# Patient Record
Sex: Male | Born: 1935 | Race: White | Hispanic: No | Smoking: Former smoker
Health system: Southern US, Community
[De-identification: ages and names within clinical notes are randomized; demographics above are authoritative.]

## PROBLEM LIST (undated history)

## (undated) DIAGNOSIS — S72001A Fracture of unspecified part of neck of right femur, initial encounter for closed fracture: Secondary | ICD-10-CM

## (undated) DIAGNOSIS — I714 Abdominal aortic aneurysm, without rupture, unspecified: Secondary | ICD-10-CM

## (undated) DIAGNOSIS — I10 Essential (primary) hypertension: Secondary | ICD-10-CM

## (undated) DIAGNOSIS — M1990 Unspecified osteoarthritis, unspecified site: Secondary | ICD-10-CM

## (undated) HISTORY — DX: Essential (primary) hypertension: I10

## (undated) HISTORY — DX: Unspecified osteoarthritis, unspecified site: M19.90

## (undated) HISTORY — DX: Abdominal aortic aneurysm, without rupture: I71.4

## (undated) HISTORY — DX: Abdominal aortic aneurysm, without rupture, unspecified: I71.40

---

## 2003-07-26 ENCOUNTER — Encounter (INDEPENDENT_AMBULATORY_CARE_PROVIDER_SITE_OTHER): Payer: Self-pay | Admitting: Specialist

## 2003-07-26 ENCOUNTER — Ambulatory Visit (HOSPITAL_COMMUNITY): Admission: RE | Admit: 2003-07-26 | Discharge: 2003-07-26 | Payer: Self-pay | Admitting: *Deleted

## 2003-09-17 ENCOUNTER — Ambulatory Visit (HOSPITAL_COMMUNITY): Admission: RE | Admit: 2003-09-17 | Discharge: 2003-09-18 | Payer: Self-pay | Admitting: *Deleted

## 2003-10-22 ENCOUNTER — Inpatient Hospital Stay (HOSPITAL_COMMUNITY): Admission: RE | Admit: 2003-10-22 | Discharge: 2003-10-29 | Payer: Self-pay | Admitting: Surgery

## 2003-10-22 ENCOUNTER — Encounter (INDEPENDENT_AMBULATORY_CARE_PROVIDER_SITE_OTHER): Payer: Self-pay | Admitting: *Deleted

## 2004-10-30 ENCOUNTER — Ambulatory Visit (HOSPITAL_COMMUNITY): Admission: RE | Admit: 2004-10-30 | Discharge: 2004-10-30 | Payer: Self-pay | Admitting: *Deleted

## 2008-09-06 ENCOUNTER — Inpatient Hospital Stay (HOSPITAL_COMMUNITY): Admission: EM | Admit: 2008-09-06 | Discharge: 2008-09-09 | Payer: Self-pay | Admitting: *Deleted

## 2008-09-07 ENCOUNTER — Encounter (INDEPENDENT_AMBULATORY_CARE_PROVIDER_SITE_OTHER): Payer: Self-pay | Admitting: *Deleted

## 2008-09-08 ENCOUNTER — Encounter (INDEPENDENT_AMBULATORY_CARE_PROVIDER_SITE_OTHER): Payer: Self-pay | Admitting: *Deleted

## 2008-09-09 ENCOUNTER — Encounter (INDEPENDENT_AMBULATORY_CARE_PROVIDER_SITE_OTHER): Payer: Self-pay | Admitting: Internal Medicine

## 2008-09-13 ENCOUNTER — Inpatient Hospital Stay (HOSPITAL_COMMUNITY): Admission: RE | Admit: 2008-09-13 | Discharge: 2008-09-22 | Payer: Self-pay | Admitting: Surgery

## 2008-09-13 ENCOUNTER — Encounter (INDEPENDENT_AMBULATORY_CARE_PROVIDER_SITE_OTHER): Payer: Self-pay | Admitting: Surgery

## 2008-10-13 ENCOUNTER — Ambulatory Visit: Payer: Self-pay | Admitting: Oncology

## 2008-10-20 LAB — COMPREHENSIVE METABOLIC PANEL
ALT: 13 U/L (ref 0–53)
AST: 18 U/L (ref 0–37)
Albumin: 3.3 g/dL — ABNORMAL LOW (ref 3.5–5.2)
Alkaline Phosphatase: 78 U/L (ref 39–117)
Calcium: 8.9 mg/dL (ref 8.4–10.5)
Chloride: 110 mEq/L (ref 96–112)
Creatinine, Ser: 1.26 mg/dL (ref 0.40–1.50)
Potassium: 4 mEq/L (ref 3.5–5.3)

## 2008-10-20 LAB — CBC WITH DIFFERENTIAL/PLATELET
BASO%: 0.8 % (ref 0.0–2.0)
EOS%: 4.2 % (ref 0.0–7.0)
MCH: 25 pg — ABNORMAL LOW (ref 27.2–33.4)
MCHC: 32.5 g/dL (ref 32.0–36.0)
RBC: 3.91 10*6/uL — ABNORMAL LOW (ref 4.20–5.82)
RDW: 24.7 % — ABNORMAL HIGH (ref 11.0–14.6)
lymph#: 2.6 10*3/uL (ref 0.9–3.3)

## 2009-02-11 ENCOUNTER — Ambulatory Visit: Payer: Self-pay | Admitting: Oncology

## 2009-02-15 ENCOUNTER — Ambulatory Visit (HOSPITAL_COMMUNITY): Admission: RE | Admit: 2009-02-15 | Discharge: 2009-02-15 | Payer: Self-pay | Admitting: Oncology

## 2009-02-15 LAB — CBC WITH DIFFERENTIAL/PLATELET
BASO%: 0.5 % (ref 0.0–2.0)
EOS%: 3.5 % (ref 0.0–7.0)
HCT: 41 % (ref 38.4–49.9)
LYMPH%: 41.7 % (ref 14.0–49.0)
MCH: 29.7 pg (ref 27.2–33.4)
MCHC: 33.7 g/dL (ref 32.0–36.0)
NEUT%: 45.8 % (ref 39.0–75.0)
Platelets: 259 10*3/uL (ref 140–400)
RBC: 4.65 10*6/uL (ref 4.20–5.82)
lymph#: 2.4 10*3/uL (ref 0.9–3.3)

## 2009-02-15 LAB — IRON AND TIBC
%SAT: 30 % (ref 20–55)
TIBC: 359 ug/dL (ref 215–435)

## 2009-02-15 LAB — COMPREHENSIVE METABOLIC PANEL
ALT: 14 U/L (ref 0–53)
AST: 19 U/L (ref 0–37)
Creatinine, Ser: 1.52 mg/dL — ABNORMAL HIGH (ref 0.40–1.50)
Sodium: 142 mEq/L (ref 135–145)
Total Bilirubin: 1 mg/dL (ref 0.3–1.2)
Total Protein: 7 g/dL (ref 6.0–8.3)

## 2009-02-15 LAB — FERRITIN: Ferritin: 38 ng/mL (ref 22–322)

## 2009-08-22 ENCOUNTER — Ambulatory Visit: Payer: Self-pay | Admitting: Oncology

## 2009-08-25 ENCOUNTER — Ambulatory Visit (HOSPITAL_COMMUNITY): Admission: RE | Admit: 2009-08-25 | Discharge: 2009-08-25 | Payer: Self-pay | Admitting: Oncology

## 2009-08-25 LAB — COMPREHENSIVE METABOLIC PANEL
ALT: 20 U/L (ref 0–53)
AST: 24 U/L (ref 0–37)
CO2: 28 mEq/L (ref 19–32)
Calcium: 9.5 mg/dL (ref 8.4–10.5)
Chloride: 109 mEq/L (ref 96–112)
Potassium: 5 mEq/L (ref 3.5–5.3)
Sodium: 141 mEq/L (ref 135–145)
Total Protein: 7.1 g/dL (ref 6.0–8.3)

## 2009-08-25 LAB — CBC WITH DIFFERENTIAL/PLATELET
BASO%: 0.7 % (ref 0.0–2.0)
EOS%: 4.5 % (ref 0.0–7.0)
HCT: 39.3 % (ref 38.4–49.9)
MCH: 30.9 pg (ref 27.2–33.4)
MCHC: 33.3 g/dL (ref 32.0–36.0)
MONO#: 0.5 10*3/uL (ref 0.1–0.9)
RBC: 4.24 10*6/uL (ref 4.20–5.82)
RDW: 13.3 % (ref 11.0–14.6)
WBC: 6 10*3/uL (ref 4.0–10.3)
lymph#: 2.2 10*3/uL (ref 0.9–3.3)

## 2009-08-25 LAB — LACTATE DEHYDROGENASE: LDH: 126 U/L (ref 94–250)

## 2010-03-06 ENCOUNTER — Ambulatory Visit: Payer: Self-pay | Admitting: Oncology

## 2010-03-06 ENCOUNTER — Ambulatory Visit (HOSPITAL_COMMUNITY): Admission: RE | Admit: 2010-03-06 | Discharge: 2010-03-06 | Payer: Self-pay | Admitting: Oncology

## 2010-03-06 LAB — CBC WITH DIFFERENTIAL/PLATELET
BASO%: 0.4 % (ref 0.0–2.0)
Basophils Absolute: 0 10*3/uL (ref 0.0–0.1)
Eosinophils Absolute: 0.3 10*3/uL (ref 0.0–0.5)
HCT: 38.3 % — ABNORMAL LOW (ref 38.4–49.9)
HGB: 12.4 g/dL — ABNORMAL LOW (ref 13.0–17.1)
LYMPH%: 31.2 % (ref 14.0–49.0)
MONO#: 0.5 10*3/uL (ref 0.1–0.9)
NEUT#: 3.2 10*3/uL (ref 1.5–6.5)
NEUT%: 55.4 % (ref 39.0–75.0)
Platelets: 260 10*3/uL (ref 140–400)
WBC: 5.7 10*3/uL (ref 4.0–10.3)
lymph#: 1.8 10*3/uL (ref 0.9–3.3)

## 2010-03-06 LAB — COMPREHENSIVE METABOLIC PANEL
ALT: 16 U/L (ref 0–53)
BUN: 25 mg/dL — ABNORMAL HIGH (ref 6–23)
CO2: 22 mEq/L (ref 19–32)
Calcium: 9.1 mg/dL (ref 8.4–10.5)
Chloride: 113 mEq/L — ABNORMAL HIGH (ref 96–112)
Creatinine, Ser: 1.53 mg/dL — ABNORMAL HIGH (ref 0.40–1.50)
Glucose, Bld: 103 mg/dL — ABNORMAL HIGH (ref 70–99)
Total Bilirubin: 0.5 mg/dL (ref 0.3–1.2)

## 2010-03-06 LAB — CEA: CEA: 1.7 ng/mL (ref 0.0–5.0)

## 2010-03-06 LAB — LACTATE DEHYDROGENASE: LDH: 115 U/L (ref 94–250)

## 2010-09-10 ENCOUNTER — Encounter: Payer: Self-pay | Admitting: Oncology

## 2010-09-14 ENCOUNTER — Ambulatory Visit: Payer: Self-pay | Admitting: Oncology

## 2010-09-18 ENCOUNTER — Other Ambulatory Visit: Payer: Self-pay | Admitting: Oncology

## 2010-09-18 ENCOUNTER — Ambulatory Visit (HOSPITAL_COMMUNITY)
Admission: RE | Admit: 2010-09-18 | Discharge: 2010-09-18 | Payer: Self-pay | Source: Home / Self Care | Attending: Oncology | Admitting: Oncology

## 2010-09-18 DIAGNOSIS — C20 Malignant neoplasm of rectum: Secondary | ICD-10-CM

## 2010-09-18 LAB — CBC WITH DIFFERENTIAL/PLATELET
Basophils Absolute: 0 10*3/uL (ref 0.0–0.1)
EOS%: 3.7 % (ref 0.0–7.0)
Eosinophils Absolute: 0.2 10*3/uL (ref 0.0–0.5)
LYMPH%: 32.9 % (ref 14.0–49.0)
MCH: 30.8 pg (ref 27.2–33.4)
MCV: 92 fL (ref 79.3–98.0)
MONO%: 7.7 % (ref 0.0–14.0)
NEUT#: 3.2 10*3/uL (ref 1.5–6.5)
Platelets: 253 10*3/uL (ref 140–400)
RBC: 4.2 10*6/uL (ref 4.20–5.82)
RDW: 12.9 % (ref 11.0–14.6)

## 2010-09-18 LAB — COMPREHENSIVE METABOLIC PANEL
Alkaline Phosphatase: 85 U/L (ref 39–117)
CO2: 24 mEq/L (ref 19–32)
Creatinine, Ser: 1.48 mg/dL (ref 0.40–1.50)
Glucose, Bld: 105 mg/dL — ABNORMAL HIGH (ref 70–99)
Sodium: 142 mEq/L (ref 135–145)
Total Bilirubin: 0.6 mg/dL (ref 0.3–1.2)

## 2010-09-18 LAB — LACTATE DEHYDROGENASE: LDH: 124 U/L (ref 94–250)

## 2010-12-04 LAB — CROSSMATCH
Antibody Screen: NEGATIVE
Antibody Screen: NEGATIVE

## 2010-12-04 LAB — CBC
HCT: 19.6 % — ABNORMAL LOW (ref 39.0–52.0)
HCT: 28.2 % — ABNORMAL LOW (ref 39.0–52.0)
HCT: 34.8 % — ABNORMAL LOW (ref 39.0–52.0)
Hemoglobin: 6 g/dL — CL (ref 13.0–17.0)
Hemoglobin: 11 g/dL — ABNORMAL LOW (ref 13.0–17.0)
Hemoglobin: 11.2 g/dL — ABNORMAL LOW (ref 13.0–17.0)
Hemoglobin: 9.6 g/dL — ABNORMAL LOW (ref 13.0–17.0)
MCHC: 32.1 g/dL (ref 30.0–36.0)
MCHC: 31.5 g/dL (ref 30.0–36.0)
MCHC: 31.5 g/dL (ref 30.0–36.0)
MCV: 74 fL — ABNORMAL LOW (ref 78.0–100.0)
MCV: 74.6 fL — ABNORMAL LOW (ref 78.0–100.0)
MCV: 75.4 fL — ABNORMAL LOW (ref 78.0–100.0)
MCV: 76 fL — ABNORMAL LOW (ref 78.0–100.0)
Platelets: 335 10*3/uL (ref 150–400)
Platelets: 349 10*3/uL (ref 150–400)
Platelets: 375 10*3/uL (ref 150–400)
Platelets: 325 10*3/uL (ref 150–400)
Platelets: 405 10*3/uL — ABNORMAL HIGH (ref 150–400)
RBC: 3.91 MIL/uL — ABNORMAL LOW (ref 4.22–5.81)
RBC: 4.01 MIL/uL — ABNORMAL LOW (ref 4.22–5.81)
RBC: 4.64 MIL/uL (ref 4.22–5.81)
RDW: 22.7 % — ABNORMAL HIGH (ref 11.5–15.5)
RDW: 23 % — ABNORMAL HIGH (ref 11.5–15.5)
RDW: 25.3 % — ABNORMAL HIGH (ref 11.5–15.5)
WBC: 5.6 10*3/uL (ref 4.0–10.5)
WBC: 5.6 10*3/uL (ref 4.0–10.5)
WBC: 5.9 10*3/uL (ref 4.0–10.5)
WBC: 6.2 10*3/uL (ref 4.0–10.5)
WBC: 7.9 10*3/uL (ref 4.0–10.5)
WBC: 9.4 10*3/uL (ref 4.0–10.5)

## 2010-12-04 LAB — COMPREHENSIVE METABOLIC PANEL
Alkaline Phosphatase: 92 U/L (ref 39–117)
BUN: 23 mg/dL (ref 6–23)
CO2: 22 mEq/L (ref 19–32)
Chloride: 110 mEq/L (ref 96–112)
GFR calc non Af Amer: 41 mL/min — ABNORMAL LOW (ref >60)
Glucose, Bld: 106 mg/dL — ABNORMAL HIGH (ref 70–99)
Potassium: 4.6 mEq/L (ref 3.5–5.1)
Total Bilirubin: 0.3 mg/dL (ref 0.3–1.2)

## 2010-12-04 LAB — BASIC METABOLIC PANEL
BUN: 11 mg/dL (ref 6–23)
BUN: 18 mg/dL (ref 6–23)
BUN: 21 mg/dL (ref 6–23)
BUN: 11 mg/dL (ref 6–23)
BUN: 15 mg/dL (ref 6–23)
CO2: 26 mEq/L (ref 19–32)
CO2: 24 mEq/L (ref 19–32)
CO2: 24 mEq/L (ref 19–32)
Calcium: 8.8 mg/dL (ref 8.4–10.5)
Calcium: 8.9 mg/dL (ref 8.4–10.5)
Calcium: 9.3 mg/dL (ref 8.4–10.5)
Chloride: 106 mEq/L (ref 96–112)
Chloride: 109 mEq/L (ref 96–112)
Chloride: 107 mEq/L (ref 96–112)
Chloride: 107 mEq/L (ref 96–112)
Creatinine, Ser: 1.39 mg/dL (ref 0.4–1.5)
Creatinine, Ser: 1.57 mg/dL — ABNORMAL HIGH (ref 0.4–1.5)
Creatinine, Ser: 1.36 mg/dL (ref 0.4–1.5)
Creatinine, Ser: 1.36 mg/dL (ref 0.4–1.5)
Creatinine, Ser: 1.38 mg/dL (ref 0.4–1.5)
GFR calc Af Amer: 59 mL/min — ABNORMAL LOW (ref >60)
GFR calc Af Amer: 60 mL/min — ABNORMAL LOW (ref >60)
GFR calc Af Amer: 60 mL/min — ABNORMAL LOW (ref >60)
GFR calc non Af Amer: 48 mL/min — ABNORMAL LOW (ref >60)
GFR calc non Af Amer: 50 mL/min — ABNORMAL LOW (ref >60)
GFR calc non Af Amer: 52 mL/min — ABNORMAL LOW (ref >60)
Glucose, Bld: 93 mg/dL (ref 70–99)
Glucose, Bld: 129 mg/dL — ABNORMAL HIGH (ref 70–99)
Potassium: 4 mEq/L (ref 3.5–5.1)
Potassium: 3.5 mEq/L (ref 3.5–5.1)
Sodium: 137 mEq/L (ref 135–145)
Sodium: 137 mEq/L (ref 135–145)
Sodium: 138 mEq/L (ref 135–145)

## 2010-12-04 LAB — DIFFERENTIAL
Basophils Absolute: 0.1 10*3/uL (ref 0.0–0.1)
Lymphocytes Relative: 24 % (ref 12–46)
Monocytes Relative: 7 % (ref 3–12)
Neutro Abs: 5.1 10*3/uL (ref 1.7–7.7)
Neutrophils Relative %: 65 % (ref 43–77)

## 2010-12-04 LAB — PROTIME-INR
INR: 1.1 (ref 0.00–1.49)
Prothrombin Time: 14.5 seconds (ref 11.6–15.2)

## 2010-12-04 LAB — CEA: CEA: 0.8 ng/mL (ref 0.0–5.0)

## 2010-12-04 LAB — ABO/RH: ABO/RH(D): O POS

## 2010-12-04 LAB — HEMOCCULT GUIAC POC 1CARD (OFFICE): Fecal Occult Bld: POSITIVE

## 2010-12-04 LAB — HEMOGLOBIN AND HEMATOCRIT, BLOOD: Hemoglobin: 9.2 g/dL — ABNORMAL LOW (ref 13.0–17.0)

## 2011-01-02 NOTE — H&P (Signed)
NAMEJONIEL, GRAUMANN                 ACCOUNT NO.:  [id number]   MEDICAL RECORD NO.:  [id number]          PATIENT TYPE:  INP   LOCATION:  1524                         FACILITY:  [facility]   PHYSICIAN:  Della Goo, M.D. DATE OF BIRTH:  07-21-1919   DATE OF ADMISSION:  09/06/2008  DATE OF DISCHARGE:                              HISTORY & PHYSICAL   PRIMARY CARE PHYSICIAN:  Georgiana Spinner, M.D.   CHIEF COMPLAINT:  Fatigue for 4 months.   HISTORY OF THE PRESENT ILLNESS:  This is an 77 year old male who was  seen in the office of Dr. Sabino Gasser and sent to the hospital for direct  admission after being evaluated and found to have a hemoglobin level of  6.  The patient states that he has had fatigue, dyspnea on exertion for  the past 4 months and also reports having spells of diarrhea off and on  lasting 3 days at a time since Thanksgiving.  The patient does report  having lightheadedness.  He denies having any nausea or vomiting.  He  reports he is also had bouts of constipation on between.  The patient  also states that he has had good health and been in good condition,  walking several miles daily up until 4 months ago.   PAST MEDICAL HISTORY:  The patient has a history of hearing impairment  bilaterally and wears hearing aids.  Also he has a history of colon  polyps status post colon resection and removal of polyps; this was  performed and March 2005.   MEDICATIONS:  Medications at this time include lisinopril 10 mg 1 by  mouth every day.   ALLERGIES:  No known drug allergies.   SOCIAL HISTORY:  The patient is a nonsmoker and he rarely drinks  alcohol.   FAMILY HISTORY:  The family history is noncontributory.   REVIEW OF SYSTEMS:  The review of systems pertinents are mentioned  above.  The patient denies having any chest pain or shortness of breath.  He denies having any dysuria.  He cannot comment on the color and/or  consistency of his stools, but does report that they have  been loose and  diarrhea.  The patient denies having any pain with his loose stools.   PHYSICAL EXAMINATION:  GENERAL APPEARANCE:  On physical examination  findings this is an 77 year old well-nourished, well-developed male in  no discomfort or acute distress.  VITAL SIGNS:  The patient's vital signs are stable.  He is afebrile.  HEENT EXAMINATION:  Normocephalic and atraumatic head.  Pupils are  equally round and react to light.  Extraocular movements are intact.  Funduscopic is benign.  Oropharynx is clear.  NECK:  The neck is supple with full range of motion.  No thyromegaly,  adenopathy or jugulovenous distention.  HEART:  Cardiovascular - regular rate and rhythm.  No murmurs, gallops  or rubs.  LUNGS:  The lungs are clear to auscultation bilaterally.  ABDOMEN:  The abdomen has positive bowel sounds.  It is soft, nontender  and nondistended.  No hepatosplenomegaly.  No guarding.  EXTREMITIES:  The  extremities are without cyanosis, clubbing or edema.  NEUROLOGIC:  On neurological examination there is mild generalized  weakness, but otherwise nonfocal, except for the hearing impairment.   ASSESSMENT:  The patient is an 77 year old male being admitted with:  1. Anemia possibly secondary to an occult bleed or due to passage of      melena intermittently.  2. Dyspnea on exertion.  3. Fatigue.   PLAN:  1. The patient will be admitted and placed on IV fluids, and      transfused 3 units of packed red blood cells; 2 units will also be      placed on hold.  2. Admission lab studies will be performed, which include a full CBC,      CMET and coag studies.  3. The patient will be placed on hemoglobin checks every 6 hours times      48 hours.  4. The patient will be placed on clear liquids for now and IV fluids      for fluid resuscitation.  5. The patient will be seen by Dr. Sabino Gasser for further evaluation.  6. The patient will be placed on GI prophylaxis of IV Protonix and      SCDs  have been ordered for DVT prophylaxis      Della Goo, M.D.  Electronically Signed     HJ/MEDQ  D:  09/07/2008  T:  09/08/2008  Job:  [id number]

## 2011-01-02 NOTE — Op Note (Signed)
Garrett Snow, Garrett Snow                 ACCOUNT NO.:  [id number]   MEDICAL RECORD NO.:  [id number]          PATIENT TYPE:  INP   LOCATION:  1524                         FACILITY:  [facility]   PHYSICIAN:  Georgiana Spinner, M.D.    DATE OF BIRTH:  [birth year]   DATE OF PROCEDURE:  09/07/2008  DATE OF DISCHARGE:                               OPERATIVE REPORT   PROCEDURE:  Upper endoscopy.   INDICATIONS:  GI bleeding with hypochromic, macrocytic anemia.   ANESTHESIA:  Fentanyl 50 mcg, Versed 5 mg.   PROCEDURE:  With the patient mildly sedated in the left lateral  decubitus position, the Pentax videoscopic endoscope was inserted in the  mouth and passed under direct vision through the esophagus which  appeared normal into the stomach.  Fundus, body, antrum appeared normal.  Duodenal bulb was entered and as we insufflated this area there was a  single AVM seen.  We were able to advance past this into the second  portion of duodenum where on pullback we noticed numerous white  irregular shaped ulcers, some linear, all with white bases, no markings  of recent bleed.  The endoscope was pulled back into stomach.  Biopsy  taken.  Endoscope was placed in retroflexion to view the stomach from  below.  The endoscope was then straightened and withdrawn, taking  circumferential views of gastric and esophageal mucosa.  The patient's  vital signs, pulse oximeter remained stable.  The patient tolerated  procedure well without apparent complications.   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  Await biopsy report.  The patient on PPI therapy and has been transfused.  Await clinical  response.  Will proceed to colonoscopy because of the patient's history  of having had resection of a benign polyp with high-grade dysplasia not  quite 5 years ago.           ______________________________  Georgiana Spinner, M.D.     GMO/MEDQ  D:   09/07/2008  T:  09/07/2008  Job:  [id number]

## 2011-01-02 NOTE — Op Note (Signed)
NAMEARSHAD, OBERHOLZER                 ACCOUNT NO.:  [id number]   MEDICAL RECORD NO.:  [id number]          PATIENT TYPE:  INP   LOCATION:  1524                         [facility]ACILITY[facility]  [facility]   PHYSICIAN:  Georgiana Spinner, M.D.    DATE OF BIRTH:  [birth year]   DATE OF PROCEDURE:  DATE OF DISCHARGE:                               OPERATIVE REPORT   PROCEDURE:  Colonoscopy.   INDICATIONS:  Rectal bleeding.   ANESTHESIA:  Fentanyl 40 mcg, Versed 4 mg.   PROCEDURE IN DETAIL:  With the patient mildly sedated in the left  lateral decubitus position the Pentax videoscopic pediatric colonoscope  was inserted in the rectum after normal rectal exam and passed under  direct vision to the ascending colon/cecum.  It appeared that we reached  the cecum and there was a large mass in the cecum which was felt to most  likely be a cancer.  This was photographed and multiple biopsies were  taken and I injected 2 mL of [place] ink adjacent to the site to localize  it in for potential surgical extirpation.  From this point the  colonoscope was slowly withdrawn taking circumferential views of colonic  mucosa stopping only in the rectum which appeared normal on direct,  showed hemorrhoids on retroflexed view.  The endoscope was straightened  and withdrawn.  The patient's vital signs, pulse oximeter remained  stable.  The patient tolerated the procedure well without apparent  complications.   FINDINGS:  Probable cancer of ascending colon/cecum biopsied and  tattooed, await biopsy report.  The patient will follow up with me and  we will call surgery for their evaluation.           ______________________________  Georgiana Spinner, M.D.     GMO/MEDQ  D:  09/08/2008  T:  09/08/2008  Job:  [id number]

## 2011-01-02 NOTE — Op Note (Signed)
NAMENUMAIR, MASDEN                 ACCOUNT NO.:  [id number]   MEDICAL RECORD NO.:  [id number]          PATIENT TYPE:  INP   LOCATION:  2603                         FACILITY:  [facility]   PHYSICIAN:  Currie Paris, M.D.DATE OF BIRTH:  [birth year]   DATE OF PROCEDURE:  09/13/2008  DATE OF DISCHARGE:                               OPERATIVE REPORT   PREOPERATIVE DIAGNOSES:  1. Carcinoma of the ascending colon.  2. Small incisional hernias.   POSTOPERATIVE DIAGNOSES:  1. Carcinoma of the ascending colon.  2. Small incisional hernias.   OPERATION:  Right hemicolectomy with closure incisional hernias.   SURGEON:  Currie Paris, MD   ASSISTANT:  Juanetta Gosling, MD   ANESTHESIA:  General.   CLINICAL HISTORY:  This is an 77 year old gentleman who underwent a  transverse colectomy about 5 years ago for a large transverse colon  polyp that was benign but with dysplasia.  He now presented with anemia  and was found to have a large cecal carcinoma.  There was some apparent  obstruction of the appendix as that was quite dilated.  CT scan also  showed a loop of thickened small bowel of uncertain etiology.  On  physical, he had a couple of small asymptomatic incisional hernias in  the upper midline from his prior colectomy.   DESCRIPTION OF PROCEDURE:  The patient was seen in the holding area and  we confirmed the plans for the procedure as noted above.  He was taken  to the operating room after satisfactory general endotracheal anesthesia  had been obtained.  The abdomen was clipped, prepped and draped.  A  Foley catheter was placed.  The time-out was done.   I used the old midline incision and extended a little bit inferiorly.  There was some omental adhesions that were taken down to the anterior  midline and some more to the sigmoid colon which was fairly redundant.   On doing this, we were able to palpate a large mass in the cecum  compatible with the tumor seen on  endoscopy.  There was also some black  ink staining from where they had tattooed the area.  It appeared to be  mobile.   I took down some more omental adhesions so we had free place to work.  I  placed a wound protector.   I began by mobilizing the hepatic flexure using either cautery or the  LigaSure.  I was able to free up the entire line of Toldt and mobilized  the colon well up into the wound.  The small bowel was tethered at the  terminal ileum and this was freed up as well.  We saw the ureter and it  was well posterior.  I could see the old anastomosis and I made plans to  divide the transverse colon just distal to the old anastomosis and the  small bowel just proximal to the ileocecal valve.  I then scored the  mesentery and divided the mesentery primarily with the LigaSure,  although larger vessels were tied with 2-0 silk in addition to the  LigaSure.  When I had the mesentery completely divided, I tacked the  antimesenteric borders of the small bowel together and then opened the  small bowel and colon and inserted the GIA and fired it.  This produced  a side-to-side anastomosis.  The common defect was closed with a TA-60  transversely and the specimen cut off.  This produced a nice patent  anastomosis with no tension.   I used new instruments and gloves.  We close the defect in the  mesentery.  I ran the small bowel twice and it appeared to be completely  normal.  I think that the thickened small bowel seen on CT must have  represented a peristaltic loop.  There was no evidence of metastatic  disease noted.   The abdomen was irrigated copiously.  I made a final check for  hemostasis and everything appeared to be dry.  The midline was closed  with a running looped 0 PDS with interrupted #1 Novafil sutures.  The  wound was irrigated and closed with staples and Telfa wicks were also  placed.   The patient tolerated the procedure well and there were no  complications.   Estimated blood loss was less than 100 mL.  All counts  were correct.      Currie Paris, M.D.  Electronically Signed     CJS/MEDQ  D:  09/13/2008  T:  09/13/2008  Job:  [id number]   cc:   Georgiana Spinner, M.D.  [facility]

## 2011-01-02 NOTE — Consult Note (Signed)
NAMELEDON, WEIHE                 ACCOUNT NO.:  [id number]   MEDICAL RECORD NO.:  [id number]          PATIENT TYPE:  INP   LOCATION:  NA                           FACILITY:  [facility]   PHYSICIAN:  Currie Paris, M.D.DATE OF BIRTH:  1919/02/23   DATE OF CONSULTATION:  09/08/2008  DATE OF DISCHARGE:                                 CONSULTATION   REASON FOR CONSULTATION:  Colon cancer.   CHIEF COMPLAINT:  Fatigue.   HISTORY OF PRESENT ILLNESS:  Mr. Katrinka Blazing was admitted to the hospital on  01/18.  He had presented to Dr. Wende Neighbors office with a history of some  intermittent diarrhea starting back in Thanksgiving, followed by fatigue  and dyspnea on exertion and some lightheadedness.  He was having no  nausea or vomiting.  He otherwise has been in stable health, walking  several miles a day until he began to get sick a few months ago.   Because of his symptoms he was admitted.  He was found to have a  hemoglobin of 6.  He has been transfused 3 units.  His hemoglobin has  come up to 9.   Currently he is asymptomatic.  He just had a colonoscopy which showed a  large lesion in his cecum.  The.  Patient is not having any pain.   PAST MEDICAL/SURGICAL HISTORY:  1. I did a transverse colectomy on him about 45 years ago for a large      benign polyp of the transverse colon.  This was I believe in      10/2003.  2. In 2006 he had a colonoscopy which was negative and that was his      last colonoscopy.   MEDICATIONS:  Lisinopril 10 mg daily.   ALLERGIES:  None.   SOCIAL HISTORY:  The patient lives alone.  He is a nonsmoker and seldom  drinks.   FAMILY HISTORY:  Family history is noted and in the chart here.   REVIEW OF SYSTEMS:  Other than what is noted above he has no other  symptoms.  He does note he has been followed by a cardiologist.   PHYSICAL EXAMINATION:  VITAL SIGNS:  Noted in the chart and not  redictated here but have basically been stable.  GENERAL:  He is alert, oriented  and but otherwise healthy-appearing.  He  seems oriented x3.  HEENT:  Head is normocephalic.  Pupils round and regular.  Nonicteric.  NECK:  Supple with no masses or thyromegaly.  LUNGS:  Normal respirations.  Clear to auscultation.  HEART:  Regular rhythm.  I do not appreciate any large loud murmurs.  ABDOMEN:  Soft and benign.  It is nontender.  There are no masses.  There is a small incisional hernia in the upper midline.  EXTREMITIES:  No cyanosis or edema.   DATA REVIEWED:  I looked over the notes in the chart etc.   IMPRESSION:  1. Ascending colon cancer.  There was no evidence clear metastases.  2. Anemia secondary to bleeding from ascending colon cancer.  3. History of polyp.  4. History  of sinus bradycardia.   PLAN:  At this point he needs a right colectomy for his colon cancer.  I  think that we should obtain a cardiac evaluation prior to surgery and he  has been seen by the cardiologist, [facility], so I have  contacted then and asked them to check on this patient.  I would like to  have a CT scan done preoperatively just to assess for other issues such  as metastases.  Assuming these are negative I think he could be  discharged and brought back for a semi-elective surgery the first of  next week.  Our first operating room time available would be Monday  (today is Wednesday afternoon).   I have I discussed this with the patient and his family.  I think they  understand the plans.  I have contacted [facility] and will contact  his primary care physicians.  I have also spoken with Dr. Virginia Rochester.   I think all questions about the plans have been answered.      Currie Paris, M.D.  Electronically Signed     CJS/MEDQ  D:  09/08/2008  T:  09/08/2008  Job:  [id number]   cc:   Georgiana Spinner, M.D.  Fax: [phone]

## 2011-01-02 NOTE — Discharge Summary (Signed)
NAMEHARLAN, Garrett Snow                 ACCOUNT NO.:  [id number]   MEDICAL RECORD NO.:  [id number]          PATIENT TYPE:  INP   LOCATION:  1422                         FACILITY:  [facility]   PHYSICIAN:  Hillery Aldo, M.D.   DATE OF BIRTH:  20-Mar-1919   DATE OF ADMISSION:  09/06/2008  DATE OF DISCHARGE:  09/09/2008                               DISCHARGE SUMMARY   PRIMARY CARE PHYSICIAN:  Dr. Sabino Gasser.   SURGEON:  Dr. Jamey Ripa.   DISCHARGE DIAGNOSES:  1. 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  2. 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  3. Hypertension.  4. Hearing impairment.  5. Chronic renal insufficiency.  6. Asymptomatic bradycardia.  7. Chronic obstructive pulmonary disease by radiographic criteria.  8. Nonobstructive left renal calculus.  9. Duodenal ulcer.   DISCHARGE MEDICATIONS:  1. Lisinopril 10 mg daily.  2. Discharge medication Protonix 40 mg daily.   CONSULTATIONS:  1. Dr. Virginia Rochester of gastroenterology  2. Dr. Jamey Ripa of general surgery.   BRIEF ADMISSION HPI:  The patient is an 77 year old male who was seen by  Dr. Virginia Rochester in his office and referred to the hospital for direct admission  after being evaluated and found to have a hemoglobin of 6.  The patient  endorsed symptoms of fatigue, dyspnea on exertion, and intermittent  changes in his bowel habits including diarrhea.  The patient was  admitted for further evaluation and workup.  For the full details,  please see the dictated report done by Dr. Lovell Sheehan.   PROCEDURES AND DIAGNOSTIC STUDIES.:  1. Acute abdominal series on September 06, 2008 showed prominent small      bowel loops in the right mid abdomen, question early or partial      small bowel obstruction.  Tiny nonobstructing left renal calculus.      Probable chronic obstructive pulmonary disease.  2. Upper endoscopy on an September 07, 2008 showed a duodenal ulcers and      an.  3. Colonoscopy on September 08, 2008  showed a probable cancer of the      colon and cecum which was biopsied and tattooed.  4. CT scan of the abdomen and pelvis on September 08, 2008 showed a      large carcinoma of the cecum with an obstruction of the appendix      and some infiltration around the tumor suggesting tumor extension      beyond the bowel wall.  Thickened small bowel loop in the pelvis      which may be due to metastasis or inflammatory bowel disease.  5. A two-dimensional echocardiogram on September 09, 2008:  Results      pending but will be reviewed by Dr. Patty Sermons or Dr. Elsie Lincoln prior      to surgery.   DISCHARGE LABORATORY VALUES:  Sodium was 140, potassium 3.8, chloride  111, bicarb 23, BUN 11, creatinine 1.39, glucose 85.  White blood cell  count was 5.6, hemoglobin 9.3, hematocrit 29.2, platelets 335,000.   HOSPITAL COURSE:  1.  Chronic blood loss anemia secondary to cecal mass:  The patient was      admitted and serial hemoglobins were checked.  The patient      underwent evaluation with upper and lower endoscopies and was noted      to have duodenal ulcers as well as a large cecal mass.  The      patient's chronic blood loss was addressed with transfusion of 4      units of packed red blood cells.  Plan is to complete a right      colectomy on September 13, 2008.  He was put on Protonix for      treatment of his duodenal ulcers as well.  The patient is      hemodynamically stable and will be discharged with plans to readmit      him on September 13, 2008 for elective right colectomy for treatment      of his cecal carcinoma.  2. Hypertension:  The patient's lisinopril was initially held due to      hypotension.  Once he was hemodynamically stable, the lisinopril      was restarted when his blood pressure became suboptimal.  3. Chronic renal insufficiency:  The patient does have chronic renal      insufficiency and ongoing blood pressure control was recommended.  4. Asymptomatic bradycardia:  The patient  has been evaluated by      cardiology in the past.  He has seen Dr. Patty Sermons and his records      were obtained.  A 12-lead EKG done in the hospital did show sinus      bradycardia with right bundle branch block and a left anterior      fascicular block.  A 12-lead EKG done during this hospital stay was      compared with EKGs done on prior evaluations and they were not      found to have any significant differences.  Nevertheless,      cardiology was consulted for preoperative clearance.  The patient      was evaluated with a two-dimensional echocardiogram.  The results      of this study are currently pending.  The patient is not on any      rate controlling medications and is asymptomatic.   DISPOSITION:  The patient is medically stable and will be discharged  home.  He is instructed to return to [facility] a 6:30 a.m. for  a planned surgical intervention at 7:30 a.m. and to not eat or drink  anything after midnight on September 12, 2008.   Time spent coordinating care for discharge and discharge instructions  equals 35 minutes.      Hillery Aldo, M.D.  Electronically Signed     CR/MEDQ  D:  09/09/2008  T:  09/09/2008  Job:  [id number]   cc:   Georgiana Spinner, M.D.  Fax: [phone]   Currie Paris, M.D.  [address], [address]  [place]  [place] [address]

## 2011-01-05 NOTE — Op Note (Signed)
NAME:  Garrett Snow, Garrett Snow                           ACCOUNT NO.:  [id number]   MEDICAL RECORD NO.:  [id number]                   PATIENT TYPE:  AMB   LOCATION:  ENDO                                 FACILITY:  [facility]   PHYSICIAN:  Georgiana Spinner, M.D.                 DATE OF BIRTH:  1919-02-28   DATE OF PROCEDURE:  07/26/2003  DATE OF DISCHARGE:                                 OPERATIVE REPORT   PROCEDURE:  Colonoscopy.   INDICATIONS:  Colon polyps, Hemoccult positivity.   ANESTHESIA:  Demerol 10 mg, Versed 0.5 mg.   DESCRIPTION OF PROCEDURE:  With the patient mildly sedated in the left  lateral decubitus position, the Olympus videoscopic colonoscope was inserted  into the rectum and passed under direct vision to the cecum, identified by  the ileocecal valve and appendiceal orifice, the former of which was  photographed.  From this point the colonoscope was slowly withdrawn taking  circumferential views of the colonic mucosa, stopping in the proximal  ascending colon, where a large polyp was seen, and photographed, and  initially we did biopsies to obtain tissue because of the size of this, this  was multi-centimeter.  Then using snare cautery technique, setting of  20/200, with the ERBE pulse generator, we snared off two large portions of  the polyp; however, there was still quite a bit of polypoid tissue  remaining, but my concern at this point was that it may be malignant and I  elected to take off large portions for pathology so that an adequate  assessment of this could be done.  Once this was accomplished, this tissue  was placed in the [facility] retrieval basket and the endoscope and the polypoid  tissue was removed as we took circumferential views of the remaining colonic  mucosa.  We encountered a polyp at 15 cm from the anal verge.  After  removing the endoscope and retrieving the tissue from the basket, we  reinserted the endoscope proximal to this, actually to the splenic flexure,  and we were able to take circumferential views of the remaining colonic  mucosa, stopping again at 15 cm from the anal verge, at which point the  large polyp was once again encountered, photographed, and was removed using  again snare cautery technique, setting of 20/200 on the ERBE argon photo  coagulator pulse generator.  Tissue was retrieved for pathology again using  the [facility] retrieval unit.  The endoscope was then after retrieving tissue  reinserted into the rectum, which appeared normal on direct and retroflexed  view.  The endoscope was straightened and withdrawn.  The patient's vital  signs remained stable.  The patient tolerated the procedure well without  apparent complications.   FINDINGS:  Two large polypoid lesions in the colon, one at 15 cm from the  anal verge, which was removed in its entirety, the other was at the proximal  transverse colon just distal to the hepatic flexure.  This was really  removed partially, quite a bit of tissue was removed and quite a bit of  tissue remains, a number of centimeters.   PLAN:  Will again have the patient hold his aspirin, see endoscopy note  regarding that, and await biopsy report.  The patient will call me for  results and will follow up as an outpatient.                                               Georgiana Spinner, M.D.    GMO/MEDQ  D:  07/26/2003  T:  07/26/2003  Job:  [id number]

## 2011-01-05 NOTE — Op Note (Signed)
NAME:  Garrett Snow, Garrett Snow                           ACCOUNT NO.:  [id number]   MEDICAL RECORD NO.:  [id number]                   PATIENT TYPE:  INP   LOCATION:  0001                                 FACILITY:  [facility]   PHYSICIAN:  Currie Paris, M.D.           DATE OF BIRTH:  02-23-1919   DATE OF PROCEDURE:  10/22/2003  DATE OF DISCHARGE:                                 OPERATIVE REPORT   CCS#:  [id number]   PREOPERATIVE DIAGNOSES:  Large transverse colon polyp with dysplasia.   POSTOPERATIVE DIAGNOSES:  Large transverse colon polyp with dysplasia.   OPERATION:  Transverse colectomy with primary anastomosis.   SURGEON:  Currie Paris, M.D.   ASSISTANT:  Jimmye Norman, M.D.   ANESTHESIA:  General endotracheal.   CLINICAL COURSE:  This patient is an 77 year old gentleman who recently was  found to have some anemia and on workup was found to have a large mid  transverse colon polyp that although was not clearly malignant did have some  atypia with high grade dysplasia. It was not though to be resectable through  the colonoscope.   DESCRIPTION OF PROCEDURE:  The patient was seen in the holding area and had  no further questions.  He had a colonoscopy earlier and tattoo of the lesion  to make sure we could find it.   He was taken to the operating room and after satisfactory general  endotracheal anesthesia had been obtained, a Foley catheter was placed and  the abdomen prepped and draped. A midline upper abdominal incision was made  going to just below the umbilicus.  The transverse colon was identified and  was markedly distended with air as was the right cecum. There was in fact a  small cecal splitting of the tenia.  The transverse colon was tethered to  the ascending colon and some omental adhesions were taken down to free this  up. The polyp was readily identified once we were able to palpate in the  area of the tattooing. I mobilized the hepatic flexure using the cautery  to  free this up so we had the entire transverse colon up where we would work on  it. The polyp itself felt fairly soft and felt that the most appropriate  thing was a simple partial colectomy to resect this section of transverse  colon.  I made a window in the mesentery and divided down towards the bottom  of the mesentery with clamps and then across and then back up so that we had  about a 5-7 cm margin on either side of the tumor. The omentum was cleaned  off of the colon at the site where we were going to divide it both  proximally and distally.  Allen then Kocher clamps were placed across  proximally and distally, the colon divided and the segment resected. We used  some spring clamps and then took the Yantis clamp off first  at the proximal  side, decompressed the air out of there and then the distal and likewise  decompressed the air.  A stapled anastomosis using the TA-60 stapler with  triangulation technique was made.  I put three sutures along the back wall,  tied them down and then fired the stapler.  The anterior wall was divided in  half with the suture and then the two remaining segments stapled closed  using a third suture for the middle of each staple line. This produced a  nice 2-3 fingerbreadth anastomosis as the colon was serially dilated from  the recent colonoscopy.  There was no bleeding and the mesentery appeared  okay and we had excellent blood supply and it all appeared healthy.  The  mesenteric defect was closed using 3-0 silk. Gloves and instruments were  changed. Brief palpation of the abdominal cavity revealed no gross  abnormalities. The sigmoid colon was noted to be quite tortuous and still  filled with some air but no evidence of any other problems.   The abdomen was closed with a running #1 PDS on the fascia. The wound was  irrigated and the skin was closed with staples.   The patient tolerated the procedure well. There were no operative  complications.   All counts were correct. The estimated blood loss was less  than 100 mL.                                               Currie Paris, M.D.    CJS/MEDQ  D:  10/22/2003  T:  10/22/2003  Job:  [id number]   cc:   Georgiana Spinner, M.D.  [address]  [place]  [place] [address]  Fax: [phone]   Maxwell Caul, M.D.  [address]  [place]  [place] [address]  Fax: [phone]

## 2011-01-05 NOTE — Cardiovascular Report (Signed)
[name], [name]                           ACCOUNT NO.:  [id number]   MEDICAL RECORD NO.:  [id number]                   PATIENT TYPE:  OIB   LOCATION:  2013                                 FACILITY:  [facility]   PHYSICIAN:  Garrett Snow, M.D.             DATE OF BIRTH:  23-Jun-1919   DATE OF PROCEDURE:  09/17/2003  DATE OF DISCHARGE:  09/18/2003                              CARDIAC CATHETERIZATION   INDICATION:  Garrett Snow is an 77 year old male patient of [name],  with a history of chronic bradycardia which is asymptomatic.  He was  recently noted to have a large colonic polyp, and he is to undergo partial  colectomy.  In preparation for that, he underwent Cardiolite scan, revealed  mild inferior wall ischemia.  He is now for cardiac catheterization to rule  out significant CAD as part of preoperative preparation.   PROCEDURES:  1. Left heart catheterization.  2. Coronary angiography.  3. Left ventriculogram.   ATTENDING:  Dr. Lenise Herald.   COMPLICATIONS:  None.   DESCRIPTION OF PROCEDURE:  Following informed consent, the patient was  brought to the cardiac cath lab.  Right and left groin are shaved, prepped,  and draped in the usual sterile fashion.  ECG monitor established.  Using  modified Seldinger technique, #6 French arterial sheath inserted in right  femoral artery.  A 6 French diagnostic catheter was used to perform  diagnostic angiography.  This reveals a long left main with no evidence of  disease.  LAD is a large vessel, courses to the apex, gives rise to two  diagonal branches.  The LAD is noted to have some mild 50% mid vessel  lesions.  First diagonal was a small vessel with 50% ostial lesion.  The  second diagonal is a small vessel with significant disease.   Left coronary artery gives rise to a medium size ramus intermedius with no  significant disease.   Left circumflex is a medium sized vessel that courses in the AV groove and  gives rise  to one obtuse marginal branch.  Circumflex is noted to be  coursing ___________, but has no high-grade stenosis.  The first OM is a  large vessel that bifurcates distally, has no significant disease.   The right coronary artery is dominant, gives rise to both PDA/posterolateral  branch.  The RCA is noted to have 50% proximal, 30% mid, and diffuse  irregular areas to the distal RCA.  PDA and posterolateral branch have no  significant disease.   Left ventriculogram reveals a preserved EF of 50% with mild anterolateral  hypokinesis.   HEMODYNAMICS:  Systemic arterial pressure 123/50, LV systemic pressure  124/63, LVEP of 7.   CONCLUSION:  1. No significant coronary artery disease.  2. Normal left ventricular systolic function with wall motion abnormalities     as noted above.  Garrett Snow, M.D.    RHM/MEDQ  D:  09/17/2003  T:  09/19/2003  Job:  [id number]   cc:   Maxwell Caul, M.D.  [address]  [place]  [place] [address]  Fax: [phone]

## 2011-01-05 NOTE — Op Note (Signed)
NAME:  HEBERT, DOOLING                           ACCOUNT NO.:  [id number]   MEDICAL RECORD NO.:  [id number]                   PATIENT TYPE:  INP   LOCATION:  0001                                 FACILITY:  [facility]   PHYSICIAN:  Georgiana Spinner, M.D.                 DATE OF BIRTH:  1919-05-15   DATE OF PROCEDURE:  DATE OF DISCHARGE:                                 OPERATIVE REPORT   PROCEDURE:  Colonoscopy with injection of polyp.   ANESTHESIA:  Demerol 20 mg, Versed 3mg .   DESCRIPTION OF PROCEDURE:  With the patient mildly sedated and in the left  lateral decubitus position, the Olympus videoscopic colonoscope was inserted  in the rectum and passed under direct vision to the ascending colon just  above the cecum.  The ileocecal valve was visualized and photographed.  From  this point,  the endoscope was slowly withdrawn, taking circumferential  views of the colonic mucosa, stopping in the proximal to mid transverse  colon where the previously noted large polyp was seen, photographed and  injected with [place] ink dye to mark the spot for surgical removal and 1 mL  was injected on either side of the polyp.  From this point the colonoscope  was then slowly withdrawn taking circumferential view of the remaining  colonic mucosa, stopping only in the rectum which appeared normal on direct  and  retroflexed view.  The endoscope was straightened and withdrawn.  The  patient's vital signs and pulse oximetry remained stable.  The patient  tolerated the procedure well without apparent complications.   FINDINGS:  Polyp as noted previously, injected for removal; otherwise  unremarkable examination.   PLAN:  The patient to have surgery later today.                                               Georgiana Spinner, M.D.    GMO/MEDQ  D:  10/22/2003  T:  10/22/2003  Job:  [id number]   cc:   Maxwell Caul, M.D.  [address]  [place]  [place] [address]  Fax: [phone]   Currie Paris, M.D.  [address]  [place]  [place] [address]  Fax: [phone]

## 2011-01-05 NOTE — Op Note (Signed)
Garrett Snow, Garrett Snow                 ACCOUNT NO.:  [id number]   MEDICAL RECORD NO.:  [id number]          PATIENT TYPE:  AMB   LOCATION:  ENDO                         FACILITY:  [facility]   PHYSICIAN:  Georgiana Spinner, M.D.    DATE OF BIRTH:  Jun 24, 1919   DATE OF PROCEDURE:  10/30/2004  DATE OF DISCHARGE:                                 OPERATIVE REPORT   PROCEDURE:  Colonoscopy.   INDICATIONS:  Follow-up of colon polyp with high-grade dysplasia resected a  year ago.   ANESTHESIA:  Demerol 30, Versed 3 mg.   PROCEDURE:  With the patient mildly sedated in the left lateral decubitus  position, the Olympus videoscopic colonoscope was inserted in the rectum,  passed under direct vision to the cecum identified by ileocecal valve and  appendiceal orifice. From this point, the colonoscope was slowly withdrawn  taking circumferential views of colonic mucosa stopping only in the rectum  which appeared normal on direct and retroflexed view. The endoscope was  straightened and withdrawn. The patient's vital signs and pulse oximeter,  remained stable. The patient tolerated procedure well without apparent  complications.   FINDINGS:  Rather unremarkable colonoscopic examination.  Of note, prostate  exam was also normal.   PLAN:  Follow-up with me will be in five years if clinically relevant at  that time or as needed.      GMO/MEDQ  D:  10/30/2004  T:  10/30/2004  Job:  [id number]   cc:   Currie Paris, M.D.  [address]  [place]  [place] [address]

## 2011-01-05 NOTE — Discharge Summary (Signed)
Garrett Snow, Garrett Snow                           ACCOUNT NO.:  [id number]   MEDICAL RECORD NO.:  [id number]                   PATIENT TYPE:  INP   LOCATION:  0364                                 FACILITY:  [facility]   PHYSICIAN:  Currie Paris, M.D.           DATE OF BIRTH:  05-08-1919   DATE OF ADMISSION:  10/22/2003  DATE OF DISCHARGE:  10/29/2003                                 DISCHARGE SUMMARY   FINAL DIAGNOSES:  1. Transverse colon polyps.  2. Hypertension.  3. Hearing loss.   PAST MEDICAL HISTORY:  Mr. Raburn is an 77 year old gentleman who was  recently found to have a fairly large transverse colon polyp. It was not  able to be resected through a colonoscope and had some dysplasia, so  operative resected was recommended.   HOSPITAL COURSE:  The patient was admitted on October 22, 2003, and taken to  the operating room. A partial colectomy taking out his transverse colon was  performed and he tolerated the procedure well.   Postoperatively, he was kept NPO for a few days. He did have a fair amount  of abdominal distention, but we think a lot of that was due to air placed  during his colonoscopy. We put him on a little bit of Reglan and he  gradually developed some bowel function pretty much on schedule. He did have  a little bit of nausea that persisted, but that also cleared up. By the  fourth postoperative day he was feeling better, passing gas, his abdomen was  soft. By the fifth postoperative day, he was continuing to improve,  distention was getting less, diet was able to increased, and Foley catheter  removed. He was kept on full liquids and on the 10th was advanced to a solid  diet which he tolerated nicely. His wound was healing and ultimately his  staples were removed on the 10th. By the 11th he was able to go home. He had  walked almost a mile around the halls the day before. He had not taken  anything for pain for three days. He was tolerating a diet with no  nausea.   LABORATORY STUDIES:  Hemoglobin 12.4, white count 7800.  Electrolytes were  all within normal limits, although a couple of glucose values while on IV  fluids were in the 150 range. Urinalysis was negative. Chest x-ray  showed some mild bronchitic changes, otherwise unremarkable. Pathology  report is not in the chart; verbal for it was benign.   The patient will be in my office next week for followup. He will do long-  term followups with Dr. Roxan Hockey.                                               Currie Paris, M.D.  CJS/MEDQ  D:  10/29/2003  T:  10/29/2003  Job:  [id number]   cc:   Maxwell Caul, M.D.  [address]  [place]  [place] [address]  Fax: [phone]   Georgiana Spinner, M.D.  [address]  [place]  [place] [address]  Fax: [phone]

## 2011-01-05 NOTE — Op Note (Signed)
NAME:  Garrett Snow, Garrett Snow                           ACCOUNT NO.:  [id number]   MEDICAL RECORD NO.:  [id number]                   PATIENT TYPE:  AMB   LOCATION:  ENDO                                 FACILITY:  [facility]   PHYSICIAN:  Georgiana Spinner, M.D.                 DATE OF BIRTH:  Sep 07, 1918   DATE OF PROCEDURE:  DATE OF DISCHARGE:                                 OPERATIVE REPORT   PROCEDURE:  Upper endoscopy.   INDICATIONS:  Hemoccult positivity.   ANESTHESIA:  Demerol 40 mg, Versed 2 mg.   DESCRIPTION OF PROCEDURE:  With the patient mildly sedated in the left  lateral decubitus position, the Olympus videoscopic endoscope was inserted  in the mouth and passed under direct vision through the esophagus, which  appeared normal, into the stomach.  The fundus, body and antrum appeared  normal.  The duodenal bulb, however, appeared to be somewhat edematous and  there was in fact an ulcer seen in the duodenal bulb surrounded by erythema  and edema.  This was photographed only.  It was viable and oozed blood on  contact.  We advanced past this to the second portion of the duodenum which  appeared normal.  From this point, the endoscope was slowly withdrawn taking  circumferential views of the duodenal mucosa until the endoscope then pulled  back into the stomach, placed in retroflexion and viewed the stomach from  below.  The endoscope was then straightened, withdrawn, taking  circumferential views of the remaining gastric and esophageal mucosa.  The  patient's vital signs and pulse oximetry remained stable.  The patient  tolerated the procedure well without apparent complication.   FINDINGS:  Duodenal ulceration with erythema; otherwise unremarkable  examination.  CLO-test biopsy was taken.   PLAN:  Await biopsy report.  Start the patient on proton pump inhibitor.  Will stop aspirin at this time and then proceed with colonoscopy as planned.     Georgiana Spinner, M.D.    GMO/MEDQ  D:  07/26/2003  T:  07/26/2003  Job:  [id number]   cc:   Maxwell Caul, M.D.  [address]  [place]  [place] [address]  Fax: [phone]

## 2011-02-26 ENCOUNTER — Other Ambulatory Visit: Payer: Self-pay | Admitting: Oncology

## 2011-02-26 ENCOUNTER — Encounter (HOSPITAL_BASED_OUTPATIENT_CLINIC_OR_DEPARTMENT_OTHER): Payer: Medicare Other | Admitting: Oncology

## 2011-02-26 ENCOUNTER — Ambulatory Visit (HOSPITAL_COMMUNITY)
Admission: RE | Admit: 2011-02-26 | Discharge: 2011-02-26 | Disposition: A | Discharge status: Home / Self Care | Payer: Medicare Other | Source: Ambulatory Visit | Attending: Oncology | Admitting: Oncology

## 2011-02-26 DIAGNOSIS — Z85038 Personal history of other malignant neoplasm of large intestine: Secondary | ICD-10-CM

## 2011-02-26 DIAGNOSIS — Q619 Cystic kidney disease, unspecified: Secondary | ICD-10-CM | POA: Insufficient documentation

## 2011-02-26 DIAGNOSIS — C180 Malignant neoplasm of cecum: Secondary | ICD-10-CM

## 2011-02-26 DIAGNOSIS — R945 Abnormal results of liver function studies: Secondary | ICD-10-CM | POA: Insufficient documentation

## 2011-02-26 DIAGNOSIS — C20 Malignant neoplasm of rectum: Secondary | ICD-10-CM

## 2011-02-26 DIAGNOSIS — Z85048 Personal history of other malignant neoplasm of rectum, rectosigmoid junction, and anus: Secondary | ICD-10-CM | POA: Insufficient documentation

## 2011-02-26 LAB — COMPREHENSIVE METABOLIC PANEL
AST: 15 U/L (ref 0–37)
Alkaline Phosphatase: 76 U/L (ref 39–117)
BUN: 27 mg/dL — ABNORMAL HIGH (ref 6–23)
Creatinine, Ser: 1.43 mg/dL — ABNORMAL HIGH (ref 0.50–1.35)
Glucose, Bld: 98 mg/dL (ref 70–99)
Potassium: 4.5 mEq/L (ref 3.5–5.3)
Total Bilirubin: 0.7 mg/dL (ref 0.3–1.2)

## 2011-02-26 LAB — CBC WITH DIFFERENTIAL/PLATELET
BASO%: 0.6 % (ref 0.0–2.0)
EOS%: 3.2 % (ref 0.0–7.0)
LYMPH%: 38.2 % (ref 14.0–49.0)
MCHC: 33.4 g/dL (ref 32.0–36.0)
MCV: 91.1 fL (ref 79.3–98.0)
MONO%: 7.9 % (ref 0.0–14.0)
Platelets: 263 10*3/uL (ref 140–400)
RBC: 4.43 10*6/uL (ref 4.20–5.82)
RDW: 13.4 % (ref 11.0–14.6)

## 2011-02-26 LAB — CEA: CEA: 1.6 ng/mL (ref 0.0–5.0)

## 2011-09-03 ENCOUNTER — Ambulatory Visit (HOSPITAL_COMMUNITY)
Admission: RE | Admit: 2011-09-03 | Discharge: 2011-09-03 | Disposition: A | Discharge status: Home / Self Care | Payer: Medicare Other | Source: Ambulatory Visit | Attending: Oncology | Admitting: Oncology

## 2011-09-03 DIAGNOSIS — I700 Atherosclerosis of aorta: Secondary | ICD-10-CM | POA: Insufficient documentation

## 2011-09-03 DIAGNOSIS — Z85038 Personal history of other malignant neoplasm of large intestine: Secondary | ICD-10-CM

## 2011-09-03 DIAGNOSIS — C189 Malignant neoplasm of colon, unspecified: Secondary | ICD-10-CM | POA: Insufficient documentation

## 2011-09-03 DIAGNOSIS — Q619 Cystic kidney disease, unspecified: Secondary | ICD-10-CM | POA: Insufficient documentation

## 2011-09-07 ENCOUNTER — Ambulatory Visit: Payer: Medicare Other

## 2011-09-07 ENCOUNTER — Ambulatory Visit (HOSPITAL_BASED_OUTPATIENT_CLINIC_OR_DEPARTMENT_OTHER): Payer: Medicare Other | Admitting: Nurse Practitioner

## 2011-09-07 ENCOUNTER — Other Ambulatory Visit: Payer: Self-pay | Admitting: *Deleted

## 2011-09-07 VITALS — BP 146/73 | HR 60 | Temp 97.6°F | Wt 187.7 lb

## 2011-09-07 DIAGNOSIS — C189 Malignant neoplasm of colon, unspecified: Secondary | ICD-10-CM

## 2011-09-07 DIAGNOSIS — C180 Malignant neoplasm of cecum: Secondary | ICD-10-CM

## 2011-09-07 DIAGNOSIS — I10 Essential (primary) hypertension: Secondary | ICD-10-CM

## 2011-09-07 LAB — CBC WITH DIFFERENTIAL/PLATELET
BASO%: 0.4 % (ref 0.0–2.0)
EOS%: 3.3 % (ref 0.0–7.0)
HCT: 40.3 % (ref 38.4–49.9)
LYMPH%: 33.1 % (ref 14.0–49.0)
MCH: 30.9 pg (ref 27.2–33.4)
MCHC: 33.9 g/dL (ref 32.0–36.0)
NEUT%: 55 % (ref 39.0–75.0)
Platelets: 271 10*3/uL (ref 140–400)
RBC: 4.43 10*6/uL (ref 4.20–5.82)
lymph#: 1.6 10*3/uL (ref 0.9–3.3)

## 2011-09-07 LAB — COMPREHENSIVE METABOLIC PANEL
ALT: 14 U/L (ref 0–53)
AST: 21 U/L (ref 0–37)
Alkaline Phosphatase: 90 U/L (ref 39–117)
Creatinine, Ser: 1.45 mg/dL — ABNORMAL HIGH (ref 0.50–1.35)
Total Bilirubin: 0.5 mg/dL (ref 0.3–1.2)

## 2011-09-07 MED ORDER — POLYSACCHARIDE IRON 150 MG PO CAPS: 150.0000 mg | ORAL_CAPSULE | Freq: Two times a day (BID) | ORAL | Status: DC

## 2011-09-07 NOTE — Progress Notes (Signed)
OFFICE PROGRESS NOTE  Interval history:  Garrett Snow is a 78 year old man with a history of a stage III colon cancer resected in January 2010. He is seen today for scheduled followup.  Garrett Snow reports that overall he feels well. He has a good appetite. He reports his weight is stable. No nausea or vomiting. Bowels moving regularly. No hematochezia or melena. No abdominal pain.   Objective: Blood pressure 146/73, pulse 60, temperature 97.6 F (36.4 C), temperature source Oral, weight 187 lb 11.2 oz (85.14 kg).  Oropharynx is without thrush or ulceration. No palpable cervical, supra-clavicular, axillary or inguinal lymph nodes. Lungs are clear. No wheezes or rales. Regular cardiac rhythm. Abdomen is soft and nontender. No organomegaly. Extremities are without edema.  Lab Results: Lab Results  Component Value Date   WBC 5.2 02/26/2011   HGB 13.5 02/26/2011   HCT 40.4 02/26/2011   MCV 91.1 02/26/2011   PLT 263 02/26/2011    Chemistry:    Chemistry      Component Value Date/Time   NA 140 02/26/2011 1144   NA 140 02/26/2011 1144   K 4.5 02/26/2011 1144   K 4.5 02/26/2011 1144   CL 108 02/26/2011 1144   CL 108 02/26/2011 1144   CO2 26 02/26/2011 1144   CO2 26 02/26/2011 1144   BUN 27* 02/26/2011 1144   BUN 27* 02/26/2011 1144   CREATININE 1.43* 02/26/2011 1144   CREATININE 1.43* 02/26/2011 1144      Component Value Date/Time   CALCIUM 9.6 02/26/2011 1144   CALCIUM 9.6 02/26/2011 1144   ALKPHOS 76 02/26/2011 1144   ALKPHOS 76 02/26/2011 1144   AST 15 02/26/2011 1144   AST 15 02/26/2011 1144   ALT 10 02/26/2011 1144   ALT 10 02/26/2011 1144   BILITOT 0.7 02/26/2011 1144   BILITOT 0.7 02/26/2011 1144       Studies/Results: US Abdomen Complete  09/03/2011  *RADIOLOGY REPORT*  Clinical Data:  Colon cancer.  COMPLETE ABDOMINAL ULTRASOUND  Comparison:  02/26/2011  Findings:  Gallbladder:  No gallstones, gallbladder wall thickening, or pericholecystic fluid.  Common bile duct:   Normal caliber, 4 mm.  Liver:  No focal lesion  identified.  Within normal limits in parenchymal echogenicity.  IVC:  Appears normal.  Pancreas:  Limited visualization due to overlying bowel gas.  No focal abnormality in the visualized portions of the pancreas.  Spleen:  Scattered calcifications compatible with old granulomatous disease.  Normal size.  Right Kidney:   11 mm mid pole cyst.  No hydronephrosis.  Normal echotexture.  Left Kidney:  2.5 cm lower pole cyst.  No hydronephrosis.  Normal echotexture.  Abdominal aorta:  Atherosclerotic calcifications.  Maximum diameter 2.9 cm proximally.  IMPRESSION: No focal liver lesions visualized to suggest metastatic disease.  Bilateral renal cysts.  Calcified granulomas in the spleen.  Original Report Authenticated By: Cyndie Chime, M.D.    Medications: I have reviewed the patient's current medications.  Assessment/Plan: 1.  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  Normal preoperative CEA.  Abdominal ultrasound 09/03/2011 negative for evidence of metastatic disease. 2.  Hypertension-he reports a recent change in his blood pressure medication. We will contact his pharmacy for the name of the medication. 3.  Disposition - Garrett Snow appears stable.  He will return to our lab today for a CBC, chemistry panel and CEA. He will return for labs, abdominal ultrasound and a followup followup visit in 6 months.  He will contact the office in the interim with any problems.   Lonna Cobb ANP/GNP-BC

## 2012-03-07 ENCOUNTER — Ambulatory Visit (HOSPITAL_BASED_OUTPATIENT_CLINIC_OR_DEPARTMENT_OTHER): Payer: Medicare Other | Admitting: Oncology

## 2012-03-07 ENCOUNTER — Encounter: Payer: Self-pay | Admitting: Oncology

## 2012-03-07 ENCOUNTER — Telehealth: Payer: Self-pay | Admitting: Oncology

## 2012-03-07 ENCOUNTER — Ambulatory Visit (HOSPITAL_COMMUNITY)
Admission: RE | Admit: 2012-03-07 | Discharge: 2012-03-07 | Disposition: A | Discharge status: Home / Self Care | Payer: Medicare Other | Source: Ambulatory Visit | Attending: Nurse Practitioner | Admitting: Nurse Practitioner

## 2012-03-07 ENCOUNTER — Other Ambulatory Visit (HOSPITAL_BASED_OUTPATIENT_CLINIC_OR_DEPARTMENT_OTHER): Payer: Medicare Other | Admitting: Lab

## 2012-03-07 ENCOUNTER — Other Ambulatory Visit (HOSPITAL_COMMUNITY): Payer: Medicare Other

## 2012-03-07 VITALS — BP 152/70 | HR 52 | Temp 97.1°F | Wt 180.0 lb

## 2012-03-07 DIAGNOSIS — C189 Malignant neoplasm of colon, unspecified: Secondary | ICD-10-CM | POA: Insufficient documentation

## 2012-03-07 DIAGNOSIS — Z85038 Personal history of other malignant neoplasm of large intestine: Secondary | ICD-10-CM

## 2012-03-07 DIAGNOSIS — M1990 Unspecified osteoarthritis, unspecified site: Secondary | ICD-10-CM

## 2012-03-07 DIAGNOSIS — Q619 Cystic kidney disease, unspecified: Secondary | ICD-10-CM | POA: Insufficient documentation

## 2012-03-07 DIAGNOSIS — I10 Essential (primary) hypertension: Secondary | ICD-10-CM

## 2012-03-07 DIAGNOSIS — I700 Atherosclerosis of aorta: Secondary | ICD-10-CM | POA: Insufficient documentation

## 2012-03-07 HISTORY — DX: Unspecified osteoarthritis, unspecified site: M19.90

## 2012-03-07 HISTORY — DX: Essential (primary) hypertension: I10

## 2012-03-07 LAB — CBC WITH DIFFERENTIAL/PLATELET
Basophils Absolute: 0.1 10*3/uL (ref 0.0–0.1)
EOS%: 5.5 % (ref 0.0–7.0)
HGB: 13.5 g/dL (ref 13.0–17.1)
MCH: 30.4 pg (ref 27.2–33.4)
NEUT#: 2.2 10*3/uL (ref 1.5–6.5)
RDW: 13.7 % (ref 11.0–14.6)
lymph#: 1.7 10*3/uL (ref 0.9–3.3)

## 2012-03-07 LAB — COMPREHENSIVE METABOLIC PANEL
AST: 16 U/L (ref 0–37)
Albumin: 4.1 g/dL (ref 3.5–5.2)
BUN: 27 mg/dL — ABNORMAL HIGH (ref 6–23)
CO2: 25 mEq/L (ref 19–32)
Calcium: 9.3 mg/dL (ref 8.4–10.5)
Chloride: 110 mEq/L (ref 96–112)
Glucose, Bld: 103 mg/dL — ABNORMAL HIGH (ref 70–99)
Potassium: 4.6 mEq/L (ref 3.5–5.3)

## 2012-03-07 LAB — CEA: CEA: 1.7 ng/mL (ref 0.0–5.0)

## 2012-03-07 NOTE — Telephone Encounter (Signed)
gv pt appt schedule for July 2014 including [place] for 03/06/2013.

## 2012-03-07 NOTE — Progress Notes (Signed)
Hematology and Oncology Follow Up Visit  Garrett Snow [id number] 13-May-1919 78 y.o. 03/07/2012 2:44 PM   Principle Diagnosis: Encounter Diagnoses  Name Primary?  . History of colon cancer, stage III   . 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 He had a T4 N1 lesion. Normal preoperative CEA. After full discussion with him and his family I elected not to put him on any chemotherapy. I have been following him with every 6 month ultrasounds of his liver as well as medical visits and lab tests. There have been no signs for any new disease now out 3-1/2 years. He continues to do well. Most of his problems relate to his old age with decreased ability to do all the things he used to do in the past due to limitations from arthritis particularly his left shoulder. He denies any abdominal pain or cramps. No change in bowel habit. No hematochezia or melena.  Medications: reviewed  Allergies: No Known Allergies  Review of Systems: Constitutional:   No constitutional symptoms Respiratory: No cough or dyspnea Cardiovascular: No chest pain or palpitations   Gastrointestinal: See above Genito-Urinary: No urinary tract symptoms  Musculoskeletal: See above Neurologic: No headache or change in vision Skin: No rash Remaining ROS negative.  Physical Exam: Blood pressure 152/70, pulse 52, temperature 97.1 F (36.2 C), temperature source Oral, weight 180 lb (81.647 kg). Wt Readings from Last 3 Encounters:  03/07/12 180 lb (81.647 kg)  09/07/11 187 lb 11.2 oz (85.14 kg)     General appearance: Thin Caucasian man HENNT: Pharynx no erythema or exudate Lymph nodes: No adenopathy  Breasts: Lungs: Clear to auscultation resonant to percussion Heart: Regular rhythm no murmur Abdomen: Soft nontender no mass no organomegaly Extremities: No edema  no calf tenderness Vascular: No cyanosis Neurologic: No focal deficit Skin: Few scattered ecchymoses on his arms  Lab Results: Lab Results  Component Value Date   WBC 4.6 03/07/2012   HGB 13.5 03/07/2012   HCT 40.8 03/07/2012   MCV 91.8 03/07/2012   PLT 253 03/07/2012     Chemistry      Component Value Date/Time   NA 141 09/07/2011 1119   K 4.7 09/07/2011 1119   CL 108 09/07/2011 1119   CO2 26 09/07/2011 1119   BUN 27* 09/07/2011 1119   CREATININE 1.45* 09/07/2011 1119      Component Value Date/Time   CALCIUM 9.4 09/07/2011 1119   ALKPHOS 90 09/07/2011 1119   AST 21 09/07/2011 1119   ALT 14 09/07/2011 1119   BILITOT 0.5 09/07/2011 1119       Radiological Studies: US Abdomen Complete  03/07/2012  *RADIOLOGY REPORT*  Clinical Data:  stage III colon cancer.  Prior colon resection.  COMPLETE ABDOMINAL ULTRASOUND  Comparison:  09/03/2011  Findings:  Gallbladder:  No gallstones, gallbladder wall thickening, or pericholecystic fluid.  Common bile duct:   Normal caliber, 6 mm.  Liver:  Normal size and echotexture.  No focal abnormality. No biliary ductal dilatation.  IVC:  Appears normal.  Pancreas:  Limited visualization due to overlying bowel gas.  Spleen:  Scattered calcifications noted compatible with old granulomatous disease.  No other focal abnormality.  Normal size.  Right Kidney:   11.9 cm.  Multiple cysts, the largest 1.7 cm in the upper pole.  No hydronephrosis.Cortical thinning.  Left Kidney:  12.7  cm.  Multiple cysts, the largest measuring 3.0 cm in the lower pole.  Mild cortical thinning.  No hydronephrosis.  Abdominal aorta:  Calcified plaque.  No aneurysm.  IMPRESSION: Mild cortical thinning within the kidneys bilaterally.  Bilateral renal cysts.  Old granulomatous disease within the spleen.  No acute findings unchanged since prior study.  Original Report Authenticated By: Cyndie Chime, M.D.    Impression and Plan: #1. Stage III one node positive CEA normal cancer of the cecum  treated with surgery alone  he remains free of any obvious recurrence now out 3-1/2 years from surgery. 3 year cancer free survival predicts five-year survival so I am comfortable decreasing frequency of visits to this office to once a year. I Advised him to see his family physician once a year as well so that he sees somebody twice a year.  #2. Essential hypertension.  #3. Chronic mild renal insufficiency.  #4. Asymptomatic calcified granulomas in the spleen.  CC:. Dr. Feliciana Rossetti.; Dr. Cyndia Bent   Levert Feinstein, MD 7/19/20132:44 PM

## 2012-03-10 ENCOUNTER — Telehealth: Payer: Self-pay | Admitting: *Deleted

## 2012-03-10 NOTE — Telephone Encounter (Signed)
Message copied by Orbie Hurst on Mon Mar 10, 2012  1:55 PM ------      Message from: Levert Feinstein      Created: Fri Mar 07, 2012  3:40 PM       Call pt US liver normal

## 2012-03-10 NOTE — Telephone Encounter (Signed)
Called patient.  He is not home at present.  Spoke with son, Michele Mcalpine and let him know that [place] of liver is normal and he will let his Dad know.  Son said patient is to return to Dr. Cyndie Chime in one year.

## 2012-10-23 ENCOUNTER — Other Ambulatory Visit: Payer: Self-pay | Admitting: Nurse Practitioner

## 2012-10-23 DIAGNOSIS — C189 Malignant neoplasm of colon, unspecified: Secondary | ICD-10-CM

## 2013-03-06 ENCOUNTER — Other Ambulatory Visit (HOSPITAL_BASED_OUTPATIENT_CLINIC_OR_DEPARTMENT_OTHER): Payer: Medicare Other | Admitting: Lab

## 2013-03-06 ENCOUNTER — Ambulatory Visit (HOSPITAL_COMMUNITY)
Admission: RE | Admit: 2013-03-06 | Discharge: 2013-03-06 | Disposition: A | Discharge status: Home / Self Care | Payer: Medicare Other | Source: Ambulatory Visit | Attending: Oncology | Admitting: Oncology

## 2013-03-06 ENCOUNTER — Ambulatory Visit (HOSPITAL_BASED_OUTPATIENT_CLINIC_OR_DEPARTMENT_OTHER): Payer: Medicare Other | Admitting: Nurse Practitioner

## 2013-03-06 ENCOUNTER — Telehealth: Payer: Self-pay | Admitting: Oncology

## 2013-03-06 VITALS — BP 134/51 | HR 44 | Temp 97.0°F | Resp 20 | Ht 73.0 in | Wt 175.0 lb

## 2013-03-06 DIAGNOSIS — C189 Malignant neoplasm of colon, unspecified: Secondary | ICD-10-CM

## 2013-03-06 DIAGNOSIS — N281 Cyst of kidney, acquired: Secondary | ICD-10-CM | POA: Insufficient documentation

## 2013-03-06 LAB — CBC WITH DIFFERENTIAL/PLATELET
Basophils Absolute: 0 10*3/uL (ref 0.0–0.1)
Eosinophils Absolute: 0.2 10*3/uL (ref 0.0–0.5)
HGB: 13.9 g/dL (ref 13.0–17.1)
LYMPH%: 42.4 % (ref 14.0–49.0)
MCV: 90.9 fL (ref 79.3–98.0)
MONO#: 0.6 10*3/uL (ref 0.1–0.9)
MONO%: 8.7 % (ref 0.0–14.0)
NEUT#: 2.9 10*3/uL (ref 1.5–6.5)
Platelets: 252 10*3/uL (ref 140–400)
RDW: 13.7 % (ref 11.0–14.6)

## 2013-03-06 LAB — LACTATE DEHYDROGENASE (CC13): LDH: 159 U/L (ref 125–245)

## 2013-03-06 LAB — COMPREHENSIVE METABOLIC PANEL (CC13)
Albumin: 3.7 g/dL (ref 3.5–5.0)
Alkaline Phosphatase: 101 U/L (ref 40–150)
BUN: 23.4 mg/dL (ref 7.0–26.0)
CO2: 25 mEq/L (ref 22–29)
Calcium: 9.8 mg/dL (ref 8.4–10.4)
Glucose: 106 mg/dl (ref 70–140)
Potassium: 4.2 mEq/L (ref 3.5–5.1)

## 2013-03-06 NOTE — Progress Notes (Signed)
OFFICE PROGRESS NOTE  Interval history:   Garrett Snow is a 79 year old man with a history of a stage III (T4 N1) colon cancer resected in January 2010. He is seen today for scheduled followup.  He overall feels well. No interim illnesses or infections. No change in bowel habits. No abdominal pain. No hematochezia or melena. He has a good appetite. He would like to be more active but is limited due to arthritis. He is able to do yard work which he enjoys.   Objective: Blood pressure 134/51, pulse 44, temperature 97 F (36.1 C), temperature source Oral, resp. rate 20, height 6\' 1"  (1.854 m), weight 175 lb (79.379 kg).  Oropharynx is without thrush or ulceration. No palpable cervical, supra-clavicular, axillary or inguinal lymph nodes. Lungs are clear. No wheezes or rales. Regular cardiac rhythm. Abdomen is soft and nontender. No organomegaly. Extremities are without edema.   Lab Results: Lab Results  Component Value Date   WBC 6.5 03/06/2013   HGB 13.9 03/06/2013   HCT 41.9 03/06/2013   MCV 90.9 03/06/2013   PLT 252 03/06/2013    Chemistry:    Chemistry      Component Value Date/Time   NA 141 03/06/2013 1025   NA 142 03/07/2012 1025   K 4.2 03/06/2013 1025   K 4.6 03/07/2012 1025   CL 110 03/07/2012 1025   CO2 25 03/06/2013 1025   CO2 25 03/07/2012 1025   BUN 23.4 03/06/2013 1025   BUN 27* 03/07/2012 1025   CREATININE 1.3 03/06/2013 1025   CREATININE 1.47* 03/07/2012 1025      Component Value Date/Time   CALCIUM 9.8 03/06/2013 1025   CALCIUM 9.3 03/07/2012 1025   ALKPHOS 101 03/06/2013 1025   ALKPHOS 92 03/07/2012 1025   AST 20 03/06/2013 1025   AST 16 03/07/2012 1025   ALT 15 03/06/2013 1025   ALT 8 03/07/2012 1025   BILITOT 0.75 03/06/2013 1025   BILITOT 0.7 03/07/2012 1025       Studies/Results: No results found.  Medications: I have reviewed the patient's current medications.  Assessment/Plan:  1. Stage III (T4 N1) 1 node positive cancer of the cecum status post surgery  09/13/2008. Normal preoperative CEA. Abdominal ultrasound 03/07/2012 negative for evidence of metastatic disease. 2. Hypertension. 3. Chronic mild renal insufficiency. 4. Asymptomatic calcified granulomas in the spleen.  Disposition-Garrett Snow appears stable. He remains in clinical remission from colon cancer. We will followup on the abdominal ultrasound from earlier today. He will return for a followup visit with labs and abdominal ultrasound in one year. He understands to contact the office prior to that visit with any problems or concerns.  Lonna Cobb ANP/GNP-BC   CC Dr. Feliciana Rossetti and Dr. Cyndia Bent.

## 2013-03-06 NOTE — Telephone Encounter (Signed)
Gave pt appt for lab and  Md on July 2015, U/S before visit

## 2013-06-11 ENCOUNTER — Ambulatory Visit: Payer: Medicare Other | Admitting: Cardiology

## 2013-08-31 ENCOUNTER — Other Ambulatory Visit: Payer: Self-pay | Admitting: *Deleted

## 2013-08-31 DIAGNOSIS — C189 Malignant neoplasm of colon, unspecified: Secondary | ICD-10-CM

## 2013-08-31 MED ORDER — POLYSACCHARIDE IRON COMPLEX 150 MG PO CAPS: ORAL_CAPSULE | ORAL | Status: AC

## 2013-08-31 NOTE — Telephone Encounter (Signed)
Notified pt's son [name] Flaim) that Fer rex re-fill has been sent per request.  Pt's son verbalized understanding and expressed appreciation.

## 2013-10-17 ENCOUNTER — Telehealth: Payer: Self-pay | Admitting: *Deleted

## 2013-10-17 ENCOUNTER — Encounter: Payer: Self-pay | Admitting: *Deleted

## 2013-10-17 NOTE — Telephone Encounter (Signed)
Former pt of DR. G...td  

## 2014-03-05 ENCOUNTER — Other Ambulatory Visit (HOSPITAL_BASED_OUTPATIENT_CLINIC_OR_DEPARTMENT_OTHER): Payer: Medicare Other

## 2014-03-05 ENCOUNTER — Ambulatory Visit: Payer: Medicare Other | Admitting: Oncology

## 2014-03-05 ENCOUNTER — Other Ambulatory Visit: Payer: Medicare Other

## 2014-03-05 ENCOUNTER — Ambulatory Visit (HOSPITAL_BASED_OUTPATIENT_CLINIC_OR_DEPARTMENT_OTHER): Payer: Medicare Other | Admitting: Oncology

## 2014-03-05 VITALS — BP 153/54 | HR 50 | Temp 97.2°F | Resp 19 | Ht 73.0 in | Wt 174.4 lb

## 2014-03-05 DIAGNOSIS — I10 Essential (primary) hypertension: Secondary | ICD-10-CM

## 2014-03-05 DIAGNOSIS — N289 Disorder of kidney and ureter, unspecified: Secondary | ICD-10-CM

## 2014-03-05 DIAGNOSIS — Z85038 Personal history of other malignant neoplasm of large intestine: Secondary | ICD-10-CM

## 2014-03-05 DIAGNOSIS — C189 Malignant neoplasm of colon, unspecified: Secondary | ICD-10-CM

## 2014-03-05 LAB — CBC WITH DIFFERENTIAL/PLATELET
BASO%: 0.6 % (ref 0.0–2.0)
BASOS ABS: 0 10*3/uL (ref 0.0–0.1)
EOS%: 5.8 % (ref 0.0–7.0)
Eosinophils Absolute: 0.3 10*3/uL (ref 0.0–0.5)
HEMATOCRIT: 40.2 % (ref 38.4–49.9)
HEMOGLOBIN: 12.9 g/dL — AB (ref 13.0–17.1)
LYMPH%: 39.2 % (ref 14.0–49.0)
MCH: 29.7 pg (ref 27.2–33.4)
MCHC: 32.2 g/dL (ref 32.0–36.0)
MCV: 92.2 fL (ref 79.3–98.0)
MONO#: 0.6 10*3/uL (ref 0.1–0.9)
MONO%: 10.7 % (ref 0.0–14.0)
NEUT#: 2.4 10*3/uL (ref 1.5–6.5)
NEUT%: 43.7 % (ref 39.0–75.0)
Platelets: 249 10*3/uL (ref 140–400)
RBC: 4.36 10*6/uL (ref 4.20–5.82)
RDW: 13.5 % (ref 11.0–14.6)
WBC: 5.5 10*3/uL (ref 4.0–10.3)
lymph#: 2.1 10*3/uL (ref 0.9–3.3)

## 2014-03-05 LAB — COMPREHENSIVE METABOLIC PANEL (CC13)
ALK PHOS: 103 U/L (ref 40–150)
ALT: 12 U/L (ref 0–55)
AST: 17 U/L (ref 5–34)
Albumin: 3.7 g/dL (ref 3.5–5.0)
Anion Gap: 7 mEq/L (ref 3–11)
BILIRUBIN TOTAL: 0.64 mg/dL (ref 0.20–1.20)
BUN: 27.2 mg/dL — AB (ref 7.0–26.0)
CALCIUM: 9.6 mg/dL (ref 8.4–10.4)
CO2: 24 mEq/L (ref 22–29)
CREATININE: 1.2 mg/dL (ref 0.7–1.3)
Chloride: 112 mEq/L — ABNORMAL HIGH (ref 98–109)
Glucose: 96 mg/dl (ref 70–140)
Potassium: 4.5 mEq/L (ref 3.5–5.1)
Sodium: 143 mEq/L (ref 136–145)
Total Protein: 6.7 g/dL (ref 6.4–8.3)

## 2014-03-05 NOTE — Progress Notes (Signed)
OFFICE PROGRESS NOTE   Principal diagnosis: 80 year old man with a history of a stage III (T4 N1) colon cancer resected in January 2010.   Prior therapy: Status post surgery 09/13/2008. He has been on active surveillance since that time.  History of present illness: This is a pleasant 80 year old gentleman presents today for a followup with his son. He used to be followed by Dr. Beryle Beams for the above diagnosis. Since his last visit he has no new complaints.He overall feels well. No interim illnesses or infections. No change in bowel habits. No abdominal pain. No hematochezia or melena. He has a good appetite. He would like to be more active but is limited due to arthritis. He is able to perform most activities of daily living without any decline. He does not report any headaches or blurry vision or syncope. Does not report any urinary symptoms her skeletal complaints. Rest of his review of systems unremarkable.   Objective: Blood pressure 153/54, pulse 50, temperature 97.2 F (36.2 C), temperature source Oral, resp. rate 19, height 6\' 1"  (1.854 m), weight 174 lb 6.4 oz (79.107 kg).  Oropharynx is without thrush or ulceration.  No palpable cervical, supra-clavicular, axillary or inguinal lymph nodes.  Lungs are clear. No wheezes or rales. Regular cardiac rhythm. A bdomen is soft and nontender. No organomegaly.  Extremities are without edema.   Lab Results: Lab Results  Component Value Date   WBC 5.5 03/05/2014   HGB 12.9* 03/05/2014   HCT 40.2 03/05/2014   MCV 92.2 03/05/2014   PLT 249 03/05/2014    Chemistry:    Chemistry      Component Value Date/Time   NA 141 03/06/2013 1025   NA 142 03/07/2012 1025   K 4.2 03/06/2013 1025   K 4.6 03/07/2012 1025   CL 110 03/07/2012 1025   CO2 25 03/06/2013 1025   CO2 25 03/07/2012 1025   BUN 23.4 03/06/2013 1025   BUN 27* 03/07/2012 1025   CREATININE 1.3 03/06/2013 1025   CREATININE 1.47* 03/07/2012 1025      Component Value Date/Time   CALCIUM 9.8 03/06/2013 1025   CALCIUM 9.3 03/07/2012 1025   ALKPHOS 101 03/06/2013 1025   ALKPHOS 92 03/07/2012 1025   AST 20 03/06/2013 1025   AST 16 03/07/2012 1025   ALT 15 03/06/2013 1025   ALT 8 03/07/2012 1025   BILITOT 0.75 03/06/2013 1025   BILITOT 0.7 03/07/2012 1025       Medications: I have reviewed the patient's current medications.  Assessment/Plan:   80 year old gentleman with the following issues:  1. Stage III (T4 N1) 1 node positive cancer of the cecum status post surgery 09/13/2008. Normal preoperative CEA. Abdominal ultrasound 03/07/2012 negative for evidence of metastatic disease. He continues to have no evidence of disease and no further oncology followup is needed at this time. He will followup with [place] as needed. 2. Hypertension. Followed by his primary care physician. 3. Chronic mild renal insufficiency. Appears to be stable.    [facility] MD

## 2014-03-06 LAB — CEA: CEA: 1.5 ng/mL (ref 0.0–5.0)

## 2015-03-02 ENCOUNTER — Encounter: Payer: Self-pay | Admitting: *Deleted

## 2015-03-29 ENCOUNTER — Encounter: Payer: Self-pay | Admitting: Cardiology

## 2015-03-30 ENCOUNTER — Encounter: Payer: Self-pay | Admitting: Cardiology

## 2017-08-14 ENCOUNTER — Inpatient Hospital Stay (HOSPITAL_COMMUNITY)
Admission: EM | Admit: 2017-08-14 | Discharge: 2017-08-17 | DRG: 470 | Disposition: A | Discharge status: Skilled Nursing Facility | Payer: Medicare Other | Attending: Internal Medicine | Admitting: Internal Medicine

## 2017-08-14 ENCOUNTER — Emergency Department (HOSPITAL_COMMUNITY): Payer: Medicare Other

## 2017-08-14 ENCOUNTER — Inpatient Hospital Stay (HOSPITAL_COMMUNITY): Payer: Medicare Other | Admitting: Anesthesiology

## 2017-08-14 ENCOUNTER — Encounter (HOSPITAL_COMMUNITY)
Admission: EM | Disposition: A | Discharge status: Skilled Nursing Facility | Payer: Self-pay | Source: Home / Self Care | Attending: Internal Medicine

## 2017-08-14 ENCOUNTER — Other Ambulatory Visit: Payer: Self-pay

## 2017-08-14 ENCOUNTER — Inpatient Hospital Stay (HOSPITAL_COMMUNITY): Payer: Medicare Other

## 2017-08-14 ENCOUNTER — Encounter (HOSPITAL_COMMUNITY): Payer: Self-pay

## 2017-08-14 DIAGNOSIS — S72091A Other fracture of head and neck of right femur, initial encounter for closed fracture: Principal | ICD-10-CM | POA: Diagnosis present

## 2017-08-14 DIAGNOSIS — N179 Acute kidney failure, unspecified: Secondary | ICD-10-CM | POA: Diagnosis not present

## 2017-08-14 DIAGNOSIS — Z8781 Personal history of (healed) traumatic fracture: Secondary | ICD-10-CM

## 2017-08-14 DIAGNOSIS — Z8679 Personal history of other diseases of the circulatory system: Secondary | ICD-10-CM | POA: Diagnosis not present

## 2017-08-14 DIAGNOSIS — W1839XA Other fall on same level, initial encounter: Secondary | ICD-10-CM | POA: Diagnosis present

## 2017-08-14 DIAGNOSIS — Y92009 Unspecified place in unspecified non-institutional (private) residence as the place of occurrence of the external cause: Secondary | ICD-10-CM

## 2017-08-14 DIAGNOSIS — N183 Chronic kidney disease, stage 3 (moderate): Secondary | ICD-10-CM | POA: Diagnosis present

## 2017-08-14 DIAGNOSIS — Z87891 Personal history of nicotine dependence: Secondary | ICD-10-CM

## 2017-08-14 DIAGNOSIS — I714 Abdominal aortic aneurysm, without rupture, unspecified: Secondary | ICD-10-CM

## 2017-08-14 DIAGNOSIS — Z01818 Encounter for other preprocedural examination: Secondary | ICD-10-CM

## 2017-08-14 DIAGNOSIS — H9190 Unspecified hearing loss, unspecified ear: Secondary | ICD-10-CM | POA: Diagnosis present

## 2017-08-14 DIAGNOSIS — S72009S Fracture of unspecified part of neck of unspecified femur, sequela: Secondary | ICD-10-CM

## 2017-08-14 DIAGNOSIS — S72009A Fracture of unspecified part of neck of unspecified femur, initial encounter for closed fracture: Secondary | ICD-10-CM

## 2017-08-14 DIAGNOSIS — Z79899 Other long term (current) drug therapy: Secondary | ICD-10-CM

## 2017-08-14 DIAGNOSIS — S72001A Fracture of unspecified part of neck of right femur, initial encounter for closed fracture: Secondary | ICD-10-CM

## 2017-08-14 DIAGNOSIS — I129 Hypertensive chronic kidney disease with stage 1 through stage 4 chronic kidney disease, or unspecified chronic kidney disease: Secondary | ICD-10-CM | POA: Diagnosis present

## 2017-08-14 DIAGNOSIS — M25551 Pain in right hip: Secondary | ICD-10-CM | POA: Diagnosis present

## 2017-08-14 DIAGNOSIS — W19XXXA Unspecified fall, initial encounter: Secondary | ICD-10-CM

## 2017-08-14 DIAGNOSIS — D62 Acute posthemorrhagic anemia: Secondary | ICD-10-CM | POA: Diagnosis not present

## 2017-08-14 DIAGNOSIS — Z66 Do not resuscitate: Secondary | ICD-10-CM | POA: Diagnosis present

## 2017-08-14 DIAGNOSIS — I10 Essential (primary) hypertension: Secondary | ICD-10-CM | POA: Diagnosis present

## 2017-08-14 DIAGNOSIS — Z9889 Other specified postprocedural states: Secondary | ICD-10-CM

## 2017-08-14 HISTORY — DX: Fracture of unspecified part of neck of right femur, initial encounter for closed fracture: S72.001A

## 2017-08-14 HISTORY — PX: HIP ARTHROPLASTY: SHX981

## 2017-08-14 LAB — CBC WITH DIFFERENTIAL/PLATELET
Basophils Absolute: 0 10*3/uL (ref 0.0–0.1)
Basophils Relative: 0 %
EOS ABS: 0.1 10*3/uL (ref 0.0–0.7)
EOS PCT: 1 %
HCT: 34.9 % — ABNORMAL LOW (ref 39.0–52.0)
Hemoglobin: 11.2 g/dL — ABNORMAL LOW (ref 13.0–17.0)
LYMPHS ABS: 1.3 10*3/uL (ref 0.7–4.0)
LYMPHS PCT: 18 %
MCH: 30.3 pg (ref 26.0–34.0)
MCHC: 32.1 g/dL (ref 30.0–36.0)
MCV: 94.3 fL (ref 78.0–100.0)
MONOS PCT: 11 %
Monocytes Absolute: 0.8 10*3/uL (ref 0.1–1.0)
Neutro Abs: 5.3 10*3/uL (ref 1.7–7.7)
Neutrophils Relative %: 70 %
PLATELETS: 282 10*3/uL (ref 150–400)
RBC: 3.7 MIL/uL — AB (ref 4.22–5.81)
RDW: 13.3 % (ref 11.5–15.5)
WBC: 7.6 10*3/uL (ref 4.0–10.5)

## 2017-08-14 LAB — COMPREHENSIVE METABOLIC PANEL
ALK PHOS: 109 U/L (ref 38–126)
ALT: 16 U/L — AB (ref 17–63)
AST: 25 U/L (ref 15–41)
Albumin: 3.5 g/dL (ref 3.5–5.0)
Anion gap: 6 (ref 5–15)
BUN: 32 mg/dL — ABNORMAL HIGH (ref 6–20)
CALCIUM: 9.3 mg/dL (ref 8.9–10.3)
CHLORIDE: 111 mmol/L (ref 101–111)
CO2: 23 mmol/L (ref 22–32)
CREATININE: 1.4 mg/dL — AB (ref 0.61–1.24)
GFR, EST AFRICAN AMERICAN: 47 mL/min — AB (ref >60)
GFR, EST NON AFRICAN AMERICAN: 41 mL/min — AB (ref >60)
Glucose, Bld: 109 mg/dL — ABNORMAL HIGH (ref 65–99)
Potassium: 4.1 mmol/L (ref 3.5–5.1)
SODIUM: 140 mmol/L (ref 135–145)
Total Bilirubin: 0.7 mg/dL (ref 0.3–1.2)
Total Protein: 6.1 g/dL — ABNORMAL LOW (ref 6.5–8.1)

## 2017-08-14 LAB — PROTIME-INR
INR: 1.13
Prothrombin Time: 14.5 seconds (ref 11.4–15.2)

## 2017-08-14 LAB — MAGNESIUM: Magnesium: 2.1 mg/dL (ref 1.7–2.4)

## 2017-08-14 SURGERY — HEMIARTHROPLASTY, HIP, DIRECT ANTERIOR APPROACH, FOR FRACTURE
Anesthesia: General | Laterality: Right

## 2017-08-14 MED ORDER — HYDROCODONE-ACETAMINOPHEN 5-325 MG PO TABS
1.0000 | ORAL_TABLET | Freq: Four times a day (QID) | ORAL | Status: DC | PRN
  Administered 2017-08-14: 1 via ORAL
  Filled 2017-08-14: qty 1

## 2017-08-14 MED ORDER — SENNA 8.6 MG PO TABS
1.0000 | ORAL_TABLET | Freq: Two times a day (BID) | ORAL | Status: DC
  Administered 2017-08-14 – 2017-08-17 (×6): 8.6 mg via ORAL
  Filled 2017-08-14 (×6): qty 1

## 2017-08-14 MED ORDER — ONDANSETRON HCL 4 MG/2ML IJ SOLN
INTRAMUSCULAR | Status: AC
  Filled 2017-08-14: qty 2

## 2017-08-14 MED ORDER — FENTANYL CITRATE (PF) 100 MCG/2ML IJ SOLN
50.0000 ug | INTRAMUSCULAR | Status: DC | PRN
  Administered 2017-08-14: 50 ug via INTRAVENOUS
  Filled 2017-08-14: qty 2

## 2017-08-14 MED ORDER — ACETAMINOPHEN 325 MG PO TABS: 650.0000 mg | ORAL_TABLET | Freq: Four times a day (QID) | ORAL | 1 refills | Status: DC | PRN

## 2017-08-14 MED ORDER — OXYCODONE HCL 5 MG PO TABS: 5.0000 mg | ORAL_TABLET | Freq: Once | ORAL | Status: DC | PRN

## 2017-08-14 MED ORDER — ONDANSETRON HCL 4 MG/2ML IJ SOLN
INTRAMUSCULAR | Status: DC | PRN
  Administered 2017-08-14: 4 mg via INTRAVENOUS

## 2017-08-14 MED ORDER — ENOXAPARIN SODIUM 40 MG/0.4ML ~~LOC~~ SOLN
40.0000 mg | SUBCUTANEOUS | Status: DC
  Administered 2017-08-15 – 2017-08-16 (×2): 40 mg via SUBCUTANEOUS
  Filled 2017-08-14 (×2): qty 0.4

## 2017-08-14 MED ORDER — LIDOCAINE 2% (20 MG/ML) 5 ML SYRINGE
INTRAMUSCULAR | Status: DC | PRN
  Administered 2017-08-14: 75 mg via INTRAVENOUS

## 2017-08-14 MED ORDER — VITAMIN D3 25 MCG (1000 UNIT) PO TABS
2000.0000 [IU] | ORAL_TABLET | Freq: Every day | ORAL | Status: DC
  Administered 2017-08-14 – 2017-08-17 (×4): 2000 [IU] via ORAL
  Filled 2017-08-14 (×4): qty 2

## 2017-08-14 MED ORDER — ONDANSETRON HCL 4 MG PO TABS: 4.0000 mg | ORAL_TABLET | Freq: Four times a day (QID) | ORAL | Status: DC | PRN

## 2017-08-14 MED ORDER — MORPHINE SULFATE (PF) 4 MG/ML IV SOLN: 0.5000 mg | INTRAVENOUS | Status: DC | PRN

## 2017-08-14 MED ORDER — PROPOFOL 10 MG/ML IV BOLUS
INTRAVENOUS | Status: DC | PRN
  Administered 2017-08-14: 120 mg via INTRAVENOUS

## 2017-08-14 MED ORDER — ENOXAPARIN SODIUM 40 MG/0.4ML ~~LOC~~ SOLN: 40.0000 mg | SUBCUTANEOUS | 0 refills | Status: DC

## 2017-08-14 MED ORDER — FINASTERIDE 5 MG PO TABS
5.0000 mg | ORAL_TABLET | Freq: Every day | ORAL | Status: DC
  Administered 2017-08-14 – 2017-08-17 (×4): 5 mg via ORAL
  Filled 2017-08-14 (×4): qty 1

## 2017-08-14 MED ORDER — FENTANYL CITRATE (PF) 100 MCG/2ML IJ SOLN: 25.0000 ug | INTRAMUSCULAR | Status: DC | PRN

## 2017-08-14 MED ORDER — FENTANYL CITRATE (PF) 100 MCG/2ML IJ SOLN
INTRAMUSCULAR | Status: AC
  Filled 2017-08-14: qty 2

## 2017-08-14 MED ORDER — CEFAZOLIN SODIUM-DEXTROSE 2-4 GM/100ML-% IV SOLN
2.0000 g | INTRAVENOUS | Status: AC
  Administered 2017-08-14: 2 g via INTRAVENOUS

## 2017-08-14 MED ORDER — SUCCINYLCHOLINE CHLORIDE 200 MG/10ML IV SOSY
PREFILLED_SYRINGE | INTRAVENOUS | Status: DC | PRN
  Administered 2017-08-14: 80 mg via INTRAVENOUS

## 2017-08-14 MED ORDER — POVIDONE-IODINE 10 % EX SWAB: 2.0000 "application " | Freq: Once | CUTANEOUS | Status: DC

## 2017-08-14 MED ORDER — PHENYLEPHRINE 40 MCG/ML (10ML) SYRINGE FOR IV PUSH (FOR BLOOD PRESSURE SUPPORT)
PREFILLED_SYRINGE | INTRAVENOUS | Status: AC
  Filled 2017-08-14: qty 10

## 2017-08-14 MED ORDER — SALINE SPRAY 0.65 % NA SOLN
1.0000 | NASAL | Status: DC | PRN
  Filled 2017-08-14: qty 44

## 2017-08-14 MED ORDER — MENTHOL 3 MG MT LOZG
1.0000 | LOZENGE | OROMUCOSAL | Status: DC | PRN
  Filled 2017-08-14: qty 9

## 2017-08-14 MED ORDER — MAGNESIUM CITRATE PO SOLN: 1.0000 | Freq: Once | ORAL | Status: DC | PRN

## 2017-08-14 MED ORDER — ST JOHNS WORT 150 MG PO CAPS: 1.0000 | ORAL_CAPSULE | Freq: Every day | ORAL | Status: DC

## 2017-08-14 MED ORDER — ACETAMINOPHEN 650 MG RE SUPP: 650.0000 mg | Freq: Four times a day (QID) | RECTAL | Status: DC | PRN

## 2017-08-14 MED ORDER — ALUM & MAG HYDROXIDE-SIMETH 200-200-20 MG/5ML PO SUSP: 30.0000 mL | ORAL | Status: DC | PRN

## 2017-08-14 MED ORDER — METHOCARBAMOL 1000 MG/10ML IJ SOLN
500.0000 mg | Freq: Four times a day (QID) | INTRAVENOUS | Status: DC | PRN
  Filled 2017-08-14: qty 5

## 2017-08-14 MED ORDER — SENNA-DOCUSATE SODIUM 8.6-50 MG PO TABS: 2.0000 | ORAL_TABLET | Freq: Every day | ORAL | 1 refills | Status: AC

## 2017-08-14 MED ORDER — PROPOFOL 10 MG/ML IV BOLUS
INTRAVENOUS | Status: AC
  Filled 2017-08-14: qty 20

## 2017-08-14 MED ORDER — ACETAMINOPHEN 500 MG PO TABS: 1000.0000 mg | ORAL_TABLET | Freq: Once | ORAL | Status: DC

## 2017-08-14 MED ORDER — POLYETHYLENE GLYCOL 3350 17 G PO PACK: 17.0000 g | PACK | Freq: Every day | ORAL | Status: DC | PRN

## 2017-08-14 MED ORDER — LACTATED RINGERS IV SOLN
INTRAVENOUS | Status: DC | PRN
  Administered 2017-08-14 (×2): via INTRAVENOUS

## 2017-08-14 MED ORDER — DOCUSATE SODIUM 100 MG PO CAPS: 100.0000 mg | ORAL_CAPSULE | Freq: Two times a day (BID) | ORAL | Status: DC

## 2017-08-14 MED ORDER — CEFAZOLIN SODIUM-DEXTROSE 2-4 GM/100ML-% IV SOLN
INTRAVENOUS | Status: AC
  Filled 2017-08-14: qty 100

## 2017-08-14 MED ORDER — ACETAMINOPHEN 325 MG PO TABS
650.0000 mg | ORAL_TABLET | Freq: Four times a day (QID) | ORAL | Status: DC | PRN
  Administered 2017-08-15 – 2017-08-16 (×2): 650 mg via ORAL
  Filled 2017-08-14 (×2): qty 2

## 2017-08-14 MED ORDER — HYDROCODONE-ACETAMINOPHEN 5-325 MG PO TABS: 1.0000 | ORAL_TABLET | Freq: Four times a day (QID) | ORAL | Status: DC | PRN

## 2017-08-14 MED ORDER — PROSIGHT PO TABS
1.0000 | ORAL_TABLET | Freq: Every day | ORAL | Status: DC
  Administered 2017-08-14 – 2017-08-17 (×4): 1 via ORAL
  Filled 2017-08-14 (×4): qty 1

## 2017-08-14 MED ORDER — FERROUS SULFATE 325 (65 FE) MG PO TABS
325.0000 mg | ORAL_TABLET | Freq: Three times a day (TID) | ORAL | Status: DC
  Administered 2017-08-15 – 2017-08-17 (×9): 325 mg via ORAL
  Filled 2017-08-14 (×9): qty 1

## 2017-08-14 MED ORDER — ONDANSETRON HCL 4 MG/2ML IJ SOLN: 4.0000 mg | Freq: Four times a day (QID) | INTRAMUSCULAR | Status: DC | PRN

## 2017-08-14 MED ORDER — AMLODIPINE BESYLATE 5 MG PO TABS
5.0000 mg | ORAL_TABLET | Freq: Every day | ORAL | Status: DC
  Administered 2017-08-14 – 2017-08-17 (×4): 5 mg via ORAL
  Filled 2017-08-14 (×4): qty 1

## 2017-08-14 MED ORDER — SODIUM CHLORIDE 0.9 % IV SOLN
75.0000 mL/h | INTRAVENOUS | Status: DC
  Administered 2017-08-14 – 2017-08-16 (×3): 75 mL/h via INTRAVENOUS

## 2017-08-14 MED ORDER — FENTANYL CITRATE (PF) 250 MCG/5ML IJ SOLN
INTRAMUSCULAR | Status: DC | PRN
  Administered 2017-08-14: 50 ug via INTRAVENOUS
  Administered 2017-08-14 (×2): 25 ug via INTRAVENOUS
  Administered 2017-08-14: 50 ug via INTRAVENOUS

## 2017-08-14 MED ORDER — EPHEDRINE SULFATE-NACL 50-0.9 MG/10ML-% IV SOSY
PREFILLED_SYRINGE | INTRAVENOUS | Status: DC | PRN
  Administered 2017-08-14 (×3): 5 mg via INTRAVENOUS
  Administered 2017-08-14: 10 mg via INTRAVENOUS

## 2017-08-14 MED ORDER — CHLORHEXIDINE GLUCONATE 4 % EX LIQD: 60.0000 mL | Freq: Once | CUTANEOUS | Status: DC

## 2017-08-14 MED ORDER — DEXAMETHASONE SODIUM PHOSPHATE 10 MG/ML IJ SOLN
INTRAMUSCULAR | Status: AC
  Filled 2017-08-14: qty 1

## 2017-08-14 MED ORDER — ACETAMINOPHEN 325 MG PO TABS: 325.0000 mg | ORAL_TABLET | ORAL | Status: DC | PRN

## 2017-08-14 MED ORDER — BISACODYL 10 MG RE SUPP: 10.0000 mg | Freq: Every day | RECTAL | Status: DC | PRN

## 2017-08-14 MED ORDER — CEFAZOLIN SODIUM-DEXTROSE 1-4 GM/50ML-% IV SOLN
1.0000 g | Freq: Two times a day (BID) | INTRAVENOUS | Status: AC
  Administered 2017-08-15 (×2): 1 g via INTRAVENOUS
  Filled 2017-08-14 (×2): qty 50

## 2017-08-14 MED ORDER — PHENYLEPHRINE 40 MCG/ML (10ML) SYRINGE FOR IV PUSH (FOR BLOOD PRESSURE SUPPORT)
PREFILLED_SYRINGE | INTRAVENOUS | Status: DC | PRN
  Administered 2017-08-14: 80 ug via INTRAVENOUS
  Administered 2017-08-14: 40 ug via INTRAVENOUS

## 2017-08-14 MED ORDER — METHOCARBAMOL 500 MG PO TABS: 500.0000 mg | ORAL_TABLET | Freq: Four times a day (QID) | ORAL | Status: DC | PRN

## 2017-08-14 MED ORDER — VITAMIN B-12 1000 MCG PO TABS
1000.0000 ug | ORAL_TABLET | Freq: Every day | ORAL | Status: DC
  Administered 2017-08-14 – 2017-08-17 (×4): 1000 ug via ORAL
  Filled 2017-08-14 (×4): qty 1

## 2017-08-14 MED ORDER — SODIUM CHLORIDE 0.9 % IV SOLN
INTRAVENOUS | Status: DC
  Administered 2017-08-14: 75 mL/h via INTRAVENOUS
  Administered 2017-08-16: 15:00:00 via INTRAVENOUS

## 2017-08-14 MED ORDER — ACETAMINOPHEN 160 MG/5ML PO SOLN: 325.0000 mg | ORAL | Status: DC | PRN

## 2017-08-14 MED ORDER — OXYCODONE HCL 5 MG/5ML PO SOLN: 5.0000 mg | Freq: Once | ORAL | Status: DC | PRN

## 2017-08-14 MED ORDER — PHENOL 1.4 % MT LIQD: 1.0000 | OROMUCOSAL | Status: DC | PRN

## 2017-08-14 MED ORDER — DOCUSATE SODIUM 100 MG PO CAPS
100.0000 mg | ORAL_CAPSULE | Freq: Two times a day (BID) | ORAL | Status: DC
  Administered 2017-08-14 – 2017-08-17 (×6): 100 mg via ORAL
  Filled 2017-08-14 (×6): qty 1

## 2017-08-14 MED ORDER — ROCURONIUM BROMIDE 50 MG/5ML IV SOSY
PREFILLED_SYRINGE | INTRAVENOUS | Status: AC
  Filled 2017-08-14: qty 5

## 2017-08-14 SURGICAL SUPPLY — 51 items
BIT DRILL 2.8X128 (BIT) ×1 IMPLANT
BIT DRILL 2.8X128MM (BIT) ×1
BLADE SAW SAG 73X25 THK (BLADE) ×1
BLADE SAW SGTL 73X25 THK (BLADE) ×2 IMPLANT
BRUSH FEMORAL CANAL (MISCELLANEOUS) ×1 IMPLANT
CAPT HIP HEMI 1 ×2 IMPLANT
CLOSURE WOUND 1/2 X4 (GAUZE/BANDAGES/DRESSINGS) ×1
CLOSURE WOUND 1/4X4 (GAUZE/BANDAGES/DRESSINGS) ×2
COVER SURGICAL LIGHT HANDLE (MISCELLANEOUS) ×3 IMPLANT
DRAPE INCISE IOBAN 66X45 STRL (DRAPES) ×3 IMPLANT
DRAPE ORTHO SPLIT 77X108 STRL (DRAPES) ×6
DRAPE POUCH INSTRU U-SHP 10X18 (DRAPES) ×3 IMPLANT
DRAPE SURG ORHT 6 SPLT 77X108 (DRAPES) ×2 IMPLANT
DRAPE U-SHAPE 47X51 STRL (DRAPES) ×3 IMPLANT
DRILL BIT 2.0 (BIT) ×1 IMPLANT
DRSG MEPILEX BORDER 4X8 (GAUZE/BANDAGES/DRESSINGS) ×3 IMPLANT
DURAPREP 26ML APPLICATOR (WOUND CARE) ×3 IMPLANT
ELECT REM PT RETURN 15FT ADLT (MISCELLANEOUS) ×3 IMPLANT
FACESHIELD WRAPAROUND (MASK) ×9 IMPLANT
FACESHIELD WRAPAROUND OR TEAM (MASK) ×4 IMPLANT
GLOVE BIOGEL PI IND STRL 8 (GLOVE) ×1 IMPLANT
GLOVE BIOGEL PI INDICATOR 8 (GLOVE) ×2
GLOVE ECLIPSE 7.5 STRL STRAW (GLOVE) ×3 IMPLANT
GLOVE ORTHO TXT STRL SZ7.5 (GLOVE) ×3 IMPLANT
GOWN STRL REUS W/TWL LRG LVL3 (GOWN DISPOSABLE) ×3 IMPLANT
HANDPIECE INTERPULSE COAX TIP (DISPOSABLE)
KIT BASIN OR (CUSTOM PROCEDURE TRAY) ×3 IMPLANT
NDL MA TROC 1/2 (NEEDLE) ×1 IMPLANT
NEEDLE MA TROC 1/2 (NEEDLE) IMPLANT
NS IRRIG 1000ML POUR BTL (IV SOLUTION) ×3 IMPLANT
PACK TOTAL JOINT (CUSTOM PROCEDURE TRAY) ×3 IMPLANT
PASSER SUT SWANSON 36MM LOOP (INSTRUMENTS) ×3 IMPLANT
POSITIONER SURGICAL ARM (MISCELLANEOUS) ×3 IMPLANT
PRESSURIZER FEMORAL UNIV (MISCELLANEOUS) ×1 IMPLANT
SET HNDPC FAN SPRY TIP SCT (DISPOSABLE) ×1 IMPLANT
SPONGE LAP 4X18 X RAY DECT (DISPOSABLE) ×1 IMPLANT
STRIP CLOSURE SKIN 1/2X4 (GAUZE/BANDAGES/DRESSINGS) ×1 IMPLANT
STRIP CLOSURE SKIN 1/4X4 (GAUZE/BANDAGES/DRESSINGS) ×4 IMPLANT
SUT ETHIBOND NAB CT1 #1 30IN (SUTURE) ×6 IMPLANT
SUT FIBERWIRE #2 38 T-5 BLUE (SUTURE) ×9
SUT MNCRL AB 4-0 PS2 18 (SUTURE) ×3 IMPLANT
SUT VIC AB 1 CT1 27 (SUTURE)
SUT VIC AB 1 CT1 27XBRD ANTBC (SUTURE) IMPLANT
SUT VIC AB 2-0 CT1 27 (SUTURE) ×9
SUT VIC AB 2-0 CT1 TAPERPNT 27 (SUTURE) ×2 IMPLANT
SUT VIC AB 3-0 SH 8-18 (SUTURE) ×3 IMPLANT
SUTURE FIBERWR #2 38 T-5 BLUE (SUTURE) ×3 IMPLANT
TOWEL OR 17X26 10 PK STRL BLUE (TOWEL DISPOSABLE) ×9 IMPLANT
TOWER CARTRIDGE SMART MIX (DISPOSABLE) ×1 IMPLANT
TRAY FOLEY W/METER SILVER 16FR (SET/KITS/TRAYS/PACK) IMPLANT
WATER STERILE IRR 1000ML POUR (IV SOLUTION) ×3 IMPLANT

## 2017-08-14 NOTE — Transfer of Care (Signed)
Immediate Anesthesia Transfer of Care Note  Patient: Garrett Snow  Procedure(s) Performed: ARTHROPLASTY BIPOLAR HIP (HEMIARTHROPLASTY) (Right )  Patient Location: PACU  Anesthesia Type:General  Level of Consciousness: drowsy  Airway & Oxygen Therapy: Patient Spontanous Breathing and Patient connected to face mask  Post-op Assessment: Report given to RN and Post -op Vital signs reviewed and stable  Post vital signs: Reviewed and stable  Last Vitals:  Vitals:   08/14/17 1500 08/14/17 1545  BP: (!) 122/57 (!) 127/54  Pulse: (!) 44 (!) 50  Resp:  16  Temp:    SpO2: 98% 97%    Last Pain:  Vitals:   08/14/17 1253  TempSrc:   PainSc: 2          Complications: No apparent anesthesia complications

## 2017-08-14 NOTE — Anesthesia Procedure Notes (Signed)
Procedure Name: Intubation Date/Time: 08/14/2017 5:57 PM Performed by: Lollie Sails, CRNA Pre-anesthesia Checklist: Patient identified, Emergency Drugs available, Suction available, Patient being monitored and Timeout performed Patient Re-evaluated:Patient Re-evaluated prior to induction Oxygen Delivery Method: Circle system utilized Preoxygenation: Pre-oxygenation with 100% oxygen Induction Type: IV induction Ventilation: Mask ventilation without difficulty Laryngoscope Size: Miller and 3 Grade View: Grade I Tube type: Oral Tube size: 7.5 mm Number of attempts: 1 Airway Equipment and Method: Stylet Placement Confirmation: ETT inserted through vocal cords under direct vision,  positive ETCO2 and breath sounds checked- equal and bilateral Secured at: 22 cm Tube secured with: Tape Dental Injury: Teeth and Oropharynx as per pre-operative assessment

## 2017-08-14 NOTE — ED Provider Notes (Signed)
[facility] Provider Note   CSN: [id number] Arrival date & time: 08/14/17  8144     History   Chief Complaint Chief Complaint  Patient presents with  . Fall    HPI Garrett Snow is a 83 y.o. male.  HPI   83yo male presents with concern for fall and right hip pain. Had a fall 2 days ago, and has had pain since then, then fell again last night and this AM. Denies headache, LOC, neck pain, back pain. Reports only pain in hip.  Worse with movement.  Had been walking on sore hip for last 2 days however after falling again at 6AM was transported to ED prior to ambulating again. No numbness or weakness.  No recent illness. Lives with his son.   Past Medical History:  Diagnosis Date  . AAA (abdominal aortic aneurysm) ([place])   . DJD (degenerative joint disease) 03/07/2012  . Fracture of femoral neck, right ([place]) 08/14/2017  . HTN (hypertension), benign 03/07/2012    Patient Active Problem List   Diagnosis Date Noted  . Fracture of femoral neck, right ([place]) 08/14/2017  . Hip fracture ([place]) 08/14/2017  . AAA (abdominal aortic aneurysm) ([place])   . Stage III carcinoma of colon ([place]) 03/07/2012  . HTN (hypertension), benign 03/07/2012  . DJD (degenerative joint disease) 03/07/2012    History reviewed. No pertinent surgical history.     Home Medications    Prior to Admission medications   Medication Sig Start Date End Date Taking? Authorizing Provider  amLODipine (NORVASC) 5 MG tablet Take 5 mg by mouth daily.   Yes [provider]  Cholecalciferol (VITAMIN D) 2000 units CAPS Take 2,000 Units by mouth daily.   Yes [provider]  cyanocobalamin 500 MCG tablet Take 1,000 mcg by mouth daily.    Yes [provider]  finasteride (PROSCAR) 5 MG tablet Take 5 mg by mouth daily.   Yes [provider]  Multiple Vitamins-Minerals (ICAPS AREDS 2) CAPS Take 1 Can by mouth 2 (two) times daily.   Yes [provider]  sodium chloride (OCEAN) 0.65 % SOLN nasal spray Place 1 spray into both nostrils as needed for congestion.   Yes [provider]  [facility] Wort 150 MG CAPS Take 1 capsule by mouth daily.   Yes [provider]  acetaminophen (TYLENOL) 325 MG tablet Take 2 tablets (650 mg total) by mouth every 6 (six) hours as needed. 08/14/17   [name], MD  enoxaparin (LOVENOX) 40 MG/0.4ML injection Inject 0.4 mLs (40 mg total) into the skin daily. 08/14/17   [name], MD  iron polysaccharides (FERREX 150) 150 MG capsule TAKE ONE CAPSULE BY MOUTH TWICE DAILY Patient not taking: Reported on 08/14/2017 08/31/13   [name], MD  sennosides-docusate sodium (SENOKOT-S) 8.6-50 MG tablet Take 2 tablets by mouth daily. 08/14/17   [name], MD    Family History History reviewed. No pertinent family history.  Social History Social History   Tobacco Use  . Smoking status: Former Smoker    Last attempt to quit: 08/19/1974    Years since quitting: 43.0  . Smokeless tobacco: Never Used  Substance Use Topics  . Alcohol use: No    Alcohol/week: 0.0 oz    Frequency: Never  . Drug use: No     Allergies   Patient has no known allergies.   Review of Systems Review of Systems  Constitutional: Negative for fever.  HENT: Negative  for sore throat.   Eyes: Negative for visual disturbance.  Respiratory: Negative for shortness of breath.   Cardiovascular: Negative for chest pain.  Gastrointestinal: Negative for abdominal pain, nausea and vomiting.  Genitourinary: Negative for difficulty urinating.  Musculoskeletal: Positive for arthralgias and gait problem. Negative for back pain and neck stiffness.  Skin: Negative for rash.  Neurological: Negative for syncope, weakness, numbness and headaches.     Physical Exam Updated Vital Signs BP 139/69 (BP Location: Right Arm)   Pulse (!) 58   Temp 97.6 F (36.4 C)   Resp 16   Ht 6\' 1"  (1.854 m)   Wt  77.1 kg (170 lb)   SpO2 98%   BMI 22.43 kg/m   Physical Exam  Constitutional: He is oriented to person, place, and time. He appears well-developed and well-nourished. No distress.  HENT:  Head: Normocephalic and atraumatic.  Old appearing contusion to left side of head, no skull tenderness, no sign of skull fracture, no racoons eyes, no battle signs, no hemotympanum   Eyes: Conjunctivae and EOM are normal.  Neck: Normal range of motion.  Cardiovascular: Normal rate, regular rhythm, normal heart sounds and intact distal pulses. Exam reveals no gallop and no friction rub.  No murmur heard. Pulmonary/Chest: Effort normal and breath sounds normal. No respiratory distress. He has no wheezes. He has no rales.  Abdominal: Soft. He exhibits no distension. There is no tenderness. There is no guarding.  Musculoskeletal: He exhibits no edema.       Right hip: He exhibits decreased range of motion (reports pain with ROM but flexing and extending), tenderness and bony tenderness.       Cervical back: He exhibits no tenderness and no bony tenderness.       Thoracic back: He exhibits no tenderness and no bony tenderness.       Lumbar back: He exhibits no tenderness and no bony tenderness.  Neurological: He is alert and oriented to person, place, and time. He has normal strength. No cranial nerve deficit or sensory deficit. GCS eye subscore is 4. GCS verbal subscore is 5. GCS motor subscore is 6.  Skin: Skin is warm and dry. He is not diaphoretic.  Nursing note and vitals reviewed.    ED Treatments / Results  Labs (all labs ordered are listed, but only abnormal results are displayed) Labs Reviewed  CBC WITH DIFFERENTIAL/PLATELET - Abnormal; Notable for the following components:      Result Value   RBC 3.70 (*)    Hemoglobin 11.2 (*)    HCT 34.9 (*)    All other components within normal limits  COMPREHENSIVE METABOLIC PANEL - Abnormal; Notable for the following components:   Glucose, Bld 109 (*)     BUN 32 (*)    Creatinine, Ser 1.40 (*)    Total Protein 6.1 (*)    ALT 16 (*)    GFR calc non Af Amer 41 (*)    GFR calc Af Amer 47 (*)    All other components within normal limits  MAGNESIUM  PROTIME-INR  CBC  BASIC METABOLIC PANEL  TYPE AND SCREEN    EKG  EKG Interpretation  Date/Time:  Wednesday August 14 2017 11:19:02 EST Ventricular Rate:  48 PR Interval:    QRS Duration: 164 QT Interval:  475 QTC Calculation: 425 R Axis:   -47 Text Interpretation:  Sinus bradycardia IVCD, consider atypical RBBB LVH with IVCD and secondary repol abnrm No significant change since last tracing Confirmed by Billy Fischer,  Junie Panning [phone]) on 08/14/2017 11:29:51 PM       Radiology Dg Hip Port Unilat With Pelvis 1v Right  Result Date: 08/14/2017 CLINICAL DATA:  Arthroplasty EXAM: DG HIP (WITH OR WITHOUT PELVIS) 1V PORT RIGHT COMPARISON:  08/14/2017 FINDINGS: Status post right hip replacement with normal alignment. Pubic symphysis is intact. Moderate arthritis of the left hip. Gas in the soft tissues consistent with recent operative status. Vascular calcification IMPRESSION: Interval right hip replacement with expected postsurgical changes Electronically Signed   By: Donavan Foil M.D.   On: 08/14/2017 21:10   Dg Hip Unilat W Or Wo Pelvis 2-3 Views Right  Result Date: 08/14/2017 CLINICAL DATA:  Right hip pain and history of fall. EXAM: DG HIP (WITH OR WITHOUT PELVIS) 2-3V RIGHT COMPARISON:  CT 08/25/2009 FINDINGS: Fracture in the proximal right femur. Fracture appears to be involving the right femoral neck. There is superior displacement of the right femoral trochanteric region. Right hip is located. Pelvic bony ring is intact. Left hip is intact. Endplate disease in the lower lumbar spine. IMPRESSION: Fracture of the right femoral neck. Electronically Signed   By: Markus Daft M.D.   On: 08/14/2017 11:09    Procedures Procedures (including critical care time)  Medications Ordered in  ED Medications  fentaNYL (SUBLIMAZE) injection 50 mcg ( Intravenous MAR Unhold 08/14/17 2107)  0.9 %  sodium chloride infusion (75 mL/hr Intravenous New Bag/Given 08/14/17 1207)  amLODipine (NORVASC) tablet 5 mg (5 mg Oral Given 08/14/17 2155)  finasteride (PROSCAR) tablet 5 mg (5 mg Oral Given 08/14/17 2233)  vitamin B-12 (CYANOCOBALAMIN) tablet 1,000 mcg (1,000 mcg Oral Given 08/14/17 2155)  cholecalciferol (VITAMIN D) tablet 2,000 Units (2,000 Units Oral Given 08/14/17 2155)  multivitamin (PROSIGHT) tablet 1 tablet (1 tablet Oral Given 08/14/17 2155)  sodium chloride (OCEAN) 0.65 % nasal spray 1 spray (not administered)  morphine 4 MG/ML injection 0.52 mg (not administered)  methocarbamol (ROBAXIN) tablet 500 mg (not administered)    Or  methocarbamol (ROBAXIN) 500 mg in dextrose 5 % 50 mL IVPB (not administered)  enoxaparin (LOVENOX) injection 40 mg (not administered)  0.9 %  sodium chloride infusion (75 mL/hr Intravenous New Bag/Given 08/14/17 2158)  ceFAZolin (ANCEF) IVPB 1 g/50 mL premix (not administered)  acetaminophen (TYLENOL) tablet 650 mg (not administered)    Or  acetaminophen (TYLENOL) suppository 650 mg (not administered)  docusate sodium (COLACE) capsule 100 mg (100 mg Oral Given 08/14/17 2155)  senna (SENOKOT) tablet 8.6 mg (8.6 mg Oral Given 08/14/17 2155)  polyethylene glycol (MIRALAX / GLYCOLAX) packet 17 g (not administered)  bisacodyl (DULCOLAX) suppository 10 mg (not administered)  magnesium citrate solution 1 Bottle (not administered)  ondansetron (ZOFRAN) tablet 4 mg (not administered)    Or  ondansetron (ZOFRAN) injection 4 mg (not administered)  alum & mag hydroxide-simeth (MAALOX/MYLANTA) 200-200-20 MG/5ML suspension 30 mL (not administered)  ferrous sulfate tablet 325 mg (not administered)  menthol-cetylpyridinium (CEPACOL) lozenge 3 mg (not administered)    Or  phenol (CHLORASEPTIC) mouth spray 1 spray (not administered)  HYDROcodone-acetaminophen  (NORCO/VICODIN) 5-325 MG per tablet 1 tablet (not administered)  ceFAZolin (ANCEF) IVPB 2g/100 mL premix (2 g Intravenous Given 08/14/17 1800)  ceFAZolin (ANCEF) 2-4 GM/100ML-% IVPB (  Override pull for Anesthesia 08/14/17 1800)     Initial Impression / Assessment and Plan / ED Course  I have reviewed the triage vital signs and the nursing notes.  Pertinent labs & imaging results that were available during my care of the patient were reviewed  by me and considered in my medical decision making (see chart for details).     83yo male presents with concern for fall and right hip pain. Denies other injuries or areas of pain on history and physical exam.  Pt with normal mentation, no headache, no nausea or vomiting, no sign of skull fracture or hx of LOC and have low suspicion for intracranial bleed at this time. Not on anticoagulation.  Neuro exam WNL.  No recent illness.    XR shows right hip fracture. NV intact and closed. Discussed with [name] of Orthopedic surgery. Plan [facility] at [facility] tomorrow if medically cleared. Admitted to hospitalist for further care.    Final Clinical Impressions(s) / ED Diagnoses   Final diagnoses:  Fall, initial encounter  Closed fracture of neck of right femur, initial encounter [facility]    ED Discharge Orders        Ordered    sennosides-docusate sodium (SENOKOT-S) 8.6-50 MG tablet  Daily     08/14/17 1955    Weight bearing as tolerated     08/14/17 1955    enoxaparin (LOVENOX) 40 MG/0.4ML injection  Every 24 hours     08/14/17 1955    acetaminophen (TYLENOL) 325 MG tablet  Every 6 hours PRN    Comments:  Total Tylenol dose for a 24-hour period should not exceed 3000 mg. Please be sensitive to any other medications that may include acetaminophen/Tylenol.   08/14/17 1955       Gareth Morgan, MD 08/14/17 2330

## 2017-08-14 NOTE — ED Triage Notes (Signed)
EMS reports from home fall backwards x 2 days, c/o right hip pain, visible rotation, unable to straighten.   BP 122/64 HR 50  sp02 97 RA CBG 145

## 2017-08-14 NOTE — Consult Note (Signed)
Reason for Consult:Right femoral neck fx Referring Physician: E Schlossman  Garrett Snow is an 83 y.o. male.  HPI: Garrett Snow was ambulating to the bathroom and fell. He did it once a couple of days ago and was really favoring that hip. He fell again this morning and family called EMS. X-rays showed a femoral neck fx and orthopedic surgery was consulted. He generally ambulates with a RW except when he gets up to go to bathroom from bed, which is often. He is very HoH.  Past Medical History:  Diagnosis Date  . AAA (abdominal aortic aneurysm) ([place])   . DJD (degenerative joint disease) 03/07/2012  . HTN (hypertension), benign 03/07/2012    History reviewed. No pertinent surgical history.  History reviewed. No pertinent family history.  Social History:  reports that he quit smoking about 43 years ago. he has never used smokeless tobacco. He reports that he does not drink alcohol or use drugs.  Allergies: No Known Allergies  Medications: I have reviewed the patient's current medications.  Results for orders placed or performed during the hospital encounter of 08/14/17 (from the past 48 hour(s))  CBC with Differential     Status: Abnormal   Collection Time: 08/14/17 11:44 AM  Result Value Ref Range   WBC 7.6 4.0 - 10.5 K/uL   RBC 3.70 (L) 4.22 - 5.81 MIL/uL   Hemoglobin 11.2 (L) 13.0 - 17.0 g/dL   HCT 34.9 (L) 39.0 - 52.0 %   MCV 94.3 78.0 - 100.0 fL   MCH 30.3 26.0 - 34.0 pg   MCHC 32.1 30.0 - 36.0 g/dL   RDW 13.3 11.5 - 15.5 %   Platelets 282 150 - 400 K/uL   Neutrophils Relative % 70 %   Neutro Abs 5.3 1.7 - 7.7 K/uL   Lymphocytes Relative 18 %   Lymphs Abs 1.3 0.7 - 4.0 K/uL   Monocytes Relative 11 %   Monocytes Absolute 0.8 0.1 - 1.0 K/uL   Eosinophils Relative 1 %   Eosinophils Absolute 0.1 0.0 - 0.7 K/uL   Basophils Relative 0 %   Basophils Absolute 0.0 0.0 - 0.1 K/uL  Comprehensive metabolic panel     Status: Abnormal   Collection Time: 08/14/17 11:44 AM  Result Value Ref  Range   Sodium 140 135 - 145 mmol/L   Potassium 4.1 3.5 - 5.1 mmol/L   Chloride 111 101 - 111 mmol/L   CO2 23 22 - 32 mmol/L   Glucose, Bld 109 (H) 65 - 99 mg/dL   BUN 32 (H) 6 - 20 mg/dL   Creatinine, Ser 1.40 (H) 0.61 - 1.24 mg/dL   Calcium 9.3 8.9 - 10.3 mg/dL   Total Protein 6.1 (L) 6.5 - 8.1 g/dL   Albumin 3.5 3.5 - 5.0 g/dL   AST 25 15 - 41 U/L   ALT 16 (L) 17 - 63 U/L   Alkaline Phosphatase 109 38 - 126 U/L   Total Bilirubin 0.7 0.3 - 1.2 mg/dL   GFR calc non Af Amer 41 (L) >60 mL/min   GFR calc Af Amer 47 (L) >60 mL/min    Comment: (NOTE) The eGFR has been calculated using the CKD EPI equation. This calculation has not been validated in all clinical situations. eGFR's persistently <60 mL/min signify possible Chronic Kidney Disease.    Anion gap 6 5 - 15  Type and screen [place]     Status: None (Preliminary result)   Collection Time: 08/14/17 11:44 AM  Result Value Ref Range   ABO/RH(D) O POS    Antibody Screen PENDING    Sample Expiration 08/17/2017     Dg Hip Unilat W Or Wo Pelvis 2-3 Views Right  Result Date: 08/14/2017 CLINICAL DATA:  Right hip pain and history of fall. EXAM: DG HIP (WITH OR WITHOUT PELVIS) 2-3V RIGHT COMPARISON:  CT 08/25/2009 FINDINGS: Fracture in the proximal right femur. Fracture appears to be involving the right femoral neck. There is superior displacement of the right femoral trochanteric region. Right hip is located. Pelvic bony ring is intact. Left hip is intact. Endplate disease in the lower lumbar spine. IMPRESSION: Fracture of the right femoral neck. Electronically Signed   By: Markus Daft M.D.   On: 08/14/2017 11:09    Review of Systems  Constitutional: Negative for weight loss.  HENT: Negative for ear discharge, ear pain, hearing loss and tinnitus.   Eyes: Negative for blurred vision, double vision, photophobia and pain.  Respiratory: Negative for cough, sputum production and shortness of breath.    Cardiovascular: Negative for chest pain.  Gastrointestinal: Negative for abdominal pain, nausea and vomiting.  Genitourinary: Negative for dysuria, flank pain, frequency and urgency.  Musculoskeletal: Positive for falls and joint pain (Right hip\). Negative for back pain, myalgias and neck pain.  Neurological: Negative for dizziness, tingling, sensory change, focal weakness, loss of consciousness and headaches.  Endo/Heme/Allergies: Does not bruise/bleed easily.  Psychiatric/Behavioral: Negative for depression, memory loss and substance abuse. The patient is not nervous/anxious.    Blood pressure (!) 106/52, pulse (!) 51, temperature 98.4 F (36.9 C), temperature source Oral, resp. rate 12, height _0  (1.854 m), weight 77.1 kg (170 lb), SpO2 95 %. Physical Exam  Constitutional: He appears well-developed and well-nourished. No distress.  HENT:  Head: Normocephalic.  Eyes: Conjunctivae are normal. Right eye exhibits no discharge. Left eye exhibits no discharge. No scleral icterus.  Neck: Normal range of motion.  Cardiovascular: Normal rate and regular rhythm.  Respiratory: Effort normal. No respiratory distress.  Musculoskeletal:  Bilateral shoulder, elbow, wrist, digits- no skin wounds, nontender, no instability, no blocks to motion  Sens  Ax/R/M/U intact  Mot   Ax/ R/ PIN/ M/ AIN/ U intact  Rad 2+  LLE No traumatic wounds, ecchymosis, or rash  Nontender  No knee or ankle effusion  Knee stable to varus/ valgus and anterior/posterior stress  Sens DPN, SPN, TN intact  Motor EHL, ext, flex, evers 5/5  DP 2+, PT 2+, No significant edema  RLE No traumatic wounds, ecchymosis, or rash  Mild-mod TTP hip  No knee or ankle effusion  Knee stable to varus/ valgus and anterior/posterior stress  Sens DPN, SPN, TN intact  Motor EHL, ext, flex, evers 5/5  DP 2+, PT 1+, No significant edema  Neurological: He is alert.  Skin: Skin is warm and dry. He is not diaphoretic.  Psychiatric: He has  a normal mood and affect. His behavior is normal.    Assessment/Plan: Fall (hx of mult recent falls) Right femoral neck fx -- Will need hemiarthroplasty tomorrow afternoon by [name]. Please keep NPO after MN. IM to admit for medical clearance.    Lisette Abu, PA-C Orthopedic Surgery [phone] 08/14/2017, 1:00 PM

## 2017-08-14 NOTE — Progress Notes (Signed)
PHARMACY NOTE:  ANTIMICROBIAL RENAL DOSAGE ADJUSTMENT  Current antimicrobial regimen includes a mismatch between antimicrobial dosage and estimated renal function.  As per policy approved by the Pharmacy & Therapeutics and Medical Executive Committees, the antimicrobial dosage will be adjusted accordingly.  Current antimicrobial dosage: Cefazolin 2g IV q6h x 2 doses post-operatively  Indication: Surgical prophylaxis  Renal Function:  Estimated Creatinine Clearance: 33.7 mL/min (A) (by C-G formula based on SCr of 1.4 mg/dL (H)). []      On intermittent HD, scheduled: []      On CRRT    Antimicrobial dosage has been changed to:  Cefazolin 1g IV q12h x 2 doses    Thank you for allowing pharmacy to be a part of this patient's care.  Luiz Ochoa, [facility] 08/14/2017 9:37 PM

## 2017-08-14 NOTE — Discharge Instructions (Signed)

## 2017-08-14 NOTE — Op Note (Signed)
08/14/2017  7:46 PM  PATIENT:  Garrett Snow   MRN: [id number]  PRE-OPERATIVE DIAGNOSIS:  right hip femoral neck fracture  POST-OPERATIVE DIAGNOSIS:  right hip femoral neck fracture  PROCEDURE:  Procedure(s): ARTHROPLASTY BIPOLAR HIP (HEMIARTHROPLASTY)  PREOPERATIVE INDICATIONS:  Garrett Snow is an 83 y.o. male who was admitted 08/14/2017 with a diagnosis of Fracture of femoral neck, right ([place]) and elected for surgical management.  The risks benefits and alternatives were discussed with the patient including but not limited to the risks of nonoperative treatment, versus surgical intervention including infection, bleeding, nerve injury, periprosthetic fracture, the need for revision surgery, dislocation, leg length discrepancy, blood clots, cardiopulmonary complications, morbidity, mortality, among others, and they were willing to proceed.  Predicted outcome is good, although there will be at least a six to nine month expected recovery.   OPERATIVE REPORT     SURGEON:  [name], MD    ASSISTANT:  Joya Gaskins, OPA-C  (Present throughout the entire procedure,  necessary for completion of procedure in a timely manner, assisting with retraction, instrumentation, and closure)     ANESTHESIA:  General  ESTIMATED BLOOD LOSS: 235 ml    COMPLICATIONS:  None.   UNIQUE ASPECTS OF THE CASE:  The femoral head was extremely large.  I trialed off the 7, and it was slightly proud, but had a little bit of rotational instability after the trial was completed, upon examining the broach, however I was able to seat the stem another 2 mm, which had it flushed in the appropriate position, and the trial was with the -3, and for that reason I ended up using a 7 which sat down completely, had excellent rotational stability, and I went up one length to a 0 spacer which restored leg lengths anatomically.  I was concerned that going up on the femoral size would increase periprosthetic fracture risk, so I  maintained the size 7.  The size 7 had a slightly tiney sound during placement.  COMPONENTS:  Chemical engineer Femoral Fracture stem size 7, with a 0 spacer and a size 56 fracture head unipolar hip ball.    PROCEDURE IN DETAIL: The patient was met in the holding area and identified.  The appropriate hip  was marked at the operative site. The patient was then transported to the OR and  placed under general anesthesia.  At that point, the patient was  placed in the lateral decubitus position with the operative side up and  secured to the operating room table and all bony prominences padded.     The operative lower extremity was prepped from the iliac crest to the toes.  Sterile draping was performed.  Time out was performed prior to incision.      A routine posterolateral approach was utilized via sharp dissection  carried down to the subcutaneous tissue.  Gross bleeders were Bovie  coagulated.  The iliotibial band was identified and incised  along the length of the skin incision.  Self-retaining retractors were  inserted.  With the hip internally rotated, the short external rotators  were identified. The piriformis was tagged with FiberWire, and the hip capsule released in a T-type fashion.  The femoral neck was exposed, and I resected the femoral neck using the appropriate jig. This was performed at approximately a thumb's breadth above the lesser trochanter.    I then exposed the deep acetabulum, cleared out any tissue including the ligamentum teres, and included the hip capsule in the FiberWire  used above and below the T.    I then prepared the proximal femur using the cookie-cutter, the lateralizing reamer, and then sequentially broached.  A trial utilized, and I reduced the hip and it was found to have excellent stability with functional range of motion. The trial components were then removed.   I then place the real implant and I impacted the real head ball into place. The hip was  then reduced and taken through functional range of motion and found to have excellent stability. Leg lengths were restored.  I then used a 2 mm drill bits to pass the FiberWire suture from the capsule and piriformis through the greater trochanter, and secured this. Excellent posterior capsular repair was achieved. I also closed the T in the capsule.  I then irrigated the hip copiously again with pulse lavage, and repaired the fascia with Vicryl, followed by Vicryl for the subcutaneous tissue, Monocryl for the skin, Steri-Strips and sterile gauze. The wounds were injected. The patient was then awakened and returned to PACU in stable and satisfactory condition. There were no complications.  [name], MD Orthopedic Surgeon [phone]   08/14/2017 7:46 PM

## 2017-08-14 NOTE — Progress Notes (Signed)
PHARMACIST - PHYSICIAN ORDER COMMUNICATION  CONCERNING: P&T Medication Policy on Herbal Medications  DESCRIPTION:  This patient's order for: [place] Wort has been noted.  This product(s) is classified as an "herbal" or natural product. Due to a lack of definitive safety studies or FDA approval, nonstandard manufacturing practices, plus the potential risk of unknown drug-drug interactions while on inpatient medications, the Pharmacy and Therapeutics Committee does not permit the use of "herbal" or natural products of this type within [facility].   ACTION TAKEN: The pharmacy department is unable to verify this order at this time and your patient has been informed of this safety policy. Please reevaluate patient's clinical condition at discharge and address if the herbal or natural product(s) should be resumed at that time.   Lindell Spar, PharmD, BCPS Pager: [phone] 08/14/2017 9:33 PM

## 2017-08-14 NOTE — Anesthesia Preprocedure Evaluation (Signed)
Anesthesia Evaluation  Patient identified by MRN, date of birth, ID band Patient awake    Reviewed: Allergy & Precautions, NPO status , Patient's Chart, lab work & pertinent test results  History of Anesthesia Complications Negative for: history of anesthetic complications  Airway Mallampati: II  TM Distance: >3 FB Neck ROM: Full    Dental  (+) Edentulous Upper, Edentulous Lower   Pulmonary neg shortness of breath, neg sleep apnea, neg COPD, neg recent URI, former smoker,    breath sounds clear to auscultation       Cardiovascular hypertension, Pt. on medications + Peripheral Vascular Disease   Rhythm:Regular     Neuro/Psych negative neurological ROS  negative psych ROS   GI/Hepatic negative GI ROS, Neg liver ROS,   Endo/Other    Renal/GU Renal InsufficiencyRenal disease     Musculoskeletal  (+) Arthritis , Right hip fx   Abdominal   Peds  Hematology  (+) anemia ,   Anesthesia Other Findings HTN, stable AAA, h/o PTT >40 with no new lab, medication changes per family however unsure of agent which caused original elevated PTT  Reproductive/Obstetrics                             Anesthesia Physical Anesthesia Plan  ASA: II  Anesthesia Plan: General   Post-op Pain Management:    Induction: Intravenous  PONV Risk Score and Plan: 2 and Ondansetron and Treatment may vary due to age or medical condition  Airway Management Planned: Oral ETT  Additional Equipment: None  Intra-op Plan:   Post-operative Plan: Extubation in OR  Informed Consent: I have reviewed the patients History and Physical, chart, labs and discussed the procedure including the risks, benefits and alternatives for the proposed anesthesia with the patient or authorized representative who has indicated his/her understanding and acceptance.   Dental advisory given  Plan Discussed with: CRNA and Surgeon  Anesthesia  Plan Comments:         Anesthesia Quick Evaluation

## 2017-08-14 NOTE — ED Notes (Signed)
Bed: YS16 Expected date:  Expected time:  Means of arrival:  Comments: EMS-hip pain

## 2017-08-14 NOTE — H&P (Signed)
History and Physical    Garrett Snow OZD:[id number] DOB: 1919/02/28 DOA: 08/14/2017  PCP: [name], MD  Patient coming from:   home  Chief Complaint:  Fall, rt hip pain  HPI: Garrett Snow is a 83 y.o. male with medical history significant of AAA, HTN lives at home with his son comes in after suffering several falls in the last couple of weeks.  He just started walking with a walker due to the falls and imbalance.  However the last 2-3 days he has been having right leg pain.  Not as active.  No other illnesses.  Pt found to have right hip fx and referred for admission for surgical repair.  Review of Systems: As per HPI otherwise 10 point review of systems negative.   Past Medical History:  Diagnosis Date  . AAA (abdominal aortic aneurysm) ([place])   . DJD (degenerative joint disease) 03/07/2012  . HTN (hypertension), benign 03/07/2012    History reviewed. No pertinent surgical history.   reports that he quit smoking about 43 years ago. he has never used smokeless tobacco. He reports that he does not drink alcohol or use drugs.  No Known Allergies  History reviewed. No pertinent family history. no premature CAD  Prior to Admission medications   Medication Sig Start Date End Date Taking? Authorizing Provider  amLODipine (NORVASC) 5 MG tablet Take 5 mg by mouth daily.   Yes [provider]  Cholecalciferol (VITAMIN D) 2000 units CAPS Take 2,000 Units by mouth daily.   Yes [provider]  cyanocobalamin 500 MCG tablet Take 1,000 mcg by mouth daily.    Yes [provider]  finasteride (PROSCAR) 5 MG tablet Take 5 mg by mouth daily.   Yes [provider]  Multiple Vitamins-Minerals (ICAPS AREDS 2) CAPS Take 1 Can by mouth 2 (two) times daily.   Yes [provider]  sodium chloride (OCEAN) 0.65 % SOLN nasal spray Place 1 spray into both nostrils as needed for congestion.   Yes [provider]  [facility] Wort 150 MG CAPS Take 1  capsule by mouth daily.   Yes [provider]  iron polysaccharides (FERREX 150) 150 MG capsule TAKE ONE CAPSULE BY MOUTH TWICE DAILY Patient not taking: Reported on 08/14/2017 08/31/13   [name], MD    Physical Exam: Vitals:   08/14/17 1059 08/14/17 1200 08/14/17 1245 08/14/17 1330  BP: (!) 124/57 (!) 128/57 (!) 106/52 (!) 123/48  Pulse: (!) 50 (!) 50 (!) 51 (!) 48  Resp: 16 13 12 11   Temp:      TempSrc:      SpO2: 95% 96% 95% 98%  Weight:      Height:          Constitutional: NAD, calm, comfortable Vitals:   08/14/17 1059 08/14/17 1200 08/14/17 1245 08/14/17 1330  BP: (!) 124/57 (!) 128/57 (!) 106/52 (!) 123/48  Pulse: (!) 50 (!) 50 (!) 51 (!) 48  Resp: 16 13 12 11   Temp:      TempSrc:      SpO2: 95% 96% 95% 98%  Weight:      Height:       Eyes: PERRL, lids and conjunctivae normal ENMT: Mucous membranes are moist. Posterior pharynx clear of any exudate or lesions.Normal dentition.  Neck: normal, supple, no masses, no thyromegaly Respiratory: clear to auscultation bilaterally, no wheezing, no crackles. Normal respiratory effort. No accessory muscle use.  Cardiovascular: Regular rate and rhythm, no murmurs /  rubs / gallops. No extremity edema. 2+ pedal pulses. No carotid bruits.  Abdomen: no tenderness, no masses palpated. No hepatosplenomegaly. Bowel sounds positive.  Musculoskeletal: no clubbing / cyanosis. No joint deformity upper and lower extremities. Good ROM, no contractures. Normal muscle tone.  Skin: no rashes, lesions, ulcers. No induration Neurologic: CN 2-12 grossly intact. Sensation intact, DTR normal. Strength 5/5 in all 4.  Psychiatric: Normal judgment and insight. Alert and oriented x 2. Normal mood.    Labs on Admission: I have personally reviewed following labs and imaging studies  CBC: Recent Labs  Lab 08/14/17 1144  WBC 7.6  NEUTROABS 5.3  HGB 11.2*  HCT 34.9*  MCV 94.3  PLT 053   Basic Metabolic Panel: Recent Labs    Lab 08/14/17 1144  NA 140  K 4.1  CL 111  CO2 23  GLUCOSE 109*  BUN 32*  CREATININE 1.40*  CALCIUM 9.3   GFR: Estimated Creatinine Clearance: 33.7 mL/min (A) (by C-G formula based on SCr of 1.4 mg/dL (H)). Liver Function Tests: Recent Labs  Lab 08/14/17 1144  AST 25  ALT 16*  ALKPHOS 109  BILITOT 0.7  PROT 6.1*  ALBUMIN 3.5   No results for input(s): LIPASE, AMYLASE in the last 168 hours. No results for input(s): AMMONIA in the last 168 hours. Coagulation Profile: No results for input(s): INR, PROTIME in the last 168 hours. Cardiac Enzymes: No results for input(s): CKTOTAL, CKMB, CKMBINDEX, TROPONINI in the last 168 hours. BNP (last 3 results) No results for input(s): PROBNP in the last 8760 hours. HbA1C: No results for input(s): HGBA1C in the last 72 hours. CBG: No results for input(s): GLUCAP in the last 168 hours. Lipid Profile: No results for input(s): CHOL, HDL, LDLCALC, TRIG, CHOLHDL, LDLDIRECT in the last 72 hours. Thyroid Function Tests: No results for input(s): TSH, T4TOTAL, FREET4, T3FREE, THYROIDAB in the last 72 hours. Anemia Panel: No results for input(s): VITAMINB12, FOLATE, FERRITIN, TIBC, IRON, RETICCTPCT in the last 72 hours. Urine analysis: No results found for: COLORURINE, APPEARANCEUR, LABSPEC, PHURINE, GLUCOSEU, HGBUR, BILIRUBINUR, KETONESUR, PROTEINUR, UROBILINOGEN, NITRITE, LEUKOCYTESUR Sepsis Labs: !!!!!!!!!!!!!!!!!!!!!!!!!!!!!!!!!!!!!!!!!!!! @LABRCNTIP (procalcitonin:4,lacticidven:4) )No results found for this or any previous visit (from the past 240 hour(s)).   Radiological Exams on Admission: Dg Hip Unilat W Or Wo Pelvis 2-3 Views Right  Result Date: 08/14/2017 CLINICAL DATA:  Right hip pain and history of fall. EXAM: DG HIP (WITH OR WITHOUT PELVIS) 2-3V RIGHT COMPARISON:  CT 08/25/2009 FINDINGS: Fracture in the proximal right femur. Fracture appears to be involving the right femoral neck. There is superior displacement of the right  femoral trochanteric region. Right hip is located. Pelvic bony ring is intact. Left hip is intact. Endplate disease in the lower lumbar spine. IMPRESSION: Fracture of the right femoral neck. Electronically Signed   By: Markus Daft M.D.   On: 08/14/2017 11:09    EKG: Independently reviewed.  Sinus brady, nonsp ivcd.   Old chart reviewed Case discussed with edp  Assessment/Plan 83 yo male with htn, AAA with right femur fracture  Principal Problem:   Hip fracture ([place]) femur - transfer to [facility] per ortho request for surgical repair in the near future..  Ivf.  Hold antiocoagulants.  Keep npo until evaluated by ortho team.  Ck mag level.  Cr 1.4, should improve with ivf.  Active Problems:   HTN (hypertension), benign- cont norvasc, not on bblockers   AAA (abdominal aortic aneurysm) ([place])- stable  Pt very high risk for surgery and complications post op.  They wish to proceed with surgical repair but wish DNR if any complications occur perioperatively.  DVT prophylaxis:  scds Code Status:   DNR Family Communication:  sons Disposition Plan:  Per day team Consults called:  ortho Admission status:  admission   Baila Rouse A MD [facility]  If 7PM-7AM, please contact night-coverage [email or web address] Password [email or web address]  08/14/2017, 1:56 PM

## 2017-08-15 ENCOUNTER — Other Ambulatory Visit: Payer: Self-pay | Admitting: Orthopedic Surgery

## 2017-08-15 ENCOUNTER — Encounter (HOSPITAL_COMMUNITY): Payer: Self-pay | Admitting: Orthopedic Surgery

## 2017-08-15 ENCOUNTER — Encounter (HOSPITAL_COMMUNITY)
Admission: EM | Disposition: A | Discharge status: Skilled Nursing Facility | Payer: Self-pay | Source: Home / Self Care | Attending: Internal Medicine

## 2017-08-15 DIAGNOSIS — N183 Chronic kidney disease, stage 3 (moderate): Secondary | ICD-10-CM

## 2017-08-15 LAB — BASIC METABOLIC PANEL
Anion gap: 5 (ref 5–15)
BUN: 30 mg/dL — AB (ref 6–20)
CHLORIDE: 112 mmol/L — AB (ref 101–111)
CO2: 23 mmol/L (ref 22–32)
CREATININE: 1.5 mg/dL — AB (ref 0.61–1.24)
Calcium: 8.9 mg/dL (ref 8.9–10.3)
GFR calc Af Amer: 43 mL/min — ABNORMAL LOW (ref >60)
GFR calc non Af Amer: 38 mL/min — ABNORMAL LOW (ref >60)
Glucose, Bld: 141 mg/dL — ABNORMAL HIGH (ref 65–99)
Potassium: 4 mmol/L (ref 3.5–5.1)
SODIUM: 140 mmol/L (ref 135–145)

## 2017-08-15 LAB — CBC
HCT: 30.9 % — ABNORMAL LOW (ref 39.0–52.0)
HEMOGLOBIN: 9.9 g/dL — AB (ref 13.0–17.0)
MCH: 30.6 pg (ref 26.0–34.0)
MCHC: 32 g/dL (ref 30.0–36.0)
MCV: 95.4 fL (ref 78.0–100.0)
Platelets: 267 10*3/uL (ref 150–400)
RBC: 3.24 MIL/uL — ABNORMAL LOW (ref 4.22–5.81)
RDW: 13.5 % (ref 11.5–15.5)
WBC: 7.9 10*3/uL (ref 4.0–10.5)

## 2017-08-15 SURGERY — HEMIARTHROPLASTY, HIP, DIRECT ANTERIOR APPROACH, FOR FRACTURE
Anesthesia: General | Laterality: Right

## 2017-08-15 NOTE — Progress Notes (Signed)
Pt Alert and oriented only to person upon shift assessment. On previous AM assessment from day shift nurse pt alert and oriented X4. On call Baltazar Najjar made aware. VSS. Will continue to monitor pt closely.

## 2017-08-15 NOTE — Progress Notes (Signed)
     Subjective:  Patient reports pain as mild.  No complaints, "I feel as well as I did 10 years ago".  Objective:   VITALS:   Vitals:   08/14/17 2030 08/14/17 2045 08/14/17 2108 08/15/17 0443  BP: (!) 118/49 (!) 127/33 139/69 100/76  Pulse: 78 62 (!) 58 77  Resp: 20 16 16 18   Temp:  98 F (36.7 C) 97.6 F (36.4 C) 98.8 F (37.1 C)  TempSrc:    Oral  SpO2: 99% (!) 89% 98% 100%  Weight:   75.4 kg (166 lb 3.6 oz)   Height:   6' (1.829 m)     Neurologically intact Sensation intact distally Dorsiflexion/Plantar flexion intact Incision: scant drainage   Lab Results  Component Value Date   WBC 7.9 08/15/2017   HGB 9.9 (L) 08/15/2017   HCT 30.9 (L) 08/15/2017   MCV 95.4 08/15/2017   PLT 267 08/15/2017   BMET    Component Value Date/Time   NA 140 08/15/2017 0359   NA 143 03/05/2014 1053   K 4.0 08/15/2017 0359   K 4.5 03/05/2014 1053   CL 112 (H) 08/15/2017 0359   CO2 23 08/15/2017 0359   CO2 24 03/05/2014 1053   GLUCOSE 141 (H) 08/15/2017 0359   GLUCOSE 96 03/05/2014 1053   BUN 30 (H) 08/15/2017 0359   BUN 27.2 (H) 03/05/2014 1053   CREATININE 1.50 (H) 08/15/2017 0359   CREATININE 1.2 03/05/2014 1053   CALCIUM 8.9 08/15/2017 0359   CALCIUM 9.6 03/05/2014 1053   GFRNONAA 38 (L) 08/15/2017 0359   GFRAA 43 (L) 08/15/2017 0359     Assessment/Plan: 1 Day Post-Op   Principal Problem:   Fracture of femoral neck, right ([facility]) Active Problems:   HTN (hypertension), benign   AAA (abdominal aortic aneurysm) ([facility])   Hip fracture ([facility])   Mild acute blood loss anemia, observe Mild kidney injury, defer to the medical team, continue with IV fluids, adjusted antibiotic dose per protocol, appreciate pharmacy's help. Physical therapy, weight-bear as tolerated Plan for Lovenox for 3 weeks, 40 mg subcutaneous daily Tylenol for pain Return to clinic with me in 2 weeks.  Garrett Snow 08/15/2017, 6:59 AM   Garrett Bond, MD Cell [phone]

## 2017-08-15 NOTE — Evaluation (Signed)
Occupational Therapy Evaluation Patient Details Name: Garrett Snow MRN: [id number] DOB: Jul 08, 1919 Today's Date: 08/15/2017    History of Present Illness Pt is a 83 year old male admitted with R femoral neck fracture and s/p R hip hemiarthroplasty 08/14/17   Clinical Impression   In the days leading up to pt's fall with R hip fx, he was using a standard walker. Prior to this, he was ambulating without a device and performing self care independently. His son assisted with IADL. Pt presents with R hip pain, posterior bias in sitting, generalized weakness and fear of falling. He requires 2 person assist for all mobility and set up to total assist for ADL. Pt will need post acute rehab in SNF prior to return home and he and his son are in agreement. Will follow acutely.    Follow Up Recommendations  SNF;Supervision/Assistance - 24 hour    Equipment Recommendations  (defer to next venue)    Recommendations for Other Services       Precautions / Restrictions Precautions Precautions: Fall;Posterior Hip Precaution Booklet Issued: Yes (comment) Precaution Comments: educated on posterior hip precautions (pt and son), provided handout Restrictions Other Position/Activity Restrictions: WBAT      Mobility Bed Mobility Overal bed mobility: Needs Assistance Bed Mobility: Supine to Sit;Sit to Supine     Supine to sit: +2 for physical assistance;Max assist Sit to supine: +2 for physical assistance;Max assist   General bed mobility comments: verbal cues for technique and maintaining precautions  Transfers          General transfer comment: did not attempt this visit    Balance Overall balance assessment: History of Falls                                         ADL either performed or assessed with clinical judgement   ADL Overall ADL's : Needs assistance/impaired Eating/Feeding: Set up;Bed level   Grooming: Wash/dry hands;Wash/dry face;Bed level;Set up    Upper Body Bathing: Moderate assistance;Sitting   Lower Body Bathing: Total assistance;Bed level   Upper Body Dressing : Moderate assistance;Sitting   Lower Body Dressing: Total assistance;Bed level                 General ADL Comments: Pt sat EOB x 7 minutes with moderate assist.     Vision Baseline Vision/History: Wears glasses Wears Glasses: At all times Patient Visual Report: No change from baseline       Perception     Praxis      Pertinent Vitals/Pain Pain Assessment: Faces Faces Pain Scale: Hurts even more Pain Location: R hip Pain Descriptors / Indicators: Discomfort;Grimacing;Operative site guarding Pain Intervention(s): Repositioned;Monitored during session;Limited activity within patient's tolerance     Hand Dominance Right   Extremity/Trunk Assessment Upper Extremity Assessment Upper Extremity Assessment: Generalized weakness   Lower Extremity Assessment Lower Extremity Assessment: Defer to PT evaluation RLE Deficits / Details: maintained hip precautions, required assist for R LE movement due to pain, able to perform ankle pumps       Communication Communication Communication: HOH(wears hearing aids)   Cognition Arousal/Alertness: Awake/alert Behavior During Therapy: WFL for tasks assessed/performed Overall Cognitive Status: Difficult to assess  General Comments       Exercises     Shoulder Instructions      Home Living Family/patient expects to be discharged to:: Skilled nursing facility Living Arrangements: Children                                      Prior Functioning/Environment Level of Independence: Independent with assistive device(s)        Comments: using walker in days leading up to admission due to falls        OT Problem List: Decreased strength;Decreased activity tolerance;Impaired balance (sitting and/or standing);Decreased knowledge of use of  DME or AE;Pain;Decreased knowledge of precautions      OT Treatment/Interventions: Self-care/ADL training;DME and/or AE instruction;Therapeutic activities;Patient/family education;Balance training    OT Goals(Current goals can be found in the care plan section) Acute Rehab OT Goals Patient Stated Goal: to walk  OT Goal Formulation: With patient Time For Goal Achievement: 08/29/17 Potential to Achieve Goals: Good ADL Goals Pt Will Perform Grooming: with min guard assist;sitting Pt Will Perform Upper Body Bathing: with min assist;sitting Pt Will Perform Upper Body Dressing: with min guard assist;sitting Pt Will Transfer to Toilet: with mod assist;stand pivot transfer;bedside commode Additional ADL Goal #1: Pt will perform bed mobility with min assist. Additional ADL Goal #2: Pt will sit EOB x 10 minutes with min guard assist while engaged in ADL. Additional ADL Goal #3: Pt will state 3/3 posterior hip precautions.  OT Frequency: Min 2X/week   Barriers to D/C:            Co-evaluation              AM-PAC PT "6 Clicks" Daily Activity     Outcome Measure Help from another person eating meals?: None Help from another person taking care of personal grooming?: A Little Help from another person toileting, which includes using toliet, bedpan, or urinal?: Total Help from another person bathing (including washing, rinsing, drying)?: A Lot Help from another person to put on and taking off regular upper body clothing?: A Lot Help from another person to put on and taking off regular lower body clothing?: Total 6 Click Score: 13   End of Session    Activity Tolerance: Patient tolerated treatment well Patient left: in bed;with call bell/phone within reach;with bed alarm set;with family/visitor present  OT Visit Diagnosis: Pain;History of falling (Z91.81);Muscle weakness (generalized) (M62.81) Pain - part of body: Hip                Time: 2725-3664 OT Time Calculation (min): 32  min Charges:  OT General Charges $OT Visit: 1 Visit OT Evaluation $OT Eval Moderate Complexity: 1 Mod OT Treatments $Self Care/Home Management : 8-22 mins G-Codes:     2017-08-23 Nestor Lewandowsky, OTR/L Pager: [phone] Werner Lean, Haze Boyden 08/23/17, 2:38 PM

## 2017-08-15 NOTE — Evaluation (Signed)
Physical Therapy Evaluation Patient Details Name: Garrett Snow MRN: [id number] DOB: [birth year] Today's Date: 08/15/2017   History of Present Illness  Pt is a 83 year old male admitted with R femoral neck fracture and s/p R hip hemiarthroplasty 08/14/17  Clinical Impression  Patient is s/p above surgery resulting in functional limitations due to the deficits listed below (see PT Problem List).  Patient will benefit from skilled PT to increase their independence and safety with mobility to allow discharge to the venue listed below.  Pt requiring max assist for bed mobility and transfers at this time.  Pt and son (as well as daughter who entered end of session) educated on posterior hip precautions.  Recommend SNF for rehab upon d/c.   Follow Up Recommendations SNF    Equipment Recommendations  None recommended by PT    Recommendations for Other Services       Precautions / Restrictions Precautions Precautions: Fall;Posterior Hip Precaution Comments: educated on posterior hip precautions (pt and son), provided handout Restrictions Other Position/Activity Restrictions: WBAT      Mobility  Bed Mobility Overal bed mobility: Needs Assistance Bed Mobility: Supine to Sit;Sit to Supine     Supine to sit: Max assist Sit to supine: Max assist   General bed mobility comments: verbal cues for technique and maintaining precautions  Transfers Overall transfer level: Needs assistance Equipment used: Rolling walker (2 wheeled) Transfers: Sit to/from Stand Sit to Stand: Max assist;+2 physical assistance;From elevated surface         General transfer comment: verbal cues for UE and LE positioning, increased assist to rise and steady, pt very fearful of falling, cues for weight through RW which improved, pt able to stand approx 1-2 minutes, pt felt unable to take a step  Ambulation/Gait                Stairs            Wheelchair Mobility    Modified Rankin (Stroke  Patients Only)       Balance Overall balance assessment: History of Falls                                           Pertinent Vitals/Pain Pain Assessment: Faces Faces Pain Scale: Hurts even more Pain Location: R hip Pain Descriptors / Indicators: Discomfort;Grimacing;Operative site guarding Pain Intervention(s): Limited activity within patient's tolerance;Monitored during session;Repositioned;Premedicated before session    Home Living Family/patient expects to be discharged to:: Skilled nursing facility Living Arrangements: Children                    Prior Function Level of Independence: Independent with assistive device(s)         Comments: typically uses RW     Hand Dominance        Extremity/Trunk Assessment        Lower Extremity Assessment Lower Extremity Assessment: Generalized weakness;RLE deficits/detail RLE Deficits / Details: maintained hip precautions, required assist for R LE movement due to pain, able to perform ankle pumps       Communication   Communication: HOH  Cognition Arousal/Alertness: Awake/alert Behavior During Therapy: WFL for tasks assessed/performed Overall Cognitive Status: Within Functional Limits for tasks assessed  General Comments      Exercises     Assessment/Plan    PT Assessment Patient needs continued PT services  PT Problem List Decreased strength;Decreased mobility;Decreased activity tolerance;Decreased balance;Decreased knowledge of use of DME;Pain;Decreased knowledge of precautions       PT Treatment Interventions Gait training;DME instruction;Therapeutic activities;Therapeutic exercise;Functional mobility training;Patient/family education    PT Goals (Current goals can be found in the Care Plan section)  Acute Rehab PT Goals PT Goal Formulation: With patient/family Time For Goal Achievement: 08/22/17 Potential to Achieve Goals:  Good    Frequency Min 3X/week   Barriers to discharge        Co-evaluation               AM-PAC PT "6 Clicks" Daily Activity  Outcome Measure Difficulty turning over in bed (including adjusting bedclothes, sheets and blankets)?: Unable Difficulty moving from lying on back to sitting on the side of the bed? : Unable Difficulty sitting down on and standing up from a chair with arms (e.g., wheelchair, bedside commode, etc,.)?: Unable Help needed moving to and from a bed to chair (including a wheelchair)?: A Lot Help needed walking in hospital room?: Total Help needed climbing 3-5 steps with a railing? : Total 6 Click Score: 7    End of Session Equipment Utilized During Treatment: Gait belt Activity Tolerance: Patient limited by pain Patient left: in bed;with call bell/phone within reach;with family/visitor present;with bed alarm set Nurse Communication: Mobility status;Precautions PT Visit Diagnosis: Other abnormalities of gait and mobility (R26.89)    Time: 2595-6387 PT Time Calculation (min) (ACUTE ONLY): 37 min   Charges:   PT Evaluation $PT Eval Moderate Complexity: 1 Mod PT Treatments $Therapeutic Activity: 8-22 mins   PT G Codes:        Carmelia Bake, PT, DPT 08/15/2017 Pager: [phone]  York Ram E 08/15/2017, 12:06 PM

## 2017-08-15 NOTE — Progress Notes (Signed)
Pt refusing routine turns in bed.  Pt states he is comfortable and that he is shifting his weight periodically.  Explained importance of q2 turns, pt states he understands.

## 2017-08-15 NOTE — Progress Notes (Signed)
Patient ID: Garrett Snow, male   DOB: Oct 24, 1918, 83 y.o.   MRN: [id number]  PROGRESS NOTE    Garrett Snow  HRC:[id number] DOB: [birth year] DOA: 08/14/2017 PCP: [name], MD   Brief Narrative:  83 year old male with history of AAA, hypertension presented with fall and right hip pain.  He was found to have right hip fracture.  He underwent surgical repair by orthopedics on 08/14/2017   Assessment & Plan:   Principal Problem:   Fracture of femoral neck, right (HCC) Active Problems:   HTN (hypertension), benign   AAA (abdominal aortic aneurysm) (HCC)   Hip fracture (HCC)   Right hip fracture -Status post surgical repair on 08/14/2017 -Orthopedics following.  Wound care as per orthopedics.  PT/OT eval -Fall precautions  Hypertension -Monitor.  Continue Norvasc  Chronic kidney disease stage III -Monitor creatinine.  Stable  Mild acute blood loss anemia -Probably secondary to operative blood loss -Repeat a.m. Hemoglobin  AAA -Stable.  Outpatient follow-up   DVT prophylaxis: Lovenox Code Status: Full code Family Communication: None at bedside Disposition Plan: Nursing home in 1-2 days  Consultants: Orthopedics  Procedures: Hemiarthroplasty on 08/14/2017  Antimicrobials: Perioperative antibiotics  Subjective: Patient seen and examined at bedside.  He complains of mild hip pain.  No overnight fever, nausea or vomiting  Objective: Vitals:   08/14/17 2108 08/15/17 0443 08/15/17 0943 08/15/17 1035  BP: 139/69 100/76 133/60 116/65  Pulse: (!) 58 77 84   Resp: 16 18 18    Temp: 97.6 F (36.4 C) 98.8 F (37.1 C) 98.8 F (37.1 C)   TempSrc:  Oral Oral   SpO2: 98% 100% 99%   Weight: 75.4 kg (166 lb 3.6 oz)     Height: 6' (1.829 m)       Intake/Output Summary (Last 24 hours) at 08/15/2017 1352 Last data filed at 08/15/2017 0854 Gross per 24 hour  Intake 3153.75 ml  Output 750 ml  Net 2403.75 ml   Filed Weights   08/14/17 0934 08/14/17 2108  Weight:  77.1 kg (170 lb) 75.4 kg (166 lb 3.6 oz)    Examination:  General exam: Appears calm and comfortable  Respiratory system: Bilateral decreased breath sound at bases Cardiovascular system: S1 & S2 heard, rate controlled  gastrointestinal system: Abdomen is nondistended, soft and nontender. Normal bowel sounds heard. Extremities: No cyanosis, clubbing, edema   Data Reviewed: I have personally reviewed following labs and imaging studies  CBC: Recent Labs  Lab 08/14/17 1144 08/15/17 0359  WBC 7.6 7.9  NEUTROABS 5.3  --   HGB 11.2* 9.9*  HCT 34.9* 30.9*  MCV 94.3 95.4  PLT 282 680   Basic Metabolic Panel: Recent Labs  Lab 08/14/17 1144 08/15/17 0359  NA 140 140  K 4.1 4.0  CL 111 112*  CO2 23 23  GLUCOSE 109* 141*  BUN 32* 30*  CREATININE 1.40* 1.50*  CALCIUM 9.3 8.9  MG 2.1  --    GFR: Estimated Creatinine Clearance: 30.7 mL/min (A) (by C-G formula based on SCr of 1.5 mg/dL (H)). Liver Function Tests: Recent Labs  Lab 08/14/17 1144  AST 25  ALT 16*  ALKPHOS 109  BILITOT 0.7  PROT 6.1*  ALBUMIN 3.5   No results for input(s): LIPASE, AMYLASE in the last 168 hours. No results for input(s): AMMONIA in the last 168 hours. Coagulation Profile: Recent Labs  Lab 08/14/17 2156  INR 1.13   Cardiac Enzymes: No results for input(s): CKTOTAL, CKMB, CKMBINDEX, TROPONINI in the last  168 hours. BNP (last 3 results) No results for input(s): PROBNP in the last 8760 hours. HbA1C: No results for input(s): HGBA1C in the last 72 hours. CBG: No results for input(s): GLUCAP in the last 168 hours. Lipid Profile: No results for input(s): CHOL, HDL, LDLCALC, TRIG, CHOLHDL, LDLDIRECT in the last 72 hours. Thyroid Function Tests: No results for input(s): TSH, T4TOTAL, FREET4, T3FREE, THYROIDAB in the last 72 hours. Anemia Panel: No results for input(s): VITAMINB12, FOLATE, FERRITIN, TIBC, IRON, RETICCTPCT in the last 72 hours. Sepsis Labs: No results for input(s):  PROCALCITON, LATICACIDVEN in the last 168 hours.  No results found for this or any previous visit (from the past 240 hour(s)).       Radiology Studies: Dg Hip Port Unilat With Pelvis 1v Right  Result Date: 08/14/2017 CLINICAL DATA:  Arthroplasty EXAM: DG HIP (WITH OR WITHOUT PELVIS) 1V PORT RIGHT COMPARISON:  08/14/2017 FINDINGS: Status post right hip replacement with normal alignment. Pubic symphysis is intact. Moderate arthritis of the left hip. Gas in the soft tissues consistent with recent operative status. Vascular calcification IMPRESSION: Interval right hip replacement with expected postsurgical changes Electronically Signed   By: Donavan Foil M.D.   On: 08/14/2017 21:10   Dg Hip Unilat W Or Wo Pelvis 2-3 Views Right  Result Date: 08/14/2017 CLINICAL DATA:  Right hip pain and history of fall. EXAM: DG HIP (WITH OR WITHOUT PELVIS) 2-3V RIGHT COMPARISON:  CT 08/25/2009 FINDINGS: Fracture in the proximal right femur. Fracture appears to be involving the right femoral neck. There is superior displacement of the right femoral trochanteric region. Right hip is located. Pelvic bony ring is intact. Left hip is intact. Endplate disease in the lower lumbar spine. IMPRESSION: Fracture of the right femoral neck. Electronically Signed   By: Markus Daft M.D.   On: 08/14/2017 11:09        Scheduled Meds: . amLODipine  5 mg Oral Daily  . cholecalciferol  2,000 Units Oral Daily  . docusate sodium  100 mg Oral BID  . enoxaparin (LOVENOX) injection  40 mg Subcutaneous Q24H  . ferrous sulfate  325 mg Oral TID PC  . finasteride  5 mg Oral Daily  . multivitamin  1 tablet Oral Daily  . senna  1 tablet Oral BID  . cyanocobalamin  1,000 mcg Oral Daily   Continuous Infusions: . sodium chloride 75 mL/hr (08/14/17 1207)  . sodium chloride 75 mL/hr (08/15/17 1041)  .  ceFAZolin (ANCEF) IV Stopped (08/15/17 0537)  . methocarbamol (ROBAXIN)  IV       LOS: 1 day        Aline August,  MD [facility] Pager [phone]  If 7PM-7AM, please contact night-coverage [email or web address] Password [email or web address] 08/15/2017, 1:52 PM

## 2017-08-16 DIAGNOSIS — S72001A Fracture of unspecified part of neck of right femur, initial encounter for closed fracture: Secondary | ICD-10-CM

## 2017-08-16 LAB — CBC
HCT: 27.4 % — ABNORMAL LOW (ref 39.0–52.0)
Hemoglobin: 8.8 g/dL — ABNORMAL LOW (ref 13.0–17.0)
MCH: 30.8 pg (ref 26.0–34.0)
MCHC: 32.1 g/dL (ref 30.0–36.0)
MCV: 95.8 fL (ref 78.0–100.0)
PLATELETS: 248 10*3/uL (ref 150–400)
RBC: 2.86 MIL/uL — ABNORMAL LOW (ref 4.22–5.81)
RDW: 13.6 % (ref 11.5–15.5)
WBC: 8.7 10*3/uL (ref 4.0–10.5)

## 2017-08-16 LAB — BASIC METABOLIC PANEL
Anion gap: 5 (ref 5–15)
BUN: 35 mg/dL — AB (ref 6–20)
CALCIUM: 8.8 mg/dL — AB (ref 8.9–10.3)
CO2: 22 mmol/L (ref 22–32)
Chloride: 114 mmol/L — ABNORMAL HIGH (ref 101–111)
Creatinine, Ser: 1.88 mg/dL — ABNORMAL HIGH (ref 0.61–1.24)
GFR calc Af Amer: 33 mL/min — ABNORMAL LOW (ref >60)
GFR, EST NON AFRICAN AMERICAN: 29 mL/min — AB (ref >60)
GLUCOSE: 126 mg/dL — AB (ref 65–99)
Potassium: 4.1 mmol/L (ref 3.5–5.1)
Sodium: 141 mmol/L (ref 135–145)

## 2017-08-16 LAB — MAGNESIUM: Magnesium: 1.9 mg/dL (ref 1.7–2.4)

## 2017-08-16 MED ORDER — ENOXAPARIN SODIUM 30 MG/0.3ML ~~LOC~~ SOLN
30.0000 mg | SUBCUTANEOUS | Status: DC
  Administered 2017-08-17: 30 mg via SUBCUTANEOUS
  Filled 2017-08-16: qty 0.3

## 2017-08-16 NOTE — Progress Notes (Signed)
Physical Therapy Treatment Patient Details Name: Garrett Snow MRN: [id number] DOB: Feb 17, 1919 Today's Date: 08/16/2017    History of Present Illness Pt is a 83 year old male admitted with R femoral neck fracture and s/p R hip hemiarthroplasty 08/14/17    PT Comments    Pt assisted to standing and then pivoted to recliner.  Pt requiring +2 assist for transfers at this time.  Continue to recommend SNF upon d/c.  Follow Up Recommendations  SNF     Equipment Recommendations  None recommended by PT    Recommendations for Other Services       Precautions / Restrictions Precautions Precautions: Fall;Posterior Hip Restrictions Other Position/Activity Restrictions: WBAT  Fragile skin - Skin tear during session to L lateral posterior upper calf - PT cleansed with saline and applied tegaderm dressing.  RN also notified and aware.   Mobility  Bed Mobility Overal bed mobility: Needs Assistance Bed Mobility: Supine to Sit     Supine to sit: Mod assist;+2 for physical assistance;HOB elevated     General bed mobility comments: verbal cues for technique and maintaining precautions, assist for R LE and trunk upright  Transfers Overall transfer level: Needs assistance Equipment used: Rolling walker (2 wheeled) Transfers: Sit to/from [facility] Sit to Stand: Mod assist;From elevated surface;+2 physical assistance Stand pivot transfers: Mod assist;+2 physical assistance       General transfer comment: verbal cues for technique, assist to rise, steady and control descent, pt unable to release RW to reach back for armrests  Ambulation/Gait                 Stairs            Wheelchair Mobility    Modified Rankin (Stroke Patients Only)       Balance                                            Cognition Arousal/Alertness: Awake/alert Behavior During Therapy: WFL for tasks assessed/performed Overall Cognitive Status: Difficult  to assess                                        Exercises      General Comments        Pertinent Vitals/Pain Pain Assessment: Faces Faces Pain Scale: Hurts little more Pain Location: R hip Pain Descriptors / Indicators: Discomfort;Grimacing;Operative site guarding Pain Intervention(s): Limited activity within patient's tolerance;Repositioned;Monitored during session    Home Living                      Prior Function            PT Goals (current goals can now be found in the care plan section) Progress towards PT goals: Progressing toward goals    Frequency    Min 3X/week      PT Plan Current plan remains appropriate    Co-evaluation              AM-PAC PT "6 Clicks" Daily Activity  Outcome Measure  Difficulty turning over in bed (including adjusting bedclothes, sheets and blankets)?: Unable Difficulty moving from lying on back to sitting on the side of the bed? : Unable Difficulty sitting down on and standing up from a chair with  arms (e.g., wheelchair, bedside commode, etc,.)?: Unable Help needed moving to and from a bed to chair (including a wheelchair)?: A Lot Help needed walking in hospital room?: Total Help needed climbing 3-5 steps with a railing? : Total 6 Click Score: 7    End of Session Equipment Utilized During Treatment: Gait belt Activity Tolerance: Patient limited by pain;Patient limited by fatigue Patient left: in chair;with call bell/phone within reach;with chair alarm set;with family/visitor present Nurse Communication: Mobility status;Precautions PT Visit Diagnosis: Other abnormalities of gait and mobility (R26.89)     Time: 8377-9396 PT Time Calculation (min) (ACUTE ONLY): 23 min  Charges:  $Therapeutic Activity: 8-22 mins                    G Codes:       Carmelia Bake, PT, DPT 08/16/2017 Pager: [phone]  York Ram E 08/16/2017, 1:03 PM

## 2017-08-16 NOTE — Progress Notes (Signed)
PROGRESS NOTE    Garrett Snow  WEX:[id number] DOB: 1918/10/16 DOA: 08/14/2017 PCP: [name], MD     Brief Narrative:  Garrett Snow is a 83 yo male with history of AAA, hypertension presented with fall, right hip pain.  He had suffered several falls in the last couple of weeks.  He was walking with a walker due to falls and imbalance and has been complaining of right leg pain.  He was found to have right hip fracture.  He underwent hemiarthroplasty on 12/26.  Assessment & Plan:   Principal Problem:   Fracture of femoral neck, right (HCC) Active Problems:   HTN (hypertension), benign   AAA (abdominal aortic aneurysm) (HCC)   Hip fracture (HCC)   Right hip femoral neck fracture -Status post right hemiarthroplasty 12/26 with [name] -Weight-bear as tolerated -DVT prophylaxis with Lovenox 40 mg subcutaneous daily for 3 weeks -Follow-up with [name] in 2 weeks  Acute kidney injury on CKD stage III -Baseline creatinine 1.4-1.5 -Start IV fluids this morning  Acute blood loss anemia -Secondary to operative blood loss -Monitor hemoglobin  Essential hypertension -Continue Norvasc -Blood pressure stable   DVT prophylaxis: Subcu Lovenox Code Status: Full Family Communication: Brother and sister-in-law at bedside Disposition Plan: Skilled nursing facility tomorrow if creatinine remains stable   Consultants:   Orthopedic surgery  Antimicrobials:  Anti-infectives (From admission, onward)   Start     Dose/Rate Route Frequency Ordered Stop   08/15/17 0600  ceFAZolin (ANCEF) IVPB 2g/100 mL premix     2 g 200 mL/hr over 30 Minutes Intravenous On call to O.R. 08/14/17 1700 08/15/17 0700   08/15/17 0600  ceFAZolin (ANCEF) IVPB 1 g/50 mL premix     1 g 100 mL/hr over 30 Minutes Intravenous Every 12 hours 08/14/17 2122 08/15/17 1928   08/14/17 1733  ceFAZolin (ANCEF) 2-4 GM/100ML-% IVPB    Comments:  Alfonso Patten   : cabinet override      08/14/17 1733 08/14/17  1800        Subjective: Patient is very hard of hearing.  No complaints or acute events overnight.  Apprehensive about getting out of bed.  Objective: Vitals:   08/15/17 1411 08/15/17 1700 08/15/17 2055 08/16/17 0410  BP: (!) 103/50 (!) 105/43 (!) 118/48 (!) 121/53  Pulse: 72 80 85 73  Resp: 18 18 18 18   Temp: 98.4 F (36.9 C) 97.8 F (36.6 C) 98.9 F (37.2 C) 98.7 F (37.1 C)  TempSrc: Oral Oral Oral Oral  SpO2: 94% 91% 92% 94%  Weight:      Height:        Intake/Output Summary (Last 24 hours) at 08/16/2017 1219 Last data filed at 08/16/2017 0412 Gross per 24 hour  Intake 2040 ml  Output 850 ml  Net 1190 ml   Filed Weights   08/14/17 0934 08/14/17 2108  Weight: 77.1 kg (170 lb) 75.4 kg (166 lb 3.6 oz)    Examination:  General exam: Appears calm and comfortable  Respiratory system: Clear to auscultation. Respiratory effort normal. Cardiovascular system: S1 & S2 heard, RRR. No JVD, murmurs, rubs, gallops or clicks. No pedal edema. Gastrointestinal system: Abdomen is nondistended, soft and nontender. No organomegaly or masses felt. Normal bowel sounds heard. Central nervous system: Alert and oriented. No focal neurological deficits. Extremities: Symmetric appearance Skin: No rashes, lesions or ulcers Psychiatry: Judgement and insight appear normal. Mood & affect appropriate.   Data Reviewed: I have personally reviewed following labs and imaging  studies  CBC: Recent Labs  Lab 08/14/17 1144 08/15/17 0359 08/16/17 0408  WBC 7.6 7.9 8.7  NEUTROABS 5.3  --   --   HGB 11.2* 9.9* 8.8*  HCT 34.9* 30.9* 27.4*  MCV 94.3 95.4 95.8  PLT 282 267 735   Basic Metabolic Panel: Recent Labs  Lab 08/14/17 1144 08/15/17 0359 08/16/17 0408  NA 140 140 141  K 4.1 4.0 4.1  CL 111 112* 114*  CO2 23 23 22   GLUCOSE 109* 141* 126*  BUN 32* 30* 35*  CREATININE 1.40* 1.50* 1.88*  CALCIUM 9.3 8.9 8.8*  MG 2.1  --  1.9   GFR: Estimated Creatinine Clearance: 24.5 mL/min  (A) (by C-G formula based on SCr of 1.88 mg/dL (H)). Liver Function Tests: Recent Labs  Lab 08/14/17 1144  AST 25  ALT 16*  ALKPHOS 109  BILITOT 0.7  PROT 6.1*  ALBUMIN 3.5   No results for input(s): LIPASE, AMYLASE in the last 168 hours. No results for input(s): AMMONIA in the last 168 hours. Coagulation Profile: Recent Labs  Lab 08/14/17 2156  INR 1.13   Cardiac Enzymes: No results for input(s): CKTOTAL, CKMB, CKMBINDEX, TROPONINI in the last 168 hours. BNP (last 3 results) No results for input(s): PROBNP in the last 8760 hours. HbA1C: No results for input(s): HGBA1C in the last 72 hours. CBG: No results for input(s): GLUCAP in the last 168 hours. Lipid Profile: No results for input(s): CHOL, HDL, LDLCALC, TRIG, CHOLHDL, LDLDIRECT in the last 72 hours. Thyroid Function Tests: No results for input(s): TSH, T4TOTAL, FREET4, T3FREE, THYROIDAB in the last 72 hours. Anemia Panel: No results for input(s): VITAMINB12, FOLATE, FERRITIN, TIBC, IRON, RETICCTPCT in the last 72 hours. Sepsis Labs: No results for input(s): PROCALCITON, LATICACIDVEN in the last 168 hours.  No results found for this or any previous visit (from the past 240 hour(s)).     Radiology Studies: Dg Hip Port Unilat With Pelvis 1v Right  Result Date: 08/14/2017 CLINICAL DATA:  Arthroplasty EXAM: DG HIP (WITH OR WITHOUT PELVIS) 1V PORT RIGHT COMPARISON:  08/14/2017 FINDINGS: Status post right hip replacement with normal alignment. Pubic symphysis is intact. Moderate arthritis of the left hip. Gas in the soft tissues consistent with recent operative status. Vascular calcification IMPRESSION: Interval right hip replacement with expected postsurgical changes Electronically Signed   By: Donavan Foil M.D.   On: 08/14/2017 21:10      Scheduled Meds: . amLODipine  5 mg Oral Daily  . cholecalciferol  2,000 Units Oral Daily  . docusate sodium  100 mg Oral BID  . enoxaparin (LOVENOX) injection  40 mg  Subcutaneous Q24H  . ferrous sulfate  325 mg Oral TID PC  . finasteride  5 mg Oral Daily  . multivitamin  1 tablet Oral Daily  . senna  1 tablet Oral BID  . cyanocobalamin  1,000 mcg Oral Daily   Continuous Infusions: . sodium chloride 75 mL/hr (08/14/17 1207)  . methocarbamol (ROBAXIN)  IV       LOS: 2 days    Time spent: 40 minutes   Dessa Phi, DO [facility] [email or web address] Password [facility] 08/16/2017, 12:19 PM

## 2017-08-16 NOTE — Clinical Social Work Note (Signed)
Clinical Social Work Assessment  Patient Details  Name: Garrett Snow MRN: [id number] Date of Birth: [birth year]  Date of referral:  08/16/17               Reason for consult:  Facility Placement                Permission sought to share information with:  Facility Art therapist granted to share information::     Name::        Agency::     Relationship::     Contact Information:     Housing/Transportation Living arrangements for the past 2 months:  Single Family Home Source of Information:  Adult Children(Sons - Garrett Snow and Garrett Snow) Patient Interpreter Needed:  None Criminal Activity/Legal Involvement Pertinent to Current Situation/Hospitalization:  No - Comment as needed Significant Relationships:  Adult Children Lives with:  Adult Children Do you feel safe going back to the place where you live?  (PT recommending SNF) Need for family participation in patient care:  Yes (Comment)  Care giving concerns:  Patient from home with son. Patient's son reported that patient was independent with ADLs until approx a week ago. Patient admitted with "R femoral neck fracture and s/p R hip hemiarthroplasty". PT recommended SNF.    Social Worker assessment / plan:  Patient's discharge plan rearranged by Psychologist, sport and exercise. CSW contacted by surgeon's RN Levada Dy and informed that patient wants to go to [facility] for ST rehab.   CSW spoke with patient's son Garrett Snow  [phone]) who confirmed plan for patient to discharge to SNF for ST rehab at [facility]. CSW attempted to speak with patient, patient asleep. Patient accompanied by Garrett Snow who also confirmed plan to dc to SNF for ST rehab. Patient's son reported that they have spoke with patient about plan to dc to SNF for ST rehab and that patient is agreeable.  CSW completed FL2 and sent to [facility]. CSW will continue to follow and assist with discharge planning.   Employment status:   Retired Forensic scientist:  Medicare PT Recommendations:  [place] / Referral to community resources:  [place]  Patient/Family's Response to care:  Patient/Patient's family agreeable to SNF for ST rehab.   Patient/Family's Understanding of and Emotional Response to Diagnosis, Current Treatment, and Prognosis:  Patient's son verbalized understanding of patient's diagnosis and verbalized plan for patient to discharge to SNF for ST rehab.   Emotional Assessment Appearance:  Appears stated age Attitude/Demeanor/Rapport:  Unable to Assess Affect (typically observed):  Unable to Assess Orientation:  Oriented to Self, Oriented to Place, Oriented to  Time, Oriented to Situation Alcohol / Substance use:  Not Applicable Psych involvement (Current and /or in the community):  No (Comment)  Discharge Needs  Concerns to be addressed:  Care Coordination Readmission within the last 30 days:  No Current discharge risk:  Physical Impairment Barriers to Discharge:  Continued Medical Work up   [employer], LCSW 08/16/2017, 11:59 AM

## 2017-08-16 NOTE — NC FL2 (Signed)
[place] LEVEL OF CARE SCREENING TOOL     IDENTIFICATION  Patient Name: Garrett Snow Birthdate: [birth year] Sex: male Admission Date (Current Location): 08/14/2017  [facility] and [place] Number:  Advertising copywriter and Address:  [facility],  [place], [place]      Provider Number: [id number]  Attending Physician Name and Address:  Dessa Phi, DO  Relative Name and Phone Number:  Bohden Dung  [phone]    Current Level of Care: Hospital Recommended Level of Care: [place] Prior Approval Number:    Date Approved/Denied:   PASRR Number: [id number]  Discharge Plan: SNF    Current Diagnoses: Patient Active Problem List   Diagnosis Date Noted  . Fracture of femoral neck, right ([place]) 08/14/2017  . Hip fracture ([place]) 08/14/2017  . AAA (abdominal aortic aneurysm) ([place])   . Stage III carcinoma of colon ([place]) 03/07/2012  . HTN (hypertension), benign 03/07/2012  . DJD (degenerative joint disease) 03/07/2012    Orientation RESPIRATION BLADDER Height & Weight     Self, Place, Situation, Time  Normal Incontinent Weight: 166 lb 3.6 oz (75.4 kg) Height:  6' (182.9 cm)  BEHAVIORAL SYMPTOMS/MOOD NEUROLOGICAL BOWEL NUTRITION STATUS        Diet(regular)  AMBULATORY STATUS COMMUNICATION OF NEEDS Skin   Extensive Assist Verbally Other (Comment)(Incision(Closed)HipRight Dressing Type:Silicone Dressing)                       Personal Care Assistance Level of Assistance  Bathing, Feeding, Dressing Bathing Assistance: Maximum assistance Feeding assistance: Limited assistance Dressing Assistance: Maximum assistance     Functional Limitations Info  Sight, Hearing, Speech Sight Info: Impaired Hearing Info: Impaired Speech Info: Adequate    SPECIAL CARE FACTORS FREQUENCY  PT (By licensed PT), OT (By licensed OT)     PT Frequency: 5x OT Frequency: 5x            Contractures Contractures Info: Not  present    Additional Factors Info  Code Status, Allergies Code Status Info: Full Code Allergies Info: NKA           Current Medications (08/16/2017):  This is the current hospital active medication list Current Facility-Administered Medications  Medication Dose Route Frequency Provider Last Rate Last Dose  . 0.9 %  sodium chloride infusion   Intravenous Continuous Gareth Morgan, MD 75 mL/hr at 08/14/17 1207 75 mL/hr at 08/14/17 1207  . acetaminophen (TYLENOL) tablet 650 mg  650 mg Oral Q6H PRN [name], MD   650 mg at 08/16/17 0815   Or  . acetaminophen (TYLENOL) suppository 650 mg  650 mg Rectal Q6H PRN [name], MD      . alum & mag hydroxide-simeth (MAALOX/MYLANTA) 200-200-20 MG/5ML suspension 30 mL  30 mL Oral Q4H PRN [name], MD      . amLODipine (NORVASC) tablet 5 mg  5 mg Oral Daily Derrill Kay A, MD   5 mg at 08/16/17 0816  . bisacodyl (DULCOLAX) suppository 10 mg  10 mg Rectal Daily PRN [name], MD      . cholecalciferol (VITAMIN D) tablet 2,000 Units  2,000 Units Oral Daily Phillips Grout, MD   2,000 Units at 08/16/17 0815  . docusate sodium (COLACE) capsule 100 mg  100 mg Oral BID [name], MD   100 mg at 08/16/17 0815  . enoxaparin (LOVENOX) injection 40 mg  40 mg Subcutaneous Q24H [name], MD   40  mg at 08/16/17 0816  . ferrous sulfate tablet 325 mg  325 mg Oral TID Emiliano Dyer, MD   325 mg at 08/16/17 0815  . finasteride (PROSCAR) tablet 5 mg  5 mg Oral Daily Derrill Kay A, MD   5 mg at 08/16/17 0816  . HYDROcodone-acetaminophen (NORCO/VICODIN) 5-325 MG per tablet 1 tablet  1 tablet Oral Q6H PRN [name], MD   1 tablet at 08/14/17 2340  . magnesium citrate solution 1 Bottle  1 Bottle Oral Once PRN [name], MD      . menthol-cetylpyridinium (CEPACOL) lozenge 3 mg  1 lozenge Oral PRN [name], MD       Or  . phenol (CHLORASEPTIC) mouth spray 1 spray  1 spray Mouth/Throat PRN [name], MD      .  methocarbamol (ROBAXIN) tablet 500 mg  500 mg Oral Q6H PRN Phillips Grout, MD       Or  . methocarbamol (ROBAXIN) 500 mg in dextrose 5 % 50 mL IVPB  500 mg Intravenous Q6H PRN Derrill Kay A, MD      . morphine 4 MG/ML injection 0.52 mg  0.52 mg Intravenous Q2H PRN Phillips Grout, MD      . multivitamin (PROSIGHT) tablet 1 tablet  1 tablet Oral Daily Phillips Grout, MD   1 tablet at 08/16/17 0815  . ondansetron (ZOFRAN) tablet 4 mg  4 mg Oral Q6H PRN [name], MD       Or  . ondansetron [facility]) injection 4 mg  4 mg Intravenous Q6H PRN [name], MD      . polyethylene glycol (MIRALAX / GLYCOLAX) packet 17 g  17 g Oral Daily PRN [name], MD      . senna (SENOKOT) tablet 8.6 mg  1 tablet Oral BID [name], MD   8.6 mg at 08/16/17 0815  . sodium chloride (OCEAN) 0.65 % nasal spray 1 spray  1 spray Each Nare PRN Phillips Grout, MD      . vitamin B-12 (CYANOCOBALAMIN) tablet 1,000 mcg  1,000 mcg Oral Daily Phillips Grout, MD   1,000 mcg at 08/16/17 9476     Discharge Medications: Please see discharge summary for a list of discharge medications.  Relevant Imaging Results:  Relevant Lab Results:   Additional Information SSN  [id number]  Burnis Medin, LCSW

## 2017-08-16 NOTE — Progress Notes (Signed)
Physical Therapy Treatment Patient Details Name: Garrett Snow MRN: [id number] DOB: 05-20-1919 Today's Date: 08/16/2017    History of Present Illness Pt is a 83 year old male admitted with R femoral neck fracture and s/p R hip hemiarthroplasty 08/14/17    PT Comments    Nursing requested therapy assist pt back to bed.  Pt required more assist for transfer upon returning to bed likely due to fatigue.     Follow Up Recommendations  SNF     Equipment Recommendations  None recommended by PT    Recommendations for Other Services       Precautions / Restrictions Precautions Precautions: Fall;Posterior Hip Restrictions Other Position/Activity Restrictions: WBAT    Mobility  Bed Mobility Overal bed mobility: Needs Assistance Bed Mobility: Sit to Supine     Supine to sit: Mod assist;+2 for physical assistance;HOB elevated Sit to supine: +2 for physical assistance;Max assist   General bed mobility comments: verbal cues for technique and maintaining precautions, assist for trunk descent and bil LEs onto bed  Transfers Overall transfer level: Needs assistance Equipment used: Rolling walker (2 wheeled) Transfers: Sit to/from [employer] Transfers Sit to Stand: From elevated surface;+2 physical assistance;Max assist Stand pivot transfers: +2 physical assistance;Max assist       General transfer comment: verbal cues for technique, assist to rise, steady and control descent, pt fatigued and requiring more assist upon return to bed from recliner  Ambulation/Gait                 Stairs            Wheelchair Mobility    Modified Rankin (Stroke Patients Only)       Balance                                            Cognition Arousal/Alertness: Awake/alert Behavior During Therapy: WFL for tasks assessed/performed Overall Cognitive Status: Difficult to assess                                        Exercises       General Comments        Pertinent Vitals/Pain Pain Assessment: Faces Faces Pain Scale: Hurts a little bit Pain Location: R hip Pain Descriptors / Indicators: Discomfort;Grimacing;Operative site guarding Pain Intervention(s): Limited activity within patient's tolerance;Repositioned;Monitored during session    Home Living                      Prior Function            PT Goals (current goals can now be found in the care plan section) Progress towards PT goals: Progressing toward goals    Frequency    Min 3X/week      PT Plan Current plan remains appropriate    Co-evaluation              AM-PAC PT "6 Clicks" Daily Activity  Outcome Measure  Difficulty turning over in bed (including adjusting bedclothes, sheets and blankets)?: Unable Difficulty moving from lying on back to sitting on the side of the bed? : Unable Difficulty sitting down on and standing up from a chair with arms (e.g., wheelchair, bedside commode, etc,.)?: Unable Help needed moving to and from a bed to chair (  including a wheelchair)?: Total Help needed walking in hospital room?: Total Help needed climbing 3-5 steps with a railing? : Total 6 Click Score: 6    End of Session Equipment Utilized During Treatment: Gait belt Activity Tolerance: Patient limited by pain;Patient limited by fatigue Patient left: with call bell/phone within reach;in bed;with nursing/sitter in room Nurse Communication: Mobility status;Precautions PT Visit Diagnosis: Other abnormalities of gait and mobility (R26.89)     Time: 1610-9604 PT Time Calculation (min) (ACUTE ONLY): 9 min  Charges:  $Therapeutic Activity: 8-22 mins                    G Codes:       Carmelia Bake, PT, DPT 08/16/2017 Pager: [phone]  York Ram E 08/16/2017, 3:40 PM

## 2017-08-16 NOTE — Progress Notes (Signed)
Patient ID: Garrett Snow, male   DOB: 1918-10-07, 83 y.o.   MRN: [id number]     Subjective:  Patient reports pain as mild to moderate.  Alert and follows commands no acute distress  Objective:   VITALS:   Vitals:   08/15/17 1700 08/15/17 2055 08/16/17 0410 08/16/17 1311  BP: (!) 105/43 (!) 118/48 (!) 121/53 (!) 125/56  Pulse: 80 85 73 (!) 59  Resp: 18 18 18    Temp: 97.8 F (36.6 C) 98.9 F (37.2 C) 98.7 F (37.1 C) 97.7 F (36.5 C)  TempSrc: Oral Oral Oral Oral  SpO2: 91% 92% 94% 93%  Weight:      Height:        ABD soft Sensation intact distally Dorsiflexion/Plantar flexion intact Incision: dressing C/D/I and scant drainage   Lab Results  Component Value Date   WBC 8.7 08/16/2017   HGB 8.8 (L) 08/16/2017   HCT 27.4 (L) 08/16/2017   MCV 95.8 08/16/2017   PLT 248 08/16/2017   BMET    Component Value Date/Time   NA 141 08/16/2017 0408   NA 143 03/05/2014 1053   K 4.1 08/16/2017 0408   K 4.5 03/05/2014 1053   CL 114 (H) 08/16/2017 0408   CO2 22 08/16/2017 0408   CO2 24 03/05/2014 1053   GLUCOSE 126 (H) 08/16/2017 0408   GLUCOSE 96 03/05/2014 1053   BUN 35 (H) 08/16/2017 0408   BUN 27.2 (H) 03/05/2014 1053   CREATININE 1.88 (H) 08/16/2017 0408   CREATININE 1.2 03/05/2014 1053   CALCIUM 8.8 (L) 08/16/2017 0408   CALCIUM 9.6 03/05/2014 1053   GFRNONAA 29 (L) 08/16/2017 0408   GFRAA 33 (L) 08/16/2017 0408     Assessment/Plan: 2 Days Post-Op   Principal Problem:   Fracture of femoral neck, right (HCC) Active Problems:   HTN (hypertension), benign   AAA (abdominal aortic aneurysm) (HCC)   Hip fracture (HCC)   Advance diet Up with therapy WBAT Dry dressing PRN Continue plan per medicine   Remonia Richter 08/16/2017, 2:50 PM  Discussed and agree with above.    [name], MD Cell [phone]

## 2017-08-17 ENCOUNTER — Inpatient Hospital Stay (HOSPITAL_COMMUNITY): Payer: Medicare Other

## 2017-08-17 LAB — CBC
HCT: 23.7 % — ABNORMAL LOW (ref 39.0–52.0)
HEMOGLOBIN: 7.8 g/dL — AB (ref 13.0–17.0)
MCH: 31.3 pg (ref 26.0–34.0)
MCHC: 32.9 g/dL (ref 30.0–36.0)
MCV: 95.2 fL (ref 78.0–100.0)
Platelets: 263 10*3/uL (ref 150–400)
RBC: 2.49 MIL/uL — AB (ref 4.22–5.81)
RDW: 13.7 % (ref 11.5–15.5)
WBC: 10.3 10*3/uL (ref 4.0–10.5)

## 2017-08-17 LAB — HEMOGLOBIN AND HEMATOCRIT, BLOOD
HEMATOCRIT: 28.8 % — AB (ref 39.0–52.0)
HEMOGLOBIN: 9.4 g/dL — AB (ref 13.0–17.0)

## 2017-08-17 LAB — BASIC METABOLIC PANEL
ANION GAP: 5 (ref 5–15)
BUN: 35 mg/dL — ABNORMAL HIGH (ref 6–20)
CALCIUM: 8.8 mg/dL — AB (ref 8.9–10.3)
CHLORIDE: 115 mmol/L — AB (ref 101–111)
CO2: 21 mmol/L — AB (ref 22–32)
CREATININE: 1.46 mg/dL — AB (ref 0.61–1.24)
GFR calc Af Amer: 45 mL/min — ABNORMAL LOW (ref >60)
GFR calc non Af Amer: 39 mL/min — ABNORMAL LOW (ref >60)
GLUCOSE: 109 mg/dL — AB (ref 65–99)
Potassium: 3.9 mmol/L (ref 3.5–5.1)
Sodium: 141 mmol/L (ref 135–145)

## 2017-08-17 LAB — PREPARE RBC (CROSSMATCH)

## 2017-08-17 MED ORDER — IOPAMIDOL (ISOVUE-300) INJECTION 61%
30.0000 mL | Freq: Once | INTRAVENOUS | Status: AC
  Administered 2017-08-17: 30 mL via ORAL

## 2017-08-17 MED ORDER — SODIUM CHLORIDE 0.9 % IV SOLN
Freq: Once | INTRAVENOUS | Status: AC
  Administered 2017-08-17: 10:00:00 via INTRAVENOUS

## 2017-08-17 MED ORDER — IOPAMIDOL (ISOVUE-300) INJECTION 61%
INTRAVENOUS | Status: AC
  Administered 2017-08-17: 16:00:00
  Filled 2017-08-17: qty 30

## 2017-08-17 NOTE — Anesthesia Postprocedure Evaluation (Addendum)
Anesthesia Post Note  Patient: Garrett Snow  Procedure(s) Performed: ARTHROPLASTY BIPOLAR HIP (HEMIARTHROPLASTY) (Right )     Patient location during evaluation: PACU Anesthesia Type: General Level of consciousness: awake and patient cooperative Pain management: pain level controlled Vital Signs Assessment: post-procedure vital signs reviewed and stable Respiratory status: spontaneous breathing, nonlabored ventilation, respiratory function stable and patient connected to nasal cannula oxygen Cardiovascular status: stable and blood pressure returned to baseline Postop Assessment: no apparent nausea or vomiting Anesthetic complications: no    Last Vitals:  Vitals:   08/16/17 2118 08/17/17 0440  BP: (!) 123/41 (!) 120/49  Pulse: 73 72  Resp: 20 18  Temp: 37.2 C 36.9 C  SpO2: 96% 92%    Last Pain:  Vitals:   08/17/17 0804  TempSrc:   PainSc: 0-No pain                 Jerrol Helmers

## 2017-08-17 NOTE — Progress Notes (Signed)
Pt d/c via [facility] to [facility].  Pt stable, IV removed, pt belongings sent with family members.

## 2017-08-17 NOTE — Clinical Social Work Placement (Signed)
     2:53 PM Patient and family chose bed at [facility] send dc docs to facility.   Patient will transport by [facility]. RN please call [phone] Press 1, then 3, No emergency.   Patient ssn # located on facesheet in packet on chart.  RN report number [phone]  CLINICAL SOCIAL WORK PLACEMENT  NOTE  Date:  08/17/2017  Patient Details  Name: [name] MRN: [id number] Date of Birth: 09-02-1918  Clinical Social Work is seeking post-discharge placement for this patient at the [place] level of care (*CSW will initial, date and re-position this form in  chart as items are completed):  Yes   Patient/family provided with [facility] Work Department's list of facilities offering this level of care within the geographic area requested by the patient (or if unable, by the patient's family).  Yes   Patient/family informed of their freedom to choose among providers that offer the needed level of care, that participate in Medicare, Medicaid or managed care program needed by the patient, have an available bed and are willing to accept the patient.  Yes   Patient/family informed of [facility]'s ownership interest in [facility] and [facility], as well as of the fact that they are under no obligation to receive care at these facilities.  PASRR submitted to EDS on       PASRR number received on 08/16/17     Existing PASRR number confirmed on       FL2 transmitted to all facilities in geographic area requested by pt/family on 08/16/17     FL2 transmitted to all facilities within larger geographic area on       Patient informed that his/her managed care company has contracts with or will negotiate with certain facilities, including the following:        Yes   Patient/family informed of bed offers received.  Patient chooses bed at [facility], [facility]     Physician recommends and patient chooses bed at [place], [place]    Patient to  be transferred to [place] on 08/17/17.  Patient to be transferred to facility by EMS     Patient family notified on 08/17/17 of transfer.  Name of family member notified:  [name], son     PHYSICIAN       Additional Comment:    _______________________________________________ Servando Snare, LCSW 08/17/2017, 2:53 PM

## 2017-08-17 NOTE — Progress Notes (Signed)
Patient ID: Garrett Snow, male   DOB: [birth year], 83 y.o.   MRN: [id number]     Subjective:  Patient denies hip pain.  Alert and follows commands.  No acute distress.  Not yet OOB w/ therapy.  Objective:   VITALS:   Vitals:   08/16/17 0410 08/16/17 1311 08/16/17 2118 08/17/17 0440  BP: (!) 121/53 (!) 125/56 (!) 123/41 (!) 120/49  Pulse: 73 (!) 59 73 72  Resp: 18  20 18   Temp: 98.7 F (37.1 C) 97.7 F (36.5 C) 98.9 F (37.2 C) 98.5 F (36.9 C)  TempSrc: Oral Oral Oral Oral  SpO2: 94% 93% 96% 92%  Weight:      Height:       Physical exam: General:  Alert, interactive.  NAD.  Responds appropriately to questions. ABD soft Sensation intact distally Dorsiflexion/Plantar flexion intact Incision: dressing C/D/I w/ scant dry drainage   Lab Results  Component Value Date   WBC 10.3 08/17/2017   HGB 7.8 (L) 08/17/2017   HCT 23.7 (L) 08/17/2017   MCV 95.2 08/17/2017   PLT 263 08/17/2017   BMET    Component Value Date/Time   NA 141 08/17/2017 0431   NA 143 03/05/2014 1053   K 3.9 08/17/2017 0431   K 4.5 03/05/2014 1053   CL 115 (H) 08/17/2017 0431   CO2 21 (L) 08/17/2017 0431   CO2 24 03/05/2014 1053   GLUCOSE 109 (H) 08/17/2017 0431   GLUCOSE 96 03/05/2014 1053   BUN 35 (H) 08/17/2017 0431   BUN 27.2 (H) 03/05/2014 1053   CREATININE 1.46 (H) 08/17/2017 0431   CREATININE 1.2 03/05/2014 1053   CALCIUM 8.8 (L) 08/17/2017 0431   CALCIUM 9.6 03/05/2014 1053   GFRNONAA 39 (L) 08/17/2017 0431   GFRAA 45 (L) 08/17/2017 0431     Assessment/Plan: 3 Days Post-Op   Principal Problem:   Fracture of femoral neck, right (HCC) Active Problems:   HTN (hypertension), benign   AAA (abdominal aortic aneurysm) (HCC)   Hip fracture (HCC)  ABLA: Hgb 7.8 << 11.2.  Platelets WNL.  Likely with dilutional component with IVF's given dt AKI (AKI impoved).   Likely has some residual hematoma from fracture / surgery, but his hip and thigh are soft, his dressing has some scant, dry,  stable drainage -  There is no sign of active bleeding.  Agree with plan for 1 unit today.  Right Femoral Neck Fracture: Stable from an orthopedic perspective.  Continue to work toward mobilization w/ therapy. WBAT RLE Dry dressing PRN Lovenox for 3 weeks, 40 mg subcutaneous daily Tylenol for pain Continue plan per medicine Likely d/c to SNF  Follow up with [name] in the office in 2 weeks.   Charna Elizabeth Martensen III 08/17/2017, 10:56 AM

## 2017-08-17 NOTE — Discharge Summary (Addendum)
Physician Discharge Summary  Garrett Snow RJJ:[id number] DOB: [birth year] DOA: 08/14/2017  PCP: [name], MD  Admit date: 08/14/2017 Discharge date: 08/17/2017  Admitted From: Home Disposition:  SNF   Recommendations for Outpatient Follow-up:  1. Follow up with PCP in 1 week 2. Follow up with [name] (orthopedic surgery) in 2 weeks 3. Please obtain BMP/CBC in 1 week  4. Incidental findings of CT abd/pelvis as follows: There is a cystic lesion within the neck of pancreas, new from 02/15/2009. Consider more definitive characterization with nonemergent pancreas protocol MRI. Ectatic abdominal aorta at risk for aneurysm development. Recommend followup by ultrasound in 5 years. Could consider outpatient follow up if desired, but at patient's current age, unlikely to be of clinical significance  Discharge Condition: Stable CODE STATUS: Full  Diet recommendation: Heart healthy   Brief/Interim Summary: Garrett Snow is a 83 yo male with history of AAA, hypertension presented with fall, right hip pain.  He had suffered several falls in the last couple of weeks.  He was walking with a walker due to falls and imbalance and has been complaining of right leg pain.  He was found to have right hip fracture.  He underwent hemiarthroplasty on 12/26. Hospitalization has been complicated by acute kidney injury as well as anemia.  His kidney function improved with IV fluids.  He was found to have progressive anemia postoperatively.  He underwent CT abdomen and pelvis as well as CT right femur which were unremarkable for large hematomas.  His anemia was likely postoperative blood loss as well as dilutional from IV fluids.  He was given 1 unit of packed red blood cells prior to discharge to skilled nursing facility.  Discharge Diagnoses:  Principal Problem:   Fracture of femoral neck, right (HCC) Active Problems:   HTN (hypertension), benign   AAA (abdominal aortic aneurysm) (HCC)   Hip fracture  (HCC)  Right hip femoral neck fracture -Status post right hemiarthroplasty 12/26 with [name] -Weight-bear as tolerated -DVT prophylaxis with Lovenox 40 mg subcutaneous daily for 3 weeks -Follow-up with [name] in 2 weeks  Acute kidney injury on CKD stage III -Baseline creatinine 1.4-1.5. Improved with IVF.   Acute blood loss anemia -Secondary to operative blood loss -CT abdomen and pelvis as well as CT right femur which were unremarkable for large hematomas. His anemia was likely postoperative blood loss as well as dilutional from IV fluids.  He was given 1 unit of packed red blood cells prior to discharge to skilled nursing facility.  Essential hypertension -Continue Norvasc -Blood pressure stable    Discharge Instructions  Discharge Instructions    Call MD for:  difficulty breathing, headache or visual disturbances   Complete by:  As directed    Call MD for:  extreme fatigue   Complete by:  As directed    Call MD for:  persistant dizziness or light-headedness   Complete by:  As directed    Call MD for:  persistant nausea and vomiting   Complete by:  As directed    Call MD for:  severe uncontrolled pain   Complete by:  As directed    Call MD for:  temperature >100.4   Complete by:  As directed    Diet - low sodium heart healthy   Complete by:  As directed    Discharge instructions   Complete by:  As directed    You were cared for by a hospitalist during your hospital stay. If you have  any questions about your discharge medications or the care you received while you were in the hospital after you are discharged, you can call the unit and asked to speak with the hospitalist on call if the hospitalist that took care of you is not available. Once you are discharged, your primary care physician will handle any further medical issues. Please note that NO REFILLS for any discharge medications will be authorized once you are discharged, as it is imperative that you return  to your primary care physician (or establish a relationship with a primary care physician if you do not have one) for your aftercare needs so that they can reassess your need for medications and monitor your lab values.   Increase activity slowly   Complete by:  As directed    Weight bearing as tolerated   Complete by:  As directed      Allergies as of 08/17/2017   No Known Allergies     Medication List    TAKE these medications   acetaminophen 325 MG tablet Commonly known as:  TYLENOL Take 2 tablets (650 mg total) by mouth every 6 (six) hours as needed.   amLODipine 5 MG tablet Commonly known as:  NORVASC Take 5 mg by mouth daily.   cyanocobalamin 500 MCG tablet Take 1,000 mcg by mouth daily.   enoxaparin 40 MG/0.4ML injection Commonly known as:  LOVENOX Inject 0.4 mLs (40 mg total) into the skin daily.   finasteride 5 MG tablet Commonly known as:  PROSCAR Take 5 mg by mouth daily.   ICAPS AREDS 2 Caps Take 1 Can by mouth 2 (two) times daily.   iron polysaccharides 150 MG capsule Commonly known as:  FERREX 150 TAKE ONE CAPSULE BY MOUTH TWICE DAILY   sennosides-docusate sodium 8.6-50 MG tablet Commonly known as:  SENOKOT-S Take 2 tablets by mouth daily.   sodium chloride 0.65 % Soln nasal spray Commonly known as:  OCEAN Place 1 spray into both nostrils as needed for congestion.   St Johns Wort 150 MG Caps Take 1 capsule by mouth daily.   Vitamin D 2000 units Caps Take 2,000 Units by mouth daily.            Discharge Care Instructions  (From admission, onward)        Start     Ordered   08/14/17 0000  Weight bearing as tolerated     08/14/17 1955     Follow-up Information    [name], MD. Schedule an appointment as soon as possible for a visit in 2 week(s).   Specialty:  Orthopedic Surgery Contact information: [place] [address] [phone]        [name], MD. Schedule an appointment as  soon as possible for a visit in 1 week(s).   Specialty:  Internal Medicine Contact information: [place] [address] [phone]          No Known Allergies  Consultations:  Orthopedic surgery    Procedures/Studies: Ct Abdomen Pelvis Wo Contrast  Result Date: 08/17/2017 CLINICAL DATA:  Anemia. Unexplained. Rule out retroperitoneal hematoma EXAM: CT ABDOMEN AND PELVIS WITHOUT CONTRAST TECHNIQUE: Multidetector CT imaging of the abdomen and pelvis was performed following the standard protocol without IV contrast. COMPARISON:  02/15/2009 FINDINGS: Lower chest: Small bilateral pleural effusions identified. Hepatobiliary: No suspicious liver abnormality. The gallbladder appears normal. Pancreas: No pancreatic ductal dilatation or inflammation. There is a cystic mass within the neck of  pancreas measuring 3.9 cm, image 17 of series 2. Incompletely characterized without IV contrast. Spleen: Calcified granulomas noted within the spleen. Adrenals/Urinary Tract: Stable 1.2 cm right adrenal nodule compatible with a benign adenoma. Normal appearance of the left adrenal gland. Bilateral renal cortical scarring identified. Multiple kidney cysts are identified which are incompletely characterized without IV contrast material. The largest arises from the inferior pole of the left kidney measuring 3.3 cm. No mass or hydronephrosis identified. Urinary bladder appears normal. Stomach/Bowel: Stomach is within normal limits. No evidence of bowel wall thickening, distention, or inflammatory changes. Vascular/Lymphatic: Infrarenal abdominal aorta is ectatic measuring 2.5 cm. No adenopathy. No pelvic or inguinal adenopathy identified. Reproductive: Prostate is unremarkable. Other: Post operative change from right hip arthroplasty identified. Gas is identified within the surrounding soft tissues. There is edema of the overlying subcutaneous fat and muscle surrounding the right hip and femur. See report  from CT of the right hip also performed today. No evidence for retroperitoneal or intraperitoneal hemorrhage. Bilateral inguinal hernias are identified which contain fat only. There is also a hernia involving the lower ventral abdominal wall containing fat only. Musculoskeletal: Status post right hip hemiarthroplasty. See report from CT of the right femur from today. Multi level degenerative disc disease is identified within the lumbar spine. IMPRESSION: 1. No evidence for intraperitoneal or retroperitoneal hematoma. 2. Postoperative changes from right hip hemiarthroplasty. See report from CT of the right lower extremity performed today. 3. There is a cystic lesion within the neck of pancreas, new from 02/15/2009. Incompletely characterized without IV contrast material. Consider more definitive characterization with nonemergent pancreas protocol MRI. 4. Aortic Atherosclerosis (ICD10-I70.0). Ectatic abdominal aorta at risk for aneurysm development. Recommend followup by ultrasound in 5 years. This recommendation follows ACR consensus guidelines: White Paper of the ACR Incidental Findings Committee II on Vascular Findings. J Am Coll Radiol 2013; 10:789-794. 5. Small bilateral pleural effusions 6. Incidental note of right adrenal gland adenoma and bilateral kidney cysts. Electronically Signed   By: Kerby Moors M.D.   On: 08/17/2017 12:56   Ct Femur Right Wo Contrast  Result Date: 08/17/2017 CLINICAL DATA:  Anemia status post right hip hemiarthroplasty on August 14, 2017. Evaluate for hematoma. EXAM: CT OF THE LOWER RIGHT EXTREMITY WITHOUT CONTRAST TECHNIQUE: Multidetector CT imaging of the right lower extremity was performed according to the standard protocol. COMPARISON:  Right hip x-rays dated August 14, 2017. FINDINGS: Bones/Joint/Cartilage Postsurgical changes related to right hip hemiarthroplasty. No evidence of hardware failure or loosening. No acute fracture or malalignment. Small left knee joint  effusion. Moderate left knee Baker cyst. Ligaments Suboptimally assessed by CT. Muscles and Tendons Small, thin low-density fluid collection containing a few foci of air overlying the vastus lateralis muscle is favored to represent a postsurgical seroma. Mild stranding and foci of air within the left adductor compartment is favored to be postsurgical. Soft tissues No large hematoma seen. Soft tissue stranding and edema overlying the right hip. IMPRESSION: 1. Postsurgical changes within the soft tissues about the right hip related to recent hemiarthroplasty. No large hematoma. Electronically Signed   By: Titus Dubin M.D.   On: 08/17/2017 13:00   Dg Hip Port Unilat With Pelvis 1v Right  Result Date: 08/14/2017 CLINICAL DATA:  Arthroplasty EXAM: DG HIP (WITH OR WITHOUT PELVIS) 1V PORT RIGHT COMPARISON:  08/14/2017 FINDINGS: Status post right hip replacement with normal alignment. Pubic symphysis is intact. Moderate arthritis of the left hip. Gas in the soft tissues consistent with recent operative status. Vascular  calcification IMPRESSION: Interval right hip replacement with expected postsurgical changes Electronically Signed   By: Donavan Foil M.D.   On: 08/14/2017 21:10   Dg Hip Unilat W Or Wo Pelvis 2-3 Views Right  Result Date: 08/14/2017 CLINICAL DATA:  Right hip pain and history of fall. EXAM: DG HIP (WITH OR WITHOUT PELVIS) 2-3V RIGHT COMPARISON:  CT 08/25/2009 FINDINGS: Fracture in the proximal right femur. Fracture appears to be involving the right femoral neck. There is superior displacement of the right femoral trochanteric region. Right hip is located. Pelvic bony ring is intact. Left hip is intact. Endplate disease in the lower lumbar spine. IMPRESSION: Fracture of the right femoral neck. Electronically Signed   By: Markus Daft M.D.   On: 08/14/2017 11:09      Discharge Exam: Vitals:   08/17/17 0440 08/17/17 1243  BP: (!) 120/49 (!) 114/50  Pulse: 72 67  Resp: 18   Temp: 98.5 F  (36.9 C) 97.7 F (36.5 C)  SpO2: 92% 96%   Vitals:   08/16/17 1311 08/16/17 2118 08/17/17 0440 08/17/17 1243  BP: (!) 125/56 (!) 123/41 (!) 120/49 (!) 114/50  Pulse: (!) 59 73 72 67  Resp:  20 18   Temp: 97.7 F (36.5 C) 98.9 F (37.2 C) 98.5 F (36.9 C) 97.7 F (36.5 C)  TempSrc: Oral Oral Oral Oral  SpO2: 93% 96% 92% 96%  Weight:      Height:        General: Pt is alert, awake, not in acute distress Cardiovascular: RRR, S1/S2 +, no rubs, no gallops Respiratory: CTA bilaterally, no wheezing, no rhonchi Abdominal: Soft, NT, ND, bowel sounds + Extremities: no edema, no cyanosis, right leg dressing is clean and dry, no significant bruising of right hip, soft thigh     The results of significant diagnostics from this hospitalization (including imaging, microbiology, ancillary and laboratory) are listed below for reference.     Microbiology: No results found for this or any previous visit (from the past 240 hour(s)).   Labs: BNP (last 3 results) No results for input(s): BNP in the last 8760 hours. Basic Metabolic Panel: Recent Labs  Lab 08/14/17 1144 08/15/17 0359 08/16/17 0408 08/17/17 0431  NA 140 140 141 141  K 4.1 4.0 4.1 3.9  CL 111 112* 114* 115*  CO2 23 23 22  21*  GLUCOSE 109* 141* 126* 109*  BUN 32* 30* 35* 35*  CREATININE 1.40* 1.50* 1.88* 1.46*  CALCIUM 9.3 8.9 8.8* 8.8*  MG 2.1  --  1.9  --    Liver Function Tests: Recent Labs  Lab 08/14/17 1144  AST 25  ALT 16*  ALKPHOS 109  BILITOT 0.7  PROT 6.1*  ALBUMIN 3.5   No results for input(s): LIPASE, AMYLASE in the last 168 hours. No results for input(s): AMMONIA in the last 168 hours. CBC: Recent Labs  Lab 08/14/17 1144 08/15/17 0359 08/16/17 0408 08/17/17 0431  WBC 7.6 7.9 8.7 10.3  NEUTROABS 5.3  --   --   --   HGB 11.2* 9.9* 8.8* 7.8*  HCT 34.9* 30.9* 27.4* 23.7*  MCV 94.3 95.4 95.8 95.2  PLT 282 267 248 263   Cardiac Enzymes: No results for input(s): CKTOTAL, CKMB, CKMBINDEX,  TROPONINI in the last 168 hours. BNP: Invalid input(s): POCBNP CBG: No results for input(s): GLUCAP in the last 168 hours. D-Dimer No results for input(s): DDIMER in the last 72 hours. Hgb A1c No results for input(s): HGBA1C in the last 72 hours.  Lipid Profile No results for input(s): CHOL, HDL, LDLCALC, TRIG, CHOLHDL, LDLDIRECT in the last 72 hours. Thyroid function studies No results for input(s): TSH, T4TOTAL, T3FREE, THYROIDAB in the last 72 hours.  Invalid input(s): FREET3 Anemia work up No results for input(s): VITAMINB12, FOLATE, FERRITIN, TIBC, IRON, RETICCTPCT in the last 72 hours. Urinalysis No results found for: COLORURINE, APPEARANCEUR, LABSPEC, [place], GLUCOSEU, HGBUR, BILIRUBINUR, KETONESUR, PROTEINUR, UROBILINOGEN, NITRITE, LEUKOCYTESUR Sepsis Labs Invalid input(s): PROCALCITONIN,  WBC,  LACTICIDVEN Microbiology No results found for this or any previous visit (from the past 240 hour(s)).   Time coordinating discharge: 40 minutes  SIGNED:  Dessa Phi, DO [facility] Pager [phone]  If 7PM-7AM, please contact night-coverage [email or web address] Password [email or web address] 08/17/2017, 1:35 PM

## 2017-08-19 LAB — BPAM RBC
Blood Product Expiration Date: 201901262359
ISSUE DATE / TIME: 201812291406
UNIT TYPE AND RH: 5100

## 2017-08-19 LAB — TYPE AND SCREEN
ABO/RH(D): O POS
ANTIBODY SCREEN: NEGATIVE
Unit division: 0

## 2017-09-04 ENCOUNTER — Inpatient Hospital Stay (HOSPITAL_COMMUNITY): Payer: Medicare Other

## 2017-09-04 ENCOUNTER — Emergency Department (HOSPITAL_COMMUNITY): Payer: Medicare Other

## 2017-09-04 ENCOUNTER — Other Ambulatory Visit: Payer: Self-pay

## 2017-09-04 ENCOUNTER — Inpatient Hospital Stay (HOSPITAL_COMMUNITY)
Admission: EM | Admit: 2017-09-04 | Discharge: 2017-09-15 | DRG: 871 | Disposition: A | Discharge status: Hospice | Payer: Medicare Other | Attending: Internal Medicine | Admitting: Internal Medicine

## 2017-09-04 ENCOUNTER — Encounter (HOSPITAL_COMMUNITY): Payer: Self-pay | Admitting: Emergency Medicine

## 2017-09-04 DIAGNOSIS — E876 Hypokalemia: Secondary | ICD-10-CM | POA: Diagnosis present

## 2017-09-04 DIAGNOSIS — R778 Other specified abnormalities of plasma proteins: Secondary | ICD-10-CM

## 2017-09-04 DIAGNOSIS — R7989 Other specified abnormal findings of blood chemistry: Secondary | ICD-10-CM

## 2017-09-04 DIAGNOSIS — Z96641 Presence of right artificial hip joint: Secondary | ICD-10-CM | POA: Diagnosis not present

## 2017-09-04 DIAGNOSIS — F0390 Unspecified dementia without behavioral disturbance: Secondary | ICD-10-CM | POA: Diagnosis present

## 2017-09-04 DIAGNOSIS — R338 Other retention of urine: Secondary | ICD-10-CM | POA: Diagnosis not present

## 2017-09-04 DIAGNOSIS — I10 Essential (primary) hypertension: Secondary | ICD-10-CM | POA: Diagnosis not present

## 2017-09-04 DIAGNOSIS — A419 Sepsis, unspecified organism: Secondary | ICD-10-CM | POA: Diagnosis not present

## 2017-09-04 DIAGNOSIS — G9341 Metabolic encephalopathy: Secondary | ICD-10-CM | POA: Diagnosis present

## 2017-09-04 DIAGNOSIS — R339 Retention of urine, unspecified: Secondary | ICD-10-CM | POA: Diagnosis present

## 2017-09-04 DIAGNOSIS — R740 Nonspecific elevation of levels of transaminase and lactic acid dehydrogenase [LDH]: Secondary | ICD-10-CM

## 2017-09-04 DIAGNOSIS — D72829 Elevated white blood cell count, unspecified: Secondary | ICD-10-CM | POA: Diagnosis not present

## 2017-09-04 DIAGNOSIS — R0602 Shortness of breath: Secondary | ICD-10-CM | POA: Diagnosis not present

## 2017-09-04 DIAGNOSIS — Z87891 Personal history of nicotine dependence: Secondary | ICD-10-CM

## 2017-09-04 DIAGNOSIS — D649 Anemia, unspecified: Secondary | ICD-10-CM

## 2017-09-04 DIAGNOSIS — I714 Abdominal aortic aneurysm, without rupture, unspecified: Secondary | ICD-10-CM | POA: Diagnosis present

## 2017-09-04 DIAGNOSIS — R7881 Bacteremia: Secondary | ICD-10-CM

## 2017-09-04 DIAGNOSIS — E870 Hyperosmolality and hypernatremia: Secondary | ICD-10-CM | POA: Diagnosis present

## 2017-09-04 DIAGNOSIS — L97929 Non-pressure chronic ulcer of unspecified part of left lower leg with unspecified severity: Secondary | ICD-10-CM | POA: Diagnosis not present

## 2017-09-04 DIAGNOSIS — I82220 Acute embolism and thrombosis of inferior vena cava: Secondary | ICD-10-CM | POA: Diagnosis present

## 2017-09-04 DIAGNOSIS — E43 Unspecified severe protein-calorie malnutrition: Secondary | ICD-10-CM | POA: Diagnosis present

## 2017-09-04 DIAGNOSIS — I129 Hypertensive chronic kidney disease with stage 1 through stage 4 chronic kidney disease, or unspecified chronic kidney disease: Secondary | ICD-10-CM | POA: Diagnosis present

## 2017-09-04 DIAGNOSIS — M7989 Other specified soft tissue disorders: Secondary | ICD-10-CM | POA: Diagnosis not present

## 2017-09-04 DIAGNOSIS — L8990 Pressure ulcer of unspecified site, unspecified stage: Secondary | ICD-10-CM

## 2017-09-04 DIAGNOSIS — Z8781 Personal history of (healed) traumatic fracture: Secondary | ICD-10-CM | POA: Diagnosis not present

## 2017-09-04 DIAGNOSIS — I248 Other forms of acute ischemic heart disease: Secondary | ICD-10-CM | POA: Diagnosis present

## 2017-09-04 DIAGNOSIS — I700 Atherosclerosis of aorta: Secondary | ICD-10-CM | POA: Diagnosis present

## 2017-09-04 DIAGNOSIS — B9562 Methicillin resistant Staphylococcus aureus infection as the cause of diseases classified elsewhere: Secondary | ICD-10-CM | POA: Diagnosis not present

## 2017-09-04 DIAGNOSIS — Z9181 History of falling: Secondary | ICD-10-CM | POA: Diagnosis not present

## 2017-09-04 DIAGNOSIS — R4182 Altered mental status, unspecified: Secondary | ICD-10-CM

## 2017-09-04 DIAGNOSIS — Z8679 Personal history of other diseases of the circulatory system: Secondary | ICD-10-CM | POA: Diagnosis not present

## 2017-09-04 DIAGNOSIS — A4102 Sepsis due to Methicillin resistant Staphylococcus aureus: Principal | ICD-10-CM | POA: Diagnosis present

## 2017-09-04 DIAGNOSIS — N189 Chronic kidney disease, unspecified: Secondary | ICD-10-CM | POA: Diagnosis not present

## 2017-09-04 DIAGNOSIS — J189 Pneumonia, unspecified organism: Secondary | ICD-10-CM | POA: Diagnosis present

## 2017-09-04 DIAGNOSIS — N281 Cyst of kidney, acquired: Secondary | ICD-10-CM | POA: Diagnosis present

## 2017-09-04 DIAGNOSIS — R748 Abnormal levels of other serum enzymes: Secondary | ICD-10-CM | POA: Diagnosis not present

## 2017-09-04 DIAGNOSIS — R2243 Localized swelling, mass and lump, lower limb, bilateral: Secondary | ICD-10-CM

## 2017-09-04 DIAGNOSIS — L89899 Pressure ulcer of other site, unspecified stage: Secondary | ICD-10-CM | POA: Diagnosis not present

## 2017-09-04 DIAGNOSIS — N183 Chronic kidney disease, stage 3 (moderate): Secondary | ICD-10-CM | POA: Diagnosis present

## 2017-09-04 DIAGNOSIS — R6520 Severe sepsis without septic shock: Secondary | ICD-10-CM | POA: Diagnosis present

## 2017-09-04 DIAGNOSIS — N320 Bladder-neck obstruction: Secondary | ICD-10-CM | POA: Diagnosis present

## 2017-09-04 DIAGNOSIS — E878 Other disorders of electrolyte and fluid balance, not elsewhere classified: Secondary | ICD-10-CM | POA: Diagnosis present

## 2017-09-04 DIAGNOSIS — R7401 Elevation of levels of liver transaminase levels: Secondary | ICD-10-CM

## 2017-09-04 DIAGNOSIS — S72001A Fracture of unspecified part of neck of right femur, initial encounter for closed fracture: Secondary | ICD-10-CM | POA: Diagnosis present

## 2017-09-04 DIAGNOSIS — I6202 Nontraumatic subacute subdural hemorrhage: Secondary | ICD-10-CM | POA: Diagnosis not present

## 2017-09-04 DIAGNOSIS — N179 Acute kidney failure, unspecified: Secondary | ICD-10-CM | POA: Diagnosis present

## 2017-09-04 DIAGNOSIS — Z85038 Personal history of other malignant neoplasm of large intestine: Secondary | ICD-10-CM

## 2017-09-04 DIAGNOSIS — L89610 Pressure ulcer of right heel, unstageable: Secondary | ICD-10-CM | POA: Diagnosis present

## 2017-09-04 DIAGNOSIS — S72009A Fracture of unspecified part of neck of unspecified femur, initial encounter for closed fracture: Secondary | ICD-10-CM | POA: Diagnosis present

## 2017-09-04 DIAGNOSIS — S72001D Fracture of unspecified part of neck of right femur, subsequent encounter for closed fracture with routine healing: Secondary | ICD-10-CM

## 2017-09-04 DIAGNOSIS — Z66 Do not resuscitate: Secondary | ICD-10-CM | POA: Diagnosis present

## 2017-09-04 DIAGNOSIS — I340 Nonrheumatic mitral (valve) insufficiency: Secondary | ICD-10-CM | POA: Diagnosis not present

## 2017-09-04 DIAGNOSIS — L97919 Non-pressure chronic ulcer of unspecified part of right lower leg with unspecified severity: Secondary | ICD-10-CM | POA: Diagnosis not present

## 2017-09-04 DIAGNOSIS — S065X9D Traumatic subdural hemorrhage with loss of consciousness of unspecified duration, subsequent encounter: Secondary | ICD-10-CM

## 2017-09-04 DIAGNOSIS — L89620 Pressure ulcer of left heel, unstageable: Secondary | ICD-10-CM | POA: Diagnosis present

## 2017-09-04 LAB — COMPREHENSIVE METABOLIC PANEL
ALBUMIN: 1.9 g/dL — AB (ref 3.5–5.0)
ALK PHOS: 118 U/L (ref 38–126)
ALT: 20 U/L (ref 17–63)
AST: 30 U/L (ref 15–41)
Anion gap: 8 (ref 5–15)
BUN: 53 mg/dL — ABNORMAL HIGH (ref 6–20)
CALCIUM: 8.5 mg/dL — AB (ref 8.9–10.3)
CHLORIDE: 115 mmol/L — AB (ref 101–111)
CO2: 23 mmol/L (ref 22–32)
CREATININE: 2.8 mg/dL — AB (ref 0.61–1.24)
GFR calc non Af Amer: 18 mL/min — ABNORMAL LOW (ref >60)
GFR, EST AFRICAN AMERICAN: 20 mL/min — AB (ref >60)
GLUCOSE: 130 mg/dL — AB (ref 65–99)
Potassium: 4 mmol/L (ref 3.5–5.1)
SODIUM: 146 mmol/L — AB (ref 135–145)
Total Bilirubin: 0.9 mg/dL (ref 0.3–1.2)
Total Protein: 4.7 g/dL — ABNORMAL LOW (ref 6.5–8.1)

## 2017-09-04 LAB — CBC WITH DIFFERENTIAL/PLATELET
Basophils Absolute: 0 10*3/uL (ref 0.0–0.1)
Basophils Relative: 0 %
EOS ABS: 0 10*3/uL (ref 0.0–0.7)
EOS PCT: 0 %
HCT: 30.2 % — ABNORMAL LOW (ref 39.0–52.0)
Hemoglobin: 9.6 g/dL — ABNORMAL LOW (ref 13.0–17.0)
LYMPHS ABS: 1.1 10*3/uL (ref 0.7–4.0)
LYMPHS PCT: 6 %
MCH: 29.6 pg (ref 26.0–34.0)
MCHC: 31.8 g/dL (ref 30.0–36.0)
MCV: 93.2 fL (ref 78.0–100.0)
MONO ABS: 0.7 10*3/uL (ref 0.1–1.0)
MONOS PCT: 4 %
Neutro Abs: 18.5 10*3/uL — ABNORMAL HIGH (ref 1.7–7.7)
Neutrophils Relative %: 90 %
PLATELETS: 185 10*3/uL (ref 150–400)
RBC: 3.24 MIL/uL — ABNORMAL LOW (ref 4.22–5.81)
RDW: 15.8 % — ABNORMAL HIGH (ref 11.5–15.5)
WBC: 20.4 10*3/uL — ABNORMAL HIGH (ref 4.0–10.5)

## 2017-09-04 LAB — URINALYSIS, ROUTINE W REFLEX MICROSCOPIC
BILIRUBIN URINE: NEGATIVE
GLUCOSE, UA: NEGATIVE mg/dL
Hgb urine dipstick: NEGATIVE
KETONES UR: NEGATIVE mg/dL
Leukocytes, UA: NEGATIVE
NITRITE: NEGATIVE
PH: 5 (ref 5.0–8.0)
Protein, ur: NEGATIVE mg/dL
SPECIFIC GRAVITY, URINE: 1.016 (ref 1.005–1.030)

## 2017-09-04 LAB — LACTIC ACID, PLASMA: Lactic Acid, Venous: 2 mmol/L (ref 0.5–1.9)

## 2017-09-04 LAB — TROPONIN I: TROPONIN I: 0.23 ng/mL — AB (ref <0.03)

## 2017-09-04 LAB — PROTIME-INR
INR: 1.65
PROTHROMBIN TIME: 19.4 s — AB (ref 11.4–15.2)

## 2017-09-04 LAB — PROCALCITONIN: Procalcitonin: 55.53 ng/mL

## 2017-09-04 LAB — CBG MONITORING, ED
GLUCOSE-CAPILLARY: 114 mg/dL — AB (ref 65–99)
Glucose-Capillary: 120 mg/dL — ABNORMAL HIGH (ref 65–99)

## 2017-09-04 LAB — I-STAT CG4 LACTIC ACID, ED
LACTIC ACID, VENOUS: 1.74 mmol/L (ref 0.5–1.9)
Lactic Acid, Venous: 1.62 mmol/L (ref 0.5–1.9)

## 2017-09-04 LAB — APTT: APTT: 40 s — AB (ref 24–36)

## 2017-09-04 LAB — TSH: TSH: 1.684 u[IU]/mL (ref 0.350–4.500)

## 2017-09-04 MED ORDER — SENNOSIDES-DOCUSATE SODIUM 8.6-50 MG PO TABS
2.0000 | ORAL_TABLET | Freq: Two times a day (BID) | ORAL | Status: DC
  Administered 2017-09-05 – 2017-09-13 (×18): 2 via ORAL
  Filled 2017-09-04 (×18): qty 2

## 2017-09-04 MED ORDER — VITAMIN B-12 1000 MCG PO TABS
1000.0000 ug | ORAL_TABLET | Freq: Every day | ORAL | Status: DC
  Administered 2017-09-05 – 2017-09-15 (×11): 1000 ug via ORAL
  Filled 2017-09-04 (×11): qty 1

## 2017-09-04 MED ORDER — VITAMIN D 50 MCG (2000 UT) PO CAPS: 2000.0000 [IU] | ORAL_CAPSULE | Freq: Every day | ORAL | Status: DC

## 2017-09-04 MED ORDER — SODIUM CHLORIDE 0.9 % IV BOLUS (SEPSIS)
1000.0000 mL | Freq: Once | INTRAVENOUS | Status: AC
  Administered 2017-09-04: 1000 mL via INTRAVENOUS

## 2017-09-04 MED ORDER — ENOXAPARIN SODIUM 30 MG/0.3ML ~~LOC~~ SOLN
30.0000 mg | SUBCUTANEOUS | Status: DC
  Administered 2017-09-04 – 2017-09-14 (×11): 30 mg via SUBCUTANEOUS
  Filled 2017-09-04 (×12): qty 0.3

## 2017-09-04 MED ORDER — SODIUM CHLORIDE 0.9 % IV BOLUS (SEPSIS)
500.0000 mL | Freq: Once | INTRAVENOUS | Status: AC
  Administered 2017-09-04: 500 mL via INTRAVENOUS

## 2017-09-04 MED ORDER — SALINE SPRAY 0.65 % NA SOLN: 1.0000 | NASAL | Status: DC | PRN

## 2017-09-04 MED ORDER — SENNA-DOCUSATE SODIUM 8.6-50 MG PO TABS: 2.0000 | ORAL_TABLET | Freq: Every day | ORAL | Status: DC

## 2017-09-04 MED ORDER — VANCOMYCIN HCL 10 G IV SOLR
1500.0000 mg | INTRAVENOUS | Status: AC
  Administered 2017-09-04: 1500 mg via INTRAVENOUS
  Filled 2017-09-04: qty 1500

## 2017-09-04 MED ORDER — ORAL CARE MOUTH RINSE
15.0000 mL | Freq: Two times a day (BID) | OROMUCOSAL | Status: DC
  Administered 2017-09-05 (×2): 15 mL via OROMUCOSAL

## 2017-09-04 MED ORDER — VANCOMYCIN HCL IN DEXTROSE 1-5 GM/200ML-% IV SOLN
1000.0000 mg | INTRAVENOUS | Status: DC
  Filled 2017-09-04: qty 200

## 2017-09-04 MED ORDER — ACETAMINOPHEN 325 MG PO TABS
650.0000 mg | ORAL_TABLET | Freq: Four times a day (QID) | ORAL | Status: DC | PRN
  Administered 2017-09-14: 650 mg via ORAL
  Filled 2017-09-04: qty 2

## 2017-09-04 MED ORDER — DEXTROSE 5 % IV SOLN
1.0000 g | INTRAVENOUS | Status: DC
  Administered 2017-09-05: 1 g via INTRAVENOUS
  Filled 2017-09-04 (×2): qty 1

## 2017-09-04 MED ORDER — DEXTROSE 5 % IV SOLN
2.0000 g | INTRAVENOUS | Status: AC
  Administered 2017-09-04: 2 g via INTRAVENOUS
  Filled 2017-09-04: qty 2

## 2017-09-04 MED ORDER — VITAMIN D 1000 UNITS PO TABS
2000.0000 [IU] | ORAL_TABLET | Freq: Every day | ORAL | Status: DC
  Administered 2017-09-05 – 2017-09-15 (×11): 2000 [IU] via ORAL
  Filled 2017-09-04 (×11): qty 2

## 2017-09-04 MED ORDER — SODIUM CHLORIDE 0.9 % IV SOLN
INTRAVENOUS | Status: DC
  Administered 2017-09-04: 22:00:00 via INTRAVENOUS

## 2017-09-04 MED ORDER — ICAPS AREDS 2 PO CAPS: 1.0000 | ORAL_CAPSULE | Freq: Two times a day (BID) | ORAL | Status: DC

## 2017-09-04 NOTE — ED Notes (Signed)
After assessing Pt's arms, this tech recommends that either of the Pt's IV's be used or to notify phlebotomy.

## 2017-09-04 NOTE — H&P (Signed)
History and Physical    Garrett Snow GXQ:[id number] DOB: 02/02/1919 DOA: 09/04/2017  PCP: [name], MD   Patient coming from: SNF  Chief Complaint: AMS and Increased Work of Breathing  HPI: Garrett Snow is a 84 y.o. male with medical history significant of HTN, DJD, Recent Right Femoral Neck Fx s/p Hip Arthroplasty 12/26, Hx of AAA, and other comorbids who presented to [name] from SNF with AMS and increased work of breathing. Per family patient at baseline has some dementia but is able to carry on a conversation to an extent. Patient's family noticed a decline yesterday morning and noticed that he was more confused than baseline. Work up performed at [facility] included a CXR which showed PNA and patient was started on Augmentin. Because of worsening confusion and Hypotension, he was sent to the ED for evaluation. Upon my evaluation patient is confused but is able to answer some questions. Denies any Pain. Patient's son is at bedside who states he has gotten worse. TRH was asked to admit for Sepsis and AMS.   ED Course: Sepsis Protocol was initiated. Patient had not had a Head CT Scan so I asked EDP to order one, was bolused 30 mg/kg, and had Blood Cx's and empiric Abx started.  Review of Systems: As per HPI otherwise 10 point review of systems negative.   Past Medical History:  Diagnosis Date  . AAA (abdominal aortic aneurysm) ([place])   . DJD (degenerative joint disease) 03/07/2012  . Fracture of femoral neck, right ([place]) 08/14/2017  . HTN (hypertension), benign 03/07/2012   Past Surgical History:  Procedure Laterality Date  . HIP ARTHROPLASTY Right 08/14/2017   Procedure: ARTHROPLASTY BIPOLAR HIP (HEMIARTHROPLASTY);  Surgeon: [name], MD;  Location: [facility];  Service: Orthopedics;  Laterality: Right;   SOCIAL HISTORY  reports that he quit smoking about 43 years ago. he has never used smokeless tobacco. He reports that he does not drink alcohol or use drugs.  ALLERGIES No  Known Allergies  FAMILY HISTORY Unable to obtain a subjective Hx from patient.   Prior to Admission medications   Medication Sig Start Date End Date Taking? Authorizing Provider  acetaminophen (TYLENOL) 325 MG tablet Take 2 tablets (650 mg total) by mouth every 6 (six) hours as needed. 08/14/17   [name], MD  amLODipine (NORVASC) 5 MG tablet Take 5 mg by mouth daily.    [provider]  Cholecalciferol (VITAMIN D) 2000 units CAPS Take 2,000 Units by mouth daily.    [provider]  cyanocobalamin 500 MCG tablet Take 1,000 mcg by mouth daily.     [provider]  enoxaparin (LOVENOX) 40 MG/0.4ML injection Inject 0.4 mLs (40 mg total) into the skin daily. 08/14/17   [name], MD  finasteride (PROSCAR) 5 MG tablet Take 5 mg by mouth daily.    [provider]  iron polysaccharides (FERREX 150) 150 MG capsule TAKE ONE CAPSULE BY MOUTH TWICE DAILY Patient not taking: Reported on 08/14/2017 08/31/13   [name], MD  Multiple Vitamins-Minerals (ICAPS AREDS 2) CAPS Take 1 Can by mouth 2 (two) times daily.    [provider]  sennosides-docusate sodium (SENOKOT-S) 8.6-50 MG tablet Take 2 tablets by mouth daily. 08/14/17   [name], MD  sodium chloride (OCEAN) 0.65 % SOLN nasal spray Place 1 spray into both nostrils as needed for congestion.    [provider]  [facility] Wort 150 MG CAPS Take 1 capsule by mouth daily.  [provider]   Physical Exam: Vitals:   09/04/17 1500 09/04/17 1515 09/04/17 1545 09/04/17 1600  BP: (!) 84/61  (!) 94/48 (!) 95/37  Pulse: 66 70 62 60  Resp: 20 (!) 26 (!) 24 (!) 23  Temp:      TempSrc:      SpO2: 98% 96% 98% 100%   Constitutional: Elderly Caucasian male in NAD and appears calm  Eyes: Lids and conjunctivae normal, sclerae anicteric; Pupils reactive  ENMT: External Ears, Nose appear normal. Grossly normal hearing. Mucous membranes are moist. Neck: Appears normal,  supple, no cervical masses, normal ROM, no appreciable thyromegaly; no appreciable JVD Respiratory: Diminished to auscultation bilaterally with no appreciable wheezing, rales, rhonchi or crackles. Slightly increased respiratory effort. No accessory muscle use.  Cardiovascular: RRR, no murmurs / rubs / gallops. S1 and S2 auscultated. 1-2+ LE extremity edema.  Abdomen: Soft, non-tender, non-distended. No masses palpated. No appreciable hepatosplenomegaly. Bowel sounds positive x4.  GU: Deferred. Musculoskeletal: No clubbing / cyanosis of digits/nails. No joint deformity upper and lower extremities. Skin: Has bilateral LE wounds/ulcers. No induration; Warm and dry.  Neurologic: Confused and follows some commands. Romberg sign cerebellar reflexes not assessed.  Psychiatric: Impaired judgment and insight. Awake but not alert. Normal mood and appropriate affect.   Labs on Admission: I have personally reviewed following labs and imaging studies  CBC: Recent Labs  Lab 09/04/17 1315  WBC 20.4*  NEUTROABS 18.5*  HGB 9.6*  HCT 30.2*  MCV 93.2  PLT 580   Basic Metabolic Panel: Recent Labs  Lab 09/04/17 1315  NA 146*  K 4.0  CL 115*  CO2 23  GLUCOSE 130*  BUN 53*  CREATININE 2.80*  CALCIUM 8.5*   GFR: CrCl cannot be calculated (Unknown ideal weight.). Liver Function Tests: Recent Labs  Lab 09/04/17 1315  AST 30  ALT 20  ALKPHOS 118  BILITOT 0.9  PROT 4.7*  ALBUMIN 1.9*   No results for input(s): LIPASE, AMYLASE in the last 168 hours. No results for input(s): AMMONIA in the last 168 hours. Coagulation Profile: Recent Labs  Lab 09/04/17 1315  INR 1.65   Cardiac Enzymes: No results for input(s): CKTOTAL, CKMB, CKMBINDEX, TROPONINI in the last 168 hours. BNP (last 3 results) No results for input(s): PROBNP in the last 8760 hours. HbA1C: No results for input(s): HGBA1C in the last 72 hours. CBG: Recent Labs  Lab 09/04/17 1345  GLUCAP 120*   Lipid Profile: No  results for input(s): CHOL, HDL, LDLCALC, TRIG, CHOLHDL, LDLDIRECT in the last 72 hours. Thyroid Function Tests: No results for input(s): TSH, T4TOTAL, FREET4, T3FREE, THYROIDAB in the last 72 hours. Anemia Panel: No results for input(s): VITAMINB12, FOLATE, FERRITIN, TIBC, IRON, RETICCTPCT in the last 72 hours. Urine analysis: No results found for: COLORURINE, APPEARANCEUR, LABSPEC, PHURINE, GLUCOSEU, HGBUR, BILIRUBINUR, KETONESUR, PROTEINUR, UROBILINOGEN, NITRITE, LEUKOCYTESUR Sepsis Labs: !!!!!!!!!!!!!!!!!!!!!!!!!!!!!!!!!!!!!!!!!!!! @LABRCNTIP (procalcitonin:4,lacticidven:4) )No results found for this or any previous visit (from the past 240 hour(s)).   Radiological Exams on Admission: Dg Chest 2 View  Result Date: 09/04/2017 CLINICAL DATA:  Shortness of breath. EXAM: CHEST  2 VIEW COMPARISON:  Radiographs of August 25, 2009. FINDINGS: Stable cardiomegaly. Atherosclerosis of thoracic aorta is noted. No pneumothorax is noted. No significant pleural effusion is noted. Bony thorax is unremarkable. IMPRESSION: Aortic atherosclerosis.  No acute cardiopulmonary abnormality seen. Electronically Signed   By: Marijo Conception, M.D.   On: 09/04/2017 15:21   EKG: Independently reviewed. Showed NSR at a rate of 70 with RBBB;  No appreciable ST Elevation on my interpretation.   Assessment/Plan Active Problems:   Sepsis ([place])  Sepsis from Unclear Etiology but suspect HCAP -Sepsis Protocol Initiated in ED -Admit to [facility] felt as if he had a LLL Opacity  -Patient was Hypotensive, has a Leukocytosis, Tachypenic and was diagnosed with a PNA at [facility] in [place] -Received 30 mL/kg Sepsis Protocol; Maintenance IVF with NS at 75 mL/hr -Check Procalcitonin and Trend Lactic Acid Levels -Broad Spectrum Abx with IV Vancomycin and IV Cefepime -Check Urinalysis and Urine Cx; Obtain Sputum Cx -Blood Cx x2 obtained in ED -Repeat CXR in AM   Acute Encephalopathy suspect Infectious  Etiology -Ordered Head CT w/o Contrast and showed 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 -I called [name] in Neurosurgery who states he will review the scan and make recommendations; He recommended no intervention at this time and does not feel as if Patient's Subdural Hygroma is the cause of patient's encephalopathy  -NPO until SLP -Treat Suspected Infectious Etiology with Broad Spectrum Abx -Seizure Precautions; Aspiration Precautions -Follow up on Cx's  -Patient is slightly Hypernatremic. Will give IVF Resuscitation and repeat in AM   Hypotension but Hx of Hypertension -Hold All Antihypertensives including Amlodipine and Proscar -C/w IVF Resuscitation   AKI on CKD Stage 3 -Baseline Cr was 1.4-1.5 -BUN/Cr on Admission showed patient to be 53/2.80 -? Obstruction from Acute Urinary Retention; Obtain Renal U/S -Bladder Scan showed 646 -Place Foley Catheter to Gravity -Avoid Nephrotoxic Medications -C/w Maintenance IVF Rehydration  -Repeat CMP in AM  Acute Urinary Retention -Bladder Scan showed 646 mL -Check Renal U/S -Place Foley Cathter -Finasteride held because of Hypotension   Recent Right Hip Femoral Neck Fx -S/p Right Hemiarthroplasty by [name] 12/26 -PT/OT  -C/w VTE Prophylaxis with Lovenox 40 mg sq daily  Bilateral Lower Extremity Swelling -Possibly due to poor Nutritional Status with low Albumin of 1.9 -Nutritionist Consulted -Check ECHOCardiogram -Check Urinalysis to r/o Nephrotic Syndrome   Bilateral LE Leg Wounds/Ulcers -WOC Nurse Consultation appreciated  Normocytic Anemia -Patient's Hb/Hct was 9.6/30.2 -Continue to Monitor for S/Sx of Bleeding and expect Dilutional Drop -Repeat CBC in AM  Hx of AAA -Last CT Abd/Pelvis 08/17/17 showed Ectatic Abdominal Aorta at risk for Aneurysm Development -Follow up as an outpatient    Leukocytosis -Patient's WBC was 20.4 -Likely infectious in origin; C/w IV Abx -Repeat CBC in AM   DVT prophylaxis: Enoxaparin 40 mg sq q24h Code Status: DO NOT RESUSCITATE Family Communication: Discussed with Son at bedside Disposition Plan: Anticipate D/C to SNF Consults called: None Admission status: Inpatient SDU  Severity of Illness: The appropriate patient status for this patient is INPATIENT. Inpatient status is judged to be reasonable and necessary in order to provide the required intensity of service to ensure the patient's safety. The patient's presenting symptoms, physical exam findings, and initial radiographic and laboratory data in the context of their chronic comorbidities is felt to place them at high risk for further clinical deterioration. Furthermore, it is not anticipated that the patient will be medically stable for discharge from the hospital within 2 midnights of admission. The following factors support the patient status of inpatient.   " The patient's presenting symptoms include AMS, confusion . " The worrisome physical exam findings include 2+ LE edema, Decreased mentation, and LE Pressure Ulcers. " The initial radiographic and laboratory data are worrisome because of Subdural Hematoma. "  The chronic co-morbidities include Hx of AAA; Hypertension, Recent Right Femoral Neck Fx.   * I certify that at the point of admission it is my clinical judgment that the patient will require inpatient hospital care spanning beyond 2 midnights from the point of admission due to high intensity of service, high risk for further deterioration and high frequency of surveillance required.Kerney Elbe, D.O. [facility] Pager [phone]  If 7PM-7AM, please contact night-coverage [email or web address] Password [facility]  09/04/2017, 4:15 PM

## 2017-09-04 NOTE — Progress Notes (Signed)
CRITICAL VALUE ALERT  Critical Value:  Lactic Acid 2.0  Date & Time Notied:  09/04/17 2242, troponin 0.23  Provider Notified: K.Kirby  Orders Received/Actions taken: No new orders

## 2017-09-04 NOTE — ED Provider Notes (Addendum)
[facility] EMERGENCY DEPARTMENT Provider Note   CSN: [id number] Arrival date & time: 09/04/17  1247     History   Chief Complaint Chief Complaint  Patient presents with  . Altered Mental Status    HPI Garrett Snow is a 84 y.o. male.  HPI   Level 5 caveat due to altered mental status.  Garrett Snow is a 84 y.o. male, with a history of AAA, hypertension, and right femoral neck fracture, presenting to the ED with altered mental status.  Patient's definite last seen normal 5 PM on January 14.  When family came to visit the patient at the nursing home yesterday morning they noted he was confused.  During the workup performed by nursing home patient, pneumonia was identified on chest x-ray. Patient's paperwork indicates he was prescribed Augmentin and received his first dose this morning.  He was sent to the ED today due to noted hypotension of 76/40 and confusion. Usually A&Ox4.  No reported vomiting, diarrhea, or fever.  Past Medical History:  Diagnosis Date  . AAA (abdominal aortic aneurysm) ([place])   . DJD (degenerative joint disease) 03/07/2012  . Fracture of femoral neck, right ([place]) 08/14/2017  . HTN (hypertension), benign 03/07/2012    Patient Active Problem List   Diagnosis Date Noted  . Sepsis ([place]) 09/04/2017  . Acute kidney injury superimposed on chronic kidney disease ([place]) 09/04/2017  . Acute urinary retention 09/04/2017  . Localized swelling of both lower legs 09/04/2017  . Normocytic anemia 09/04/2017  . Leukocytosis 09/04/2017  . Fracture of femoral neck, right ([place]) 08/14/2017  . Hip fracture ([place]) 08/14/2017  . AAA (abdominal aortic aneurysm) ([place])   . Stage III carcinoma of colon ([place]) 03/07/2012  . HTN (hypertension), benign 03/07/2012  . DJD (degenerative joint disease) 03/07/2012    Past Surgical History:  Procedure Laterality Date  . HIP ARTHROPLASTY Right 08/14/2017   Procedure: ARTHROPLASTY BIPOLAR HIP (HEMIARTHROPLASTY);   Surgeon: [name], MD;  Location: [facility];  Service: Orthopedics;  Laterality: Right;       Home Medications    Prior to Admission medications   Medication Sig Start Date End Date Taking? Authorizing Provider  acetaminophen (TYLENOL) 500 MG tablet Take 1,000 mg by mouth 2 (two) times daily.   Yes [provider]  amoxicillin-clavulanate (AUGMENTIN) 875-125 MG tablet Take 1 tablet by mouth 2 (two) times daily. 09/03/17 09/10/17 Yes [provider]  azithromycin (ZITHROMAX) 500 MG tablet Take 500 mg by mouth daily. 09/04/17 09/08/17 Yes [provider]  cholecalciferol (VITAMIN D) 1000 units tablet Take 2,000 Units by mouth daily.   Yes [provider]  Cholecalciferol (VITAMIN D) 2000 units CAPS Take 2,000 Units by mouth daily.   Yes [provider]  cyanocobalamin 500 MCG tablet Take 1,000 mcg by mouth daily.    Yes [provider]  enoxaparin (LOVENOX) 40 MG/0.4ML injection Inject 0.4 mLs (40 mg total) into the skin daily. 08/14/17  Yes [name], MD  finasteride (PROSCAR) 5 MG tablet Take 5 mg by mouth daily.   Yes [provider]  furosemide (LASIX) 40 MG tablet Take 40 mg by mouth daily.   Yes [provider]  iron polysaccharides (FERREX 150) 150 MG capsule TAKE ONE CAPSULE BY MOUTH TWICE DAILY 08/31/13  Yes [name], MD  Multiple Vitamins-Minerals (ICAPS AREDS 2) CAPS Take 2 capsules by mouth 2 (two) times daily.    Yes [provider]  sennosides-docusate sodium (SENOKOT-S) 8.6-50 MG  tablet Take 2 tablets by mouth daily. 08/14/17  Yes [name], MD  sodium chloride (OCEAN) 0.65 % SOLN nasal spray Place 1 spray into both nostrils as needed for congestion.   Yes [provider]  [facility] Wort 150 MG CAPS Take 1 capsule by mouth daily.   Yes [provider]  traMADol (ULTRAM) 50 MG tablet Take 50 mg by mouth every 6 (six) hours as needed for moderate pain.   Yes  [provider]  valACYclovir (VALTREX) 1000 MG tablet Take 1,000 mg by mouth 3 (three) times daily.   Yes [provider]    Family History No family history on file.  Social History Social History   Tobacco Use  . Smoking status: Former Smoker    Last attempt to quit: 08/19/1974    Years since quitting: 43.0  . Smokeless tobacco: Never Used  Substance Use Topics  . Alcohol use: No    Alcohol/week: 0.0 oz    Frequency: Never  . Drug use: No     Allergies   Patient has no known allergies.   Review of Systems Review of Systems  Unable to perform ROS: Mental status change  Psychiatric/Behavioral: Positive for confusion.     Physical Exam Updated Vital Signs BP (!) 89/40   Pulse 72   Temp 98.2 F (36.8 C) (Oral)   Resp (!) 21   SpO2 97%   Physical Exam  Constitutional: He appears well-developed and well-nourished. No distress.  HENT:  Head: Normocephalic and atraumatic.  Eyes: Conjunctivae are normal. Pupils are equal, round, and reactive to light.  Neck: Neck supple.  Cardiovascular: Normal rate, regular rhythm, normal heart sounds and intact distal pulses.  Pulmonary/Chest: Effort normal and breath sounds normal. No respiratory distress.  Abdominal: Soft. There is no tenderness. There is no guarding.  Musculoskeletal: He exhibits edema.  Bilateral lower extremity edema present.  Lymphadenopathy:    He has no cervical adenopathy.  Neurological: He is alert.  Patient is oriented to self only. Patient appears to be handling secretions without difficulty. No noted facial droop. Grip strength equal bilaterally. Sensation appears to be intact in the extremities. Motor function appears to be intact in the extremities.  Skin: Skin is warm and dry. He is not diaphoretic.  Patient was noted to have pressure ulcers as shown in the pictures. There is also an area of possible decreased circulation to the left big toe.  Psychiatric: He has a normal  mood and affect. His behavior is normal.  Nursing note and vitals reviewed.   Right heel:     Left heel:       ED Treatments / Results  Labs (all labs ordered are listed, but only abnormal results are displayed) Labs Reviewed  COMPREHENSIVE METABOLIC PANEL - Abnormal; Notable for the following components:      Result Value   Sodium 146 (*)    Chloride 115 (*)    Glucose, Bld 130 (*)    BUN 53 (*)    Creatinine, Ser 2.80 (*)    Calcium 8.5 (*)    Total Protein 4.7 (*)    Albumin 1.9 (*)    GFR calc non Af Amer 18 (*)    GFR calc Af Amer 20 (*)    All other components within normal limits  CBC WITH DIFFERENTIAL/PLATELET - Abnormal; Notable for the following components:   WBC 20.4 (*)    RBC 3.24 (*)    Hemoglobin 9.6 (*)  HCT 30.2 (*)    RDW 15.8 (*)    Neutro Abs 18.5 (*)    All other components within normal limits  PROTIME-INR - Abnormal; Notable for the following components:   Prothrombin Time 19.4 (*)    All other components within normal limits  CBG MONITORING, ED - Abnormal; Notable for the following components:   Glucose-Capillary 120 (*)    All other components within normal limits  CULTURE, BLOOD (ROUTINE X 2)  CULTURE, BLOOD (ROUTINE X 2)  URINALYSIS, ROUTINE W REFLEX MICROSCOPIC  I-STAT CG4 LACTIC ACID, ED  I-STAT CG4 LACTIC ACID, ED  CBG MONITORING, ED    EKG  EKG Interpretation  Date/Time:  Wednesday September 04 2017 14:12:19 EST Ventricular Rate:  70 PR Interval:    QRS Duration: 159 QT Interval:  478 QTC Calculation: 516 R Axis:   -52 Text Interpretation:  Sinus arrhythmia RBBB and LAFB Probable LVH with secondary repol abnrm Confirmed by Lacretia Leigh ([id number]) on 09/04/2017 2:49:22 PM       Radiology Dg Chest 2 View  Result Date: 09/04/2017 CLINICAL DATA:  Shortness of breath. EXAM: CHEST  2 VIEW COMPARISON:  Radiographs of August 25, 2009. FINDINGS: Stable cardiomegaly. Atherosclerosis of thoracic aorta is noted. No pneumothorax  is noted. No significant pleural effusion is noted. Bony thorax is unremarkable. IMPRESSION: Aortic atherosclerosis.  No acute cardiopulmonary abnormality seen. Electronically Signed   By: Marijo Conception, M.D.   On: 09/04/2017 15:21   Ct Head Wo Contrast  Result Date: 09/04/2017 CLINICAL DATA:  Altered mental status EXAM: CT HEAD WITHOUT CONTRAST TECHNIQUE: Contiguous axial images were obtained from the base of the skull through the vertex without intravenous contrast. COMPARISON:  None. FINDINGS: Brain: Predominantly low attenuation collection overlying the right convexity measures up to 10 mm in thickness. There is no associated midline shift or other mass effect. There is periventricular hypoattenuation compatible with chronic microvascular disease. Vascular: No hyperdense vessel or unexpected calcification. Skull: Normal visualized skull base, calvarium and extracranial soft tissues. Sinuses/Orbits: No sinus fluid levels or advanced mucosal thickening. No mastoid effusion. Normal orbits. 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 Electronically Signed   By: Ulyses Jarred M.D.   On: 09/04/2017 16:32    Procedures .Critical Care Performed by: Lorayne Bender, PA-C Authorized by: Lorayne Bender, PA-C   Critical care provider statement:    Critical care time (minutes):  35   Critical care time was exclusive of:  Separately billable procedures and treating other patients   Critical care was necessary to treat or prevent imminent or life-threatening deterioration of the following conditions:  Sepsis   Critical care was time spent personally by me on the following activities:  Evaluation of patient's response to treatment, discussions with consultants, development of treatment plan with patient or surrogate, examination of patient, obtaining history from patient or surrogate, review of old  charts, re-evaluation of patient's condition, pulse oximetry, ordering and review of radiographic studies, ordering and review of laboratory studies and ordering and performing treatments and interventions   I assumed direction of critical care for this patient from another provider in my specialty: no     (including critical care time)  Medications Ordered in ED Medications  vancomycin (VANCOCIN) 1,500 mg in sodium chloride 0.9 % 500 mL IVPB (1,500 mg Intravenous New Bag/Given 09/04/17 1558)  vancomycin (VANCOCIN) IVPB 1000 mg/200 mL premix (not administered)  ceFEPIme (MAXIPIME) 1 g in dextrose 5 % 50 mL IVPB (not administered)  sodium chloride 0.9 % bolus 1,000 mL (0 mLs Intravenous Stopped 09/04/17 1453)  sodium chloride 0.9 % bolus 1,000 mL (0 mLs Intravenous Stopped 09/04/17 1547)    And  sodium chloride 0.9 % bolus 500 mL (0 mLs Intravenous Stopped 09/04/17 1713)  ceFEPIme (MAXIPIME) 2 g in dextrose 5 % 50 mL IVPB (0 g Intravenous Stopped 09/04/17 1713)     Initial Impression / Assessment and Plan / ED Course  I have reviewed the triage vital signs and the nursing notes.  Pertinent labs & imaging results that were available during my care of the patient were reviewed by me and considered in my medical decision making (see chart for details).  Clinical Course as of Sep 05 1751  Wed Sep 04, 2017  [place] Spoke with Dr. Alfredia Ferguson, hospitalist. Agrees to admit the patient.  [SJ]  [place] with [name], neurosurgeon.  Reviewed the CT and agrees the hematoma appears chronic and does not think it is the cause of the patient's current presentation. There is no treatment that needs to be done from a neurosurgical standpoint.   [SJ]    Clinical Course User Index [SJ] Asa Fath C, PA-C    Patient presents with altered mental status, hypotension, and acute kidney injury.  Patient reported to have a left lower lobe opacity suspicious for pneumonia noted on outside x-ray report.  This outside  diagnosis was acted upon while the rest of the patient's workup was ongoing.  Leukocytosis, hypotension, and multiple other sources possible in addition to the pneumonia, including pressure ulcers. Other diagnoses are still on the differential for patient's altered mental status.  Patient noted to be retaining urine with bladder volume of 640 mL, elevated creatinine, and elevated BUN.  UA in process at time of admission.  Findings and plan of care discussed with Lacretia Leigh, MD. Dr. Zenia Resides personally evaluated and examined this patient.  Final Clinical Impressions(s) / ED Diagnoses   Final diagnoses:  Altered mental status, unspecified altered mental status type    ED Discharge Orders    None       Layla Maw 09/04/17 1754   Following inquiry by the medical coding company, I noted that this patient received critical care services provided by myself and the attending physician. The note was addended to reflect these services provided during the patient's ED visit.    Lorayne Bender, PA-C 09/16/17 1018    Lacretia Leigh, MD 09/16/17 [phone]

## 2017-09-04 NOTE — ED Notes (Signed)
CBG taken at 21:03 was 114.

## 2017-09-04 NOTE — ED Notes (Signed)
Bladder scan 646 in the bladder.

## 2017-09-04 NOTE — ED Notes (Signed)
Pt taken to CT at this time.

## 2017-09-04 NOTE — ED Notes (Signed)
Condom cath placed by this nurse and Juanda Crumble, Ed tech.

## 2017-09-04 NOTE — Progress Notes (Signed)
Pharmacy Antibiotic Note  Garrett Snow is a 84 y.o. male admitted on 09/04/2017 with sepsis 2/2 pneumonia.  Pharmacy has been consulted for vancomycin and cefepime dosing. Pt is afebrile. Elevated Scr at 2.8 (Calculated CrCl 16.5 ml/min). Elevated WBC at 20.4. Wt 75.4kg per note from 08/14/17.  Plan: Vancomycin 1500mg  IV x1 Vancomycin 1000mg  IV q48h. Goal trough 15-20 mcg/mL Cefepime 2gm IV x1 Cefepime 1gm IV q24 F/u renal fxn, trough @ SS, clinical resolution  Temp (24hrs), Avg:98.1 F (36.7 C), Min:98 F (36.7 C), Max:98.2 F (36.8 C)  Recent Labs  Lab 09/04/17 1315 09/04/17 1325  WBC 20.4*  --   CREATININE 2.80*  --   LATICACIDVEN  --  1.62    CrCl cannot be calculated (Unknown ideal weight.).    No Known Allergies  Antimicrobials this admission: Vanc 1/16 >>  Cefepime 1/16 >>   Microbiology results: Pending  Thank you for allowing pharmacy to be a part of this patient's care.  Numa Schroeter 09/04/2017 3:05 PM

## 2017-09-04 NOTE — ED Provider Notes (Signed)
Medical screening examination/treatment/procedure(s) were conducted as a shared visit with non-physician practitioner(s) and myself.  I personally evaluated the patient during the encounter.   EKG Interpretation  Date/Time:  Wednesday September 04 2017 14:12:19 EST Ventricular Rate:  70 PR Interval:    QRS Duration: 159 QT Interval:  478 QTC Calculation: 516 R Axis:   -52 Text Interpretation:  Sinus arrhythmia RBBB and LAFB Probable LVH with secondary repol abnrm Confirmed by Lacretia Leigh ([id number]) on 09/04/2017 2:49:2 PM     84 year old male from nursing home with decreased level of consciousness.  Patient currently being treated for pneumonia.  Concern for possible sepsis and patient started on IV antibiotics as well as IV fluids.  Does have evidence of acute kidney injury.  Will admit to hospitalist service   Lacretia Leigh, MD 09/04/17 [phone]

## 2017-09-04 NOTE — ED Triage Notes (Signed)
PT brought from [place] for decreased LOC. PT was admitted to facility 08/17/17 following hospital admission for right femur fracture. PT was diagnosed with pneumonia yesterday. PT also has shingles. Nursing facility reports they obtained a BP of 76/40. EMS gave 350 cc NS and BP was 94/56.  PT oriented to self only. EMS was told that PT was at mental baseline, but has decreased LOC this morning.

## 2017-09-04 NOTE — ED Notes (Signed)
Patient transported to X-ray. Dr. Zenia Resides approves PT transport. PT transported on monitor.

## 2017-09-05 ENCOUNTER — Inpatient Hospital Stay (HOSPITAL_COMMUNITY): Payer: Medicare Other

## 2017-09-05 DIAGNOSIS — E43 Unspecified severe protein-calorie malnutrition: Secondary | ICD-10-CM

## 2017-09-05 DIAGNOSIS — I340 Nonrheumatic mitral (valve) insufficiency: Secondary | ICD-10-CM

## 2017-09-05 DIAGNOSIS — L8990 Pressure ulcer of unspecified site, unspecified stage: Secondary | ICD-10-CM

## 2017-09-05 DIAGNOSIS — M7989 Other specified soft tissue disorders: Secondary | ICD-10-CM

## 2017-09-05 LAB — COMPREHENSIVE METABOLIC PANEL
ALBUMIN: 1.8 g/dL — AB (ref 3.5–5.0)
ALK PHOS: 100 U/L (ref 38–126)
ALK PHOS: 99 U/L (ref 38–126)
ALT: 18 U/L (ref 17–63)
ALT: 16 U/L — ABNORMAL LOW (ref 17–63)
ANION GAP: 10 (ref 5–15)
AST: 23 U/L (ref 15–41)
AST: 21 U/L (ref 15–41)
Albumin: 1.7 g/dL — ABNORMAL LOW (ref 3.5–5.0)
Anion gap: 9 (ref 5–15)
BILIRUBIN TOTAL: 0.9 mg/dL (ref 0.3–1.2)
BILIRUBIN TOTAL: 1.1 mg/dL (ref 0.3–1.2)
BUN: 52 mg/dL — ABNORMAL HIGH (ref 6–20)
BUN: 55 mg/dL — AB (ref 6–20)
CALCIUM: 8.2 mg/dL — AB (ref 8.9–10.3)
CALCIUM: 8.5 mg/dL — AB (ref 8.9–10.3)
CO2: 20 mmol/L — ABNORMAL LOW (ref 22–32)
CO2: 21 mmol/L — ABNORMAL LOW (ref 22–32)
Chloride: 117 mmol/L — ABNORMAL HIGH (ref 101–111)
Chloride: 118 mmol/L — ABNORMAL HIGH (ref 101–111)
Creatinine, Ser: 2.32 mg/dL — ABNORMAL HIGH (ref 0.61–1.24)
Creatinine, Ser: 2.21 mg/dL — ABNORMAL HIGH (ref 0.61–1.24)
GFR calc Af Amer: 27 mL/min — ABNORMAL LOW (ref >60)
GFR calc non Af Amer: 22 mL/min — ABNORMAL LOW (ref >60)
GFR calc non Af Amer: 23 mL/min — ABNORMAL LOW (ref >60)
GFR, EST AFRICAN AMERICAN: 26 mL/min — AB (ref >60)
GLUCOSE: 93 mg/dL (ref 65–99)
Glucose, Bld: 99 mg/dL (ref 65–99)
POTASSIUM: 3.6 mmol/L (ref 3.5–5.1)
Potassium: 3.7 mmol/L (ref 3.5–5.1)
Sodium: 147 mmol/L — ABNORMAL HIGH (ref 135–145)
Sodium: 148 mmol/L — ABNORMAL HIGH (ref 135–145)
TOTAL PROTEIN: 4.2 g/dL — AB (ref 6.5–8.1)
TOTAL PROTEIN: 4.4 g/dL — AB (ref 6.5–8.1)

## 2017-09-05 LAB — URINE CULTURE: Culture: NO GROWTH

## 2017-09-05 LAB — CBC WITH DIFFERENTIAL/PLATELET
BASOS PCT: 0 %
Basophils Absolute: 0 10*3/uL (ref 0.0–0.1)
EOS ABS: 0 10*3/uL (ref 0.0–0.7)
Eosinophils Relative: 0 %
HCT: 27 % — ABNORMAL LOW (ref 39.0–52.0)
HEMOGLOBIN: 8.8 g/dL — AB (ref 13.0–17.0)
LYMPHS ABS: 1.2 10*3/uL (ref 0.7–4.0)
Lymphocytes Relative: 8 %
MCH: 30 pg (ref 26.0–34.0)
MCHC: 32.6 g/dL (ref 30.0–36.0)
MCV: 92.2 fL (ref 78.0–100.0)
MONO ABS: 0.5 10*3/uL (ref 0.1–1.0)
MONOS PCT: 4 %
NEUTROS PCT: 88 %
Neutro Abs: 12.6 10*3/uL — ABNORMAL HIGH (ref 1.7–7.7)
Platelets: 171 10*3/uL (ref 150–400)
RBC: 2.93 MIL/uL — ABNORMAL LOW (ref 4.22–5.81)
RDW: 15.9 % — AB (ref 11.5–15.5)
WBC: 14.3 10*3/uL — ABNORMAL HIGH (ref 4.0–10.5)

## 2017-09-05 LAB — MRSA PCR SCREENING: MRSA by PCR: NEGATIVE

## 2017-09-05 LAB — CBC
HCT: 28.5 % — ABNORMAL LOW (ref 39.0–52.0)
Hemoglobin: 9 g/dL — ABNORMAL LOW (ref 13.0–17.0)
MCH: 29.2 pg (ref 26.0–34.0)
MCHC: 31.6 g/dL (ref 30.0–36.0)
MCV: 92.5 fL (ref 78.0–100.0)
Platelets: 177 10*3/uL (ref 150–400)
RBC: 3.08 MIL/uL — ABNORMAL LOW (ref 4.22–5.81)
RDW: 15.7 % — ABNORMAL HIGH (ref 11.5–15.5)
WBC: 14.3 10*3/uL — ABNORMAL HIGH (ref 4.0–10.5)

## 2017-09-05 LAB — ECHOCARDIOGRAM COMPLETE
HEIGHTINCHES: 72 in
WEIGHTICAEL: 2694.9 [oz_av]

## 2017-09-05 LAB — TROPONIN I
TROPONIN I: 0.2 ng/mL — AB (ref <0.03)
TROPONIN I: 0.15 ng/mL — AB (ref <0.03)

## 2017-09-05 LAB — PHOSPHORUS: PHOSPHORUS: 3.8 mg/dL (ref 2.5–4.6)

## 2017-09-05 LAB — MAGNESIUM: Magnesium: 1.9 mg/dL (ref 1.7–2.4)

## 2017-09-05 MED ORDER — POTASSIUM CL IN DEXTROSE 5% 20 MEQ/L IV SOLN
20.0000 meq | INTRAVENOUS | Status: DC
  Administered 2017-09-05: 20 meq via INTRAVENOUS
  Filled 2017-09-05 (×2): qty 1000

## 2017-09-05 MED ORDER — ADULT MULTIVITAMIN W/MINERALS CH
1.0000 | ORAL_TABLET | Freq: Every day | ORAL | Status: DC
  Administered 2017-09-05 – 2017-09-15 (×11): 1 via ORAL
  Filled 2017-09-05 (×11): qty 1

## 2017-09-05 MED ORDER — PRO-STAT SUGAR FREE PO LIQD
30.0000 mL | Freq: Two times a day (BID) | ORAL | Status: DC
  Administered 2017-09-05 – 2017-09-15 (×20): 30 mL via ORAL
  Filled 2017-09-05 (×20): qty 30

## 2017-09-05 MED ORDER — KCL IN DEXTROSE-NACL 20-5-0.45 MEQ/L-%-% IV SOLN
INTRAVENOUS | Status: DC
  Administered 2017-09-05: 08:00:00 via INTRAVENOUS
  Filled 2017-09-05: qty 1000

## 2017-09-05 MED ORDER — CHLORHEXIDINE GLUCONATE 0.12 % MT SOLN
15.0000 mL | Freq: Two times a day (BID) | OROMUCOSAL | Status: DC
  Administered 2017-09-05 – 2017-09-15 (×20): 15 mL via OROMUCOSAL
  Filled 2017-09-05 (×19): qty 15

## 2017-09-05 MED ORDER — COLLAGENASE 250 UNIT/GM EX OINT
TOPICAL_OINTMENT | Freq: Every day | CUTANEOUS | Status: DC
  Administered 2017-09-05 – 2017-09-15 (×11): via TOPICAL
  Filled 2017-09-05 (×2): qty 30

## 2017-09-05 MED ORDER — ENSURE ENLIVE PO LIQD
237.0000 mL | Freq: Two times a day (BID) | ORAL | Status: DC
  Administered 2017-09-05 – 2017-09-15 (×18): 237 mL via ORAL

## 2017-09-05 MED ORDER — ORAL CARE MOUTH RINSE
15.0000 mL | Freq: Two times a day (BID) | OROMUCOSAL | Status: DC
  Administered 2017-09-05 – 2017-09-15 (×17): 15 mL via OROMUCOSAL

## 2017-09-05 NOTE — Evaluation (Signed)
Clinical/Bedside Swallow Evaluation Patient Details  Name: Garrett Snow MRN: [id number] Date of Birth: 1918/10/28  Today's Date: 09/05/2017 Time: SLP Start Time (ACUTE ONLY): 4259 SLP Stop Time (ACUTE ONLY): 0928 SLP Time Calculation (min) (ACUTE ONLY): 35 min  Past Medical History:  Past Medical History:  Diagnosis Date  . AAA (abdominal aortic aneurysm) ([place])   . DJD (degenerative joint disease) 03/07/2012  . Fracture of femoral neck, right ([place]) 08/14/2017  . HTN (hypertension), benign 03/07/2012   Past Surgical History:  Past Surgical History:  Procedure Laterality Date  . HIP ARTHROPLASTY Right 08/14/2017   Procedure: ARTHROPLASTY BIPOLAR HIP (HEMIARTHROPLASTY);  Surgeon: [name], MD;  Location: [facility];  Service: Orthopedics;  Laterality: Right;   HPI:  Garrett P Smootis a 84 y.o.malewith medical history significant ofHTN, DJD, dementia, Recent Right Femoral Neck Fx s/p Hip Arthroplasty 12/26, Hx of AAA who presented to [name] from SNF with AMS andincreased work of breathing. Initial CXR showed PNA and patient was started on Augmentin at SNF. Admitted for sepsis and AMS.CXR 1/16 aortic atherosclerosis. No acute cardiopulmonary abnormality seen. Head CT subacute to chronic right convexity subdural hematoma, measuring up to 10 mm in thickness without associated midline shift or other mass effect.    Assessment / Plan / Recommendation Clinical Impression  Oral cavity examination revealed material in upper and bilateral hard and primarily soft palate (blood and mucous?) removed utilizing toothbrush, suction toothbrush and Yankeurs. Upper denture plate ill fitting (does not use adhesive) with residue from graham cracker removed. Initially resistant to remove upper denture plate and asked SLP to remove them at end of assessment. Prolonged mastication and transit with solid. No overt indications of aspiration; audible swallow. Aspiration risk is higher given subacute/chronic  hematoma on head CT. Recommend initiate Dys 1 (puree), thin liquids, swallow precautions, small sips with straw and meds w hole in puree. Will follow for ability to upgrade solids.      SLP Visit Diagnosis: Dysphagia, oral phase (R13.11)    Aspiration Risk  (mild-mod)    Diet Recommendation Dysphagia 1 (Puree);Thin liquid   Liquid Administration via: Straw;Cup Medication Administration: Whole meds with puree Supervision: Full supervision/cueing for compensatory strategies Compensations: Small sips/bites;Slow rate;Lingual sweep for clearance of pocketing Postural Changes: Seated upright at 90 degrees    Other  Recommendations Oral Care Recommendations: Oral care BID   Follow up Recommendations None      Frequency and Duration min 2x/week  2 weeks       Prognosis Prognosis for Safe Diet Advancement: Fair      Swallow Study   General HPI: Garrett P Smootis a 84 y.o.malewith medical history significant ofHTN, DJD, dementia, Recent Right Femoral Neck Fx s/p Hip Arthroplasty 12/26, Hx of AAA who presented to [name] from SNF with AMS andincreased work of breathing. Initial CXR showed PNA and patient was started on Augmentin at SNF. Admitted for sepsis and AMS.CXR 1/16 aortic atherosclerosis. No acute cardiopulmonary abnormality seen. Head CT subacute to chronic right convexity subdural hematoma, measuring up to 10 mm in thickness without associated midline shift or other mass effect.  Type of Study: Bedside Swallow Evaluation Previous Swallow Assessment: (none) Diet Prior to this Study: NPO Temperature Spikes Noted: No Respiratory Status: Room air History of Recent Intubation: No Behavior/Cognition: Cooperative;Alert;Pleasant mood;Requires cueing Oral Cavity Assessment: Dried secretions;Other (comment)(dark brown material (dried blood?) posterior pharynx) Oral Care Completed by SLP: Yes Oral Cavity - Dentition: Dentures, bottom;Dentures, top Vision: Functional for  self-feeding Self-Feeding Abilities: Able  to feed self Patient Positioning: Upright in bed Baseline Vocal Quality: Normal Volitional Cough: Strong Volitional Swallow: Able to elicit    Oral/Motor/Sensory Function Overall Oral Motor/Sensory Function: Within functional limits   Ice Chips Ice chips: Within functional limits   Thin Liquid Thin Liquid: Impaired Presentation: Cup;Straw Pharyngeal  Phase Impairments: Other (comments);Suspected delayed Swallow(audible swallow)    Nectar Thick Nectar Thick Liquid: Not tested   Honey Thick Honey Thick Liquid: Not tested   Puree Puree: Within functional limits   Solid   GO   Solid: Impaired Oral Phase Functional Implications: Oral residue;Prolonged oral transit        Garrett Snow 09/05/2017,12:20 PM  Garrett Snow.Ed [employer] [phone]

## 2017-09-05 NOTE — Progress Notes (Signed)
PROGRESS NOTE    Garrett Snow  WUJ:[id number] DOB: [birth year] DOA: 09/04/2017 PCP: Garrett Snow., MD   Brief Narrative:  Garrett Snow is a 84 y.o. male with medical history significant of HTN, DJD, Recent Right Femoral Neck Fx s/p Hip Arthroplasty 12/26, Hx of AAA, and other comorbids who presented to [facility] from SNF with AMS and increased work of breathing. Per family patient at baseline has some dementia but is able to carry on a conversation to an extent. Patient's family noticed a decline yesterday morning and noticed that he was more confused than baseline. Work up performed at [facility] included a CXR which showed PNA and patient was started on Augmentin. Because of worsening confusion and Hypotension, he was sent to the ED for evaluation. Upon my evaluation patient is confused but is able to answer some questions. Denies any Pain. Patient's son is at bedside who states he has gotten worse. TRH was asked to admit for Sepsis and AMS. Patient is improving slightly and was more alert and responsive today.   Assessment & Plan:   Active Problems:   AAA (abdominal aortic aneurysm) (HCC)   Fracture of femoral neck, right (HCC)   Hip fracture (HCC)   Sepsis (HCC)   Acute kidney injury superimposed on chronic kidney disease (HCC)   Acute urinary retention   Localized swelling of both lower legs   Normocytic anemia   Leukocytosis  Sepsis from Unclear Etiology but suspect HCAP -Sepsis Protocol Initiated in ED -Admitted to [facility] felt as if he had a LLL Opacity  -Patient was Hypotensive, has a Leukocytosis, Tachypenic and was diagnosed with a PNA at [facility] in [place] -Received 30 mL/kg Sepsis Protocol; Maintenance IVF with NS at 75 mL/hr; Changed to D5W today given Hypernatremia and Hypercholremia  -Checked Procalcitonin and was 55.53 and Trend Lactic Acid Levels and last was 2.0 -C/w Broad Spectrum Abx with IV Vancomycin and IV Cefepime -Checked Urinalysis and was  Negative and Urine Cx showed No Growth (but was on Abx); Obtain Sputum Cx -Blood Cx x2 obtained in ED show NGTD at 1 day -CXR yesterday showed Aortic atherosclerosis.  No acute cardiopulmonary abnormality seen. -Repeat CXR in AM   Acute Encephalopathy suspect Infectious Etiology, improved -Ordered Head CT w/o Contrast and showed 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 -I called Garrett Snow in Neurosurgery who states he will review the scan and make recommendations; He recommended no intervention at this time and does not feel as if Patient's Subdural Hygroma is the cause of patient's encephalopathy  -NPO until SLP; SLP recommending Dysphagia 1 Puree Thin Liquids  -Treat Suspected Infectious Etiology with Broad Spectrum Abx -Seizure Precautions; Aspiration Precautions -Follow up on Cx's  -Patient is slightly Hypernatremic. Will give IVF Resuscitation and change to D5W and repeat in AM   Hypotension but Hx of Hypertension -Hold All Antihypertensives including Amlodipine and Proscar -C/w IVF Resuscitation as above  AKI on CKD Stage 3 -Baseline Cr was 1.4-1.5 -BUN/Cr on Admission showed patient to be 53/2.80; Now improved to 55/2.21 -? Obstruction from Acute Urinary Retention;  -Obtained Renal U/S and showed No hydronephrosis. Echogenic mildly atrophic kidneys, compatible with acute on chronic nonspecific renal parenchymal disease. Simple bilateral renal cysts. Foley catheter in place within the minimally distended urinary bladder. 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 Of IVC Filter -Discussed Case with Garrett Snow who recommended no IVC filter because above the Renal Vein; Per Garrett Snow no appropriate imaging can be done as cannot use Contrast due to patient's Kidney Function. After discussion with Garrett Snow and review of the films, he cannot exclude tumor -For now we will just monitor the patient and discuss with his family about findings   Recent Right Hip Femoral Neck Fx -S/p Right Hemiarthroplasty by [name] 12/26 -PT/OT  -C/w VTE Prophylaxis with Lovenox 30 mg sq daily  Bilateral Lower Extremity Swelling -Possibly due to poor Nutritional Status with low Albumin of 1.8 -Nutritionist Consulted -Checked ECHOCardiogram and EF was 60-65% and had Grade 1 DD -LE Duplex Negative for DVT -Checked Urinalysis to r/o Nephrotic Syndrome and had no Protein   Bilateral LE Leg Wounds/Ulcers -WOC Nurse Consultation appreciated -Recommendations per [place] Nurse note  Normocytic Anemia -Patient's Hb/Hct was 9.6/30.2 and went to 9.0/28.5 -Continue to Monitor for S/Sx of Bleeding and expect Dilutional Drop -Repeat CBC in AM  Hx of AAA -Last CT Abd/Pelvis 08/17/17 showed Ectatic Abdominal Aorta at risk for Aneurysm Development -Follow up as an outpatient   Leukocytosis -Patient's WBC was 20.4 and improved to 14.3 -Likely infectious in origin; C/w IV Abx -Repeat CBC in AM   Hypernatremia/Hypercholremia -Patient's Na+ was  148 and Chloride was 118 -Started D5W at 75 mL/hr -Repeat CMP in AM  DVT prophylaxis: Enoxaparin 30 mg sq q24h Code Status: DO NOT RESUSCITATE Family Communication: No family present at bedside Disposition Plan: [place] for further evaluation and workup  Consultants:   Discussed Case with Vascular Surgery Garrett Snow  Discussed Case with IR [facility] Snow   Procedures:  ECHOCARDIOGRAM ------------------------------------------------------------------- Study Conclusions  - Left ventricle: The cavity size was normal. There was moderate   concentric hypertrophy. Systolic function was normal. The   estimated ejection fraction was in the range of 60% to 65%. Wall   motion was normal; there were no regional wall motion   abnormalities. Doppler parameters are consistent with abnormal   left ventricular relaxation (grade 1 diastolic dysfunction). - Aortic valve: There was no regurgitation. - Mitral valve: Calcified annulus. Mildly thickened leaflets .   There was moderate regurgitation. - Left atrium: The atrium was moderately dilated. - Right ventricle: The cavity size was normal. Wall thickness was   normal. Systolic function was normal. - Right atrium: The atrium was normal in size. - Tricuspid valve: There was mild regurgitation. - Pulmonary arteries: Systolic pressure was moderately increased.   PA peak pressure: 46 mm Hg (S). - Inferior vena cava: The vessel was normal in size.  LOWER EXTREMITY DUPLEX Negative for DVT.  Antimicrobials:  Anti-infectives (From admission, onward)   Start     Dose/Rate Route Frequency Ordered Stop   09/06/17  1600  vancomycin (VANCOCIN) IVPB 1000 mg/200 mL premix     1,000 mg 200 mL/hr over 60 Minutes Intravenous Every 48 hours 09/04/17 1528     09/05/17 1600  ceFEPIme (MAXIPIME) 1 g in dextrose 5 % 50 mL IVPB     1 g 100 mL/hr over 30 Minutes Intravenous Every 24 hours 09/04/17 1528     09/04/17 1530  vancomycin (VANCOCIN) 1,500 mg  in sodium chloride 0.9 % 500 mL IVPB     1,500 mg 250 mL/hr over 120 Minutes Intravenous STAT 09/04/17 1528 09/04/17 1911   09/04/17 1530  ceFEPIme (MAXIPIME) 2 g in dextrose 5 % 50 mL IVPB     2 g 100 mL/hr over 30 Minutes Intravenous STAT 09/04/17 1528 09/04/17 1713     Subjective: Seen and examined at bedside and was more alert and awake. Stated he sometimes feels hungry but didn't feel hungry today. Feels slightly better. No nausea or vomiting. Stated he felt cold. No other concerns or complaints at this time.   Objective: Vitals:   09/04/17 2330 09/05/17 0100 09/05/17 0200 09/05/17 0309  BP: (!) 92/46 (!) 94/49 (!) 95/46 (!) 106/52  Pulse: 69 71 67 68  Resp: (!) 24 (!) 23 (!) 25 (!) 25  Temp:    98.6 F (37 C)  TempSrc:    Axillary  SpO2: 100% 96% 99% 100%  Weight:    76.4 kg (168 lb 6.9 oz)  Height:        Intake/Output Summary (Last 24 hours) at 09/05/2017 0736 Last data filed at 09/05/2017 0500 Gross per 24 hour  Intake 2555 ml  Output 950 ml  Net 1605 ml   Filed Weights   09/04/17 2206 09/05/17 0309  Weight: 73.9 kg (162 lb 14.7 oz) 76.4 kg (168 lb 6.9 oz)   Examination: Physical Exam:  Constitutional: Elderly Caucasian male who is more alert and appears calm and in NAD Eyes: Sclerae anicteric. Lids normal ENMT: External Ears and nose appear normal. Neck: Appears supple with no JVD Respiratory: Diminished to auscultation. Unlabored Breathing Cardiovascular: RRR; Has 1-2+ LE Edema Abdomen: Soft, NT, ND. Bowel sounds present GU: Deferred. Has foley catheter in place and draining yellow colored urine Musculoskeletal: No contractures; No cyanosis Skin: Warm and dry; no appreciable rashes. Has bilateral lower extremity pressure wounds Neurologic: Not as confused and able to follow commands. No appreciable focal deficits Psychiatric: Normal mood and affect. Awake and more alert  Data Reviewed: I have personally reviewed following labs and imaging  studies  CBC: Recent Labs  Lab 09/04/17 1315 09/05/17 0115  WBC 20.4* 14.3*  NEUTROABS 18.5* 12.6*  HGB 9.6* 8.8*  HCT 30.2* 27.0*  MCV 93.2 92.2  PLT 185 762   Basic Metabolic Panel: Recent Labs  Lab 09/04/17 1315 09/05/17 0115  NA 146* 147*  K 4.0 3.6  CL 115* 117*  CO2 23 20*  GLUCOSE 130* 99  BUN 53* 52*  CREATININE 2.80* 2.32*  CALCIUM 8.5* 8.2*  MG  --  1.9  PHOS  --  3.8   GFR: Estimated Creatinine Clearance: 20.1 mL/min (A) (by C-G formula based on SCr of 2.32 mg/dL (H)). Liver Function Tests: Recent Labs  Lab 09/04/17 1315 09/05/17 0115  AST 30 23  ALT 20 18  ALKPHOS 118 100  BILITOT 0.9 0.9  PROT 4.7* 4.2*  ALBUMIN 1.9* 1.7*   No results for input(s): LIPASE, AMYLASE in the last 168 hours. No results for input(s): AMMONIA in the last 168  hours. Coagulation Profile: Recent Labs  Lab 09/04/17 1315  INR 1.65   Cardiac Enzymes: Recent Labs  Lab 09/04/17 2137 09/05/17 0115  TROPONINI 0.23* 0.20*   BNP (last 3 results) No results for input(s): PROBNP in the last 8760 hours. HbA1C: No results for input(s): HGBA1C in the last 72 hours. CBG: Recent Labs  Lab 09/04/17 1345 09/04/17 2103  GLUCAP 120* 114*   Lipid Profile: No results for input(s): CHOL, HDL, LDLCALC, TRIG, CHOLHDL, LDLDIRECT in the last 72 hours. Thyroid Function Tests: Recent Labs    09/04/17 2137  TSH 1.684   Anemia Panel: No results for input(s): VITAMINB12, FOLATE, FERRITIN, TIBC, IRON, RETICCTPCT in the last 72 hours. Sepsis Labs: Recent Labs  Lab 09/04/17 1325 09/04/17 1749 09/04/17 2137  PROCALCITON  --   --  55.53  LATICACIDVEN 1.62 1.74 2.0*    Recent Results (from the past 240 hour(s))  MRSA PCR Screening     Status: None   Collection Time: 09/04/17 10:11 PM  Result Value Ref Range Status   MRSA by PCR NEGATIVE NEGATIVE Final    Comment:        The GeneXpert MRSA Assay (FDA approved for NASAL specimens only), is one component of a comprehensive  MRSA colonization surveillance program. It is not intended to diagnose MRSA infection nor to guide or monitor treatment for MRSA infections.     Radiology Studies: Dg Chest 2 View  Result Date: 09/04/2017 CLINICAL DATA:  Shortness of breath. EXAM: CHEST  2 VIEW COMPARISON:  Radiographs of August 25, 2009. FINDINGS: Stable cardiomegaly. Atherosclerosis of thoracic aorta is noted. No pneumothorax is noted. No significant pleural effusion is noted. Bony thorax is unremarkable. IMPRESSION: Aortic atherosclerosis.  No acute cardiopulmonary abnormality seen. Electronically Signed   By: Marijo Conception, M.D.   On: 09/04/2017 15:21   Ct Head Wo Contrast  Result Date: 09/04/2017 CLINICAL DATA:  Altered mental status EXAM: CT HEAD WITHOUT CONTRAST TECHNIQUE: Contiguous axial images were obtained from the base of the skull through the vertex without intravenous contrast. COMPARISON:  None. FINDINGS: Brain: Predominantly low attenuation collection overlying the right convexity measures up to 10 mm in thickness. There is no associated midline shift or other mass effect. There is periventricular hypoattenuation compatible with chronic microvascular disease. Vascular: No hyperdense vessel or unexpected calcification. Skull: Normal visualized skull base, calvarium and extracranial soft tissues. Sinuses/Orbits: No sinus fluid levels or advanced mucosal thickening. No mastoid effusion. Normal orbits. 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 Electronically Signed   By: Ulyses Jarred M.D.   On: 09/04/2017 16:32   US Renal  Result Date: 09/04/2017 CLINICAL DATA:  Acute kidney injury. EXAM: RENAL / URINARY TRACT ULTRASOUND COMPLETE COMPARISON:  08/09/2017 unenhanced CT abdomen/pelvis. FINDINGS: Right Kidney: Length: 11.8 cm. Echogenic and mildly atrophic right renal parenchyma. No right hydronephrosis.  Simple exophytic 2.6 x 2.6 x 3.7 cm upper right renal cyst. Additional simple 1.2 cm interpolar right renal cyst. Incidentally noted nonocclusive thrombosis of IVC at the level of the right renal vein with IVC thrombus measuring up to 3.1 x 1.7 cm. Left Kidney: Length: 11.4 cm. Echogenic and mildly atrophic left renal parenchyma. No left hydronephrosis. Simple exophytic 4.0 x 3.2 x 3.2 cm lower left renal cyst. Limited visualization of additional simple appearing 2.1 cm renal cyst in upper left kidney. Bladder: Foley catheter is in place within minimally distended urinary bladder.  Suggestion of mild diffuse bladder wall thickening and trabeculation. IMPRESSION: 1. Incidentally noted nonocclusive thrombosis of the inferior vena cava at the level of the right renal vein. 2. No hydronephrosis. 3. Echogenic mildly atrophic kidneys, compatible with acute on chronic nonspecific renal parenchymal disease. 4. Simple bilateral renal cysts. 5. Foley catheter in place within the minimally distended urinary bladder. Suggestion of mild diffuse bladder wall thickening and trabeculation, probably due to chronic bladder outlet obstruction. Electronically Signed   By: Ilona Sorrel M.D.   On: 09/04/2017 20:55   Scheduled Meds: . cholecalciferol  2,000 Units Oral Daily  . enoxaparin (LOVENOX) injection  30 mg Subcutaneous Q24H  . mouth rinse  15 mL Mouth Rinse BID  . senna-docusate  2 tablet Oral BID  . cyanocobalamin  1,000 mcg Oral Daily   Continuous Infusions: . ceFEPime (MAXIPIME) IV    . dextrose 5 % and 0.45 % NaCl with KCl 20 mEq/L    . [START ON 09/06/2017] vancomycin      LOS: 1 day   Kerney Elbe, DO [facility] Pager [phone]  If 7PM-7AM, please contact night-coverage [email or web address] Password [facility] 09/05/2017, 7:36 AM

## 2017-09-05 NOTE — Progress Notes (Signed)
Bilateral lower extremity venous duplex has been completed. Negative for DVT.  09/05/17 12:13 PM [facility] RVT

## 2017-09-05 NOTE — Consult Note (Addendum)
[place] Nurse wound consult note Reason for Consult: Consult requested for BLE Wound type: Left heel with deep tissue injury; 2.5X3.5, dark purple-black, no open wound, fluctuance, or drainage Left anterior great toe with a partial thickness wound; .8X.8X.1cm, dark red dry wound bed, small amt pink drainage. Right anterior foot with dark red linear deep tissue injury; 6X.3cm Right heel with unstageable presure injury; 5.5X5.5cm; 70% patchy areas of black soft eschar, 30% red moist wound bed.  Small amt yellow drainage, no odor or fluctuance. Pressure Injury POA: Yes Dressing procedure/placement/frequency: Heels have Prevalon pressure-reducing boots on bilat.  Foam dressings to all locations to protect from further injury.  Santyl ointment to provide enzymatic debridement of nonviable tissue to unstageable right heel wound.  No family present to discuss plan of care. Please re-consult if further assistance is needed.  Thank-you,  Julien Girt MSN, [place], [place], [place], [place]

## 2017-09-05 NOTE — Progress Notes (Signed)
  Echocardiogram 2D Echocardiogram has been performed.  Jannett Celestine 09/05/2017, 3:06 PM

## 2017-09-05 NOTE — Progress Notes (Signed)
Initial Nutrition Assessment  DOCUMENTATION CODES:   Severe malnutrition in context of chronic illness  INTERVENTION:  Ensure Enlive BID (each supplement provides 350 kcal and 20 grams of protein), 30 mL Prostat BID (each supplement provides 100 kcal and 15 grams of protein), MVI daily   NUTRITION DIAGNOSIS:   Severe Malnutrition related to chronic illness(dementia) as evidenced by severe muscle depletion, severe fat depletion.    GOAL:   Patient will meet greater than or equal to 90% of their needs    MONITOR:   PO intake, Supplement acceptance, Labs, Weight trends, Skin, I & O's  REASON FOR ASSESSMENT:   Consult Assessment of nutrition requirement/status, Poor PO  ASSESSMENT:   84 yo male with PMH HTN, AAA, femoral neck fracture, DJD, skin tears was admitted on 1/16 from SNF with altered mental status, sepsis, and AKI superimposed on CKD   RN reported that pt has ill-fitting dentures and dried food in mouth at admission; also reported that speech eval completed and pt has been advanced to Dysphagia I Diet with thin liquids, but pt could not eat without assistance and required coaching from nurse to take bites of food.  Spoke with pt who reports he is in poor health. Pt also reported he was "eating well at home", but his reported daily dietary intake was limited to a meal of eggs and "1/3 a serving of vegetables" and a small meal (cheese sandwich).  Reviewed weight history- weight has been stable over the past month; however, review of prior weight history revealed gradual decline in weight of ~20 lb over past five years. Pt confirmed that he had lost weight but was unable to provide additional information about weight history.  Labs and medications reviewed.    NUTRITION - FOCUSED PHYSICAL EXAM:    Most Recent Value  Orbital Region  Moderate depletion  Upper Arm Region  Severe depletion  Thoracic and Lumbar Region  Unable to assess  Buccal Region  Severe depletion   Temple Region  Severe depletion  Clavicle Bone Region  Severe depletion  Clavicle and Acromion Bone Region  Severe depletion  Scapular Bone Region  Severe depletion  Dorsal Hand  Severe depletion  Patellar Region  Moderate depletion  Anterior Thigh Region  Moderate depletion  Posterior Calf Region  Moderate depletion  Edema (RD Assessment)  Mild  Hair  Unable to assess  Eyes  Reviewed  Mouth  Unable to assess  Skin  Reviewed  Nails  Reviewed       Diet Order:  Diet NPO time specified  EDUCATION NEEDS:   Not appropriate for education at this time  Skin:  Skin Assessment: Skin Integrity Issues: Skin Integrity Issues:: Unstageable, DTI, Other (Comment) DTI: Rt anterior foot, Left heel Stage I: NA Stage II: NA Unstageable: Rt heel Other: Skin tear; Abrasion; Ecchymosis arm, elbow, leg and back  Last BM:  1/16  Height:   Ht Readings from Last 1 Encounters:  09/04/17 6' (1.829 m)    Weight:   Wt Readings from Last 1 Encounters:  09/05/17 168 lb 6.9 oz (76.4 kg)    Ideal Body Weight:  80.7 kg  BMI:  Body mass index is 22.84 kg/m.  Estimated Nutritional Needs:   Kcal:  1,600-1,900 kcal  Protein:  95-105 gm  Fluid:  1.6-1.9 L    Edmonia Lynch, MS Dietetic Intern Pager: [phone]

## 2017-09-06 ENCOUNTER — Inpatient Hospital Stay (HOSPITAL_COMMUNITY): Payer: Medicare Other

## 2017-09-06 DIAGNOSIS — Z87891 Personal history of nicotine dependence: Secondary | ICD-10-CM

## 2017-09-06 DIAGNOSIS — R7881 Bacteremia: Secondary | ICD-10-CM

## 2017-09-06 DIAGNOSIS — N189 Chronic kidney disease, unspecified: Secondary | ICD-10-CM

## 2017-09-06 DIAGNOSIS — Z8781 Personal history of (healed) traumatic fracture: Secondary | ICD-10-CM

## 2017-09-06 DIAGNOSIS — I6202 Nontraumatic subacute subdural hemorrhage: Secondary | ICD-10-CM

## 2017-09-06 DIAGNOSIS — E876 Hypokalemia: Secondary | ICD-10-CM

## 2017-09-06 DIAGNOSIS — Z8679 Personal history of other diseases of the circulatory system: Secondary | ICD-10-CM

## 2017-09-06 DIAGNOSIS — A4102 Sepsis due to Methicillin resistant Staphylococcus aureus: Principal | ICD-10-CM

## 2017-09-06 DIAGNOSIS — L89899 Pressure ulcer of other site, unspecified stage: Secondary | ICD-10-CM

## 2017-09-06 DIAGNOSIS — I129 Hypertensive chronic kidney disease with stage 1 through stage 4 chronic kidney disease, or unspecified chronic kidney disease: Secondary | ICD-10-CM

## 2017-09-06 DIAGNOSIS — Z85038 Personal history of other malignant neoplasm of large intestine: Secondary | ICD-10-CM

## 2017-09-06 DIAGNOSIS — R0602 Shortness of breath: Secondary | ICD-10-CM

## 2017-09-06 DIAGNOSIS — B9562 Methicillin resistant Staphylococcus aureus infection as the cause of diseases classified elsewhere: Secondary | ICD-10-CM

## 2017-09-06 LAB — BLOOD CULTURE ID PANEL (REFLEXED)
ACINETOBACTER BAUMANNII: NOT DETECTED
Candida albicans: NOT DETECTED
Candida glabrata: NOT DETECTED
Candida krusei: NOT DETECTED
Candida parapsilosis: NOT DETECTED
Candida tropicalis: NOT DETECTED
ENTEROCOCCUS SPECIES: NOT DETECTED
Enterobacter cloacae complex: NOT DETECTED
Enterobacteriaceae species: NOT DETECTED
Escherichia coli: NOT DETECTED
HAEMOPHILUS INFLUENZAE: NOT DETECTED
Klebsiella oxytoca: NOT DETECTED
Klebsiella pneumoniae: NOT DETECTED
LISTERIA MONOCYTOGENES: NOT DETECTED
METHICILLIN RESISTANCE: DETECTED — AB
NEISSERIA MENINGITIDIS: NOT DETECTED
PROTEUS SPECIES: NOT DETECTED
PSEUDOMONAS AERUGINOSA: NOT DETECTED
SERRATIA MARCESCENS: NOT DETECTED
STAPHYLOCOCCUS SPECIES: DETECTED — AB
STREPTOCOCCUS AGALACTIAE: NOT DETECTED
STREPTOCOCCUS PNEUMONIAE: NOT DETECTED
Staphylococcus aureus (BCID): DETECTED — AB
Streptococcus pyogenes: NOT DETECTED
Streptococcus species: NOT DETECTED

## 2017-09-06 LAB — CBC WITH DIFFERENTIAL/PLATELET
Basophils Absolute: 0 10*3/uL (ref 0.0–0.1)
Basophils Relative: 0 %
Eosinophils Absolute: 0 10*3/uL (ref 0.0–0.7)
Eosinophils Relative: 0 %
HEMATOCRIT: 28.1 % — AB (ref 39.0–52.0)
Hemoglobin: 9 g/dL — ABNORMAL LOW (ref 13.0–17.0)
LYMPHS ABS: 1.2 10*3/uL (ref 0.7–4.0)
LYMPHS PCT: 10 %
MCH: 29.1 pg (ref 26.0–34.0)
MCHC: 32 g/dL (ref 30.0–36.0)
MCV: 90.9 fL (ref 78.0–100.0)
MONO ABS: 0.6 10*3/uL (ref 0.1–1.0)
MONOS PCT: 5 %
NEUTROS PCT: 85 %
Neutro Abs: 9.9 10*3/uL — ABNORMAL HIGH (ref 1.7–7.7)
Platelets: 189 10*3/uL (ref 150–400)
RBC: 3.09 MIL/uL — ABNORMAL LOW (ref 4.22–5.81)
RDW: 15.7 % — AB (ref 11.5–15.5)
WBC: 11.8 10*3/uL — ABNORMAL HIGH (ref 4.0–10.5)

## 2017-09-06 LAB — COMPREHENSIVE METABOLIC PANEL
ALT: 18 U/L (ref 17–63)
ANION GAP: 10 (ref 5–15)
AST: 26 U/L (ref 15–41)
Albumin: 1.7 g/dL — ABNORMAL LOW (ref 3.5–5.0)
Alkaline Phosphatase: 105 U/L (ref 38–126)
BILIRUBIN TOTAL: 0.7 mg/dL (ref 0.3–1.2)
BUN: 44 mg/dL — ABNORMAL HIGH (ref 6–20)
CALCIUM: 8.9 mg/dL (ref 8.9–10.3)
CO2: 21 mmol/L — ABNORMAL LOW (ref 22–32)
Chloride: 117 mmol/L — ABNORMAL HIGH (ref 101–111)
Creatinine, Ser: 1.77 mg/dL — ABNORMAL HIGH (ref 0.61–1.24)
GFR, EST AFRICAN AMERICAN: 36 mL/min — AB (ref >60)
GFR, EST NON AFRICAN AMERICAN: 31 mL/min — AB (ref >60)
Glucose, Bld: 132 mg/dL — ABNORMAL HIGH (ref 65–99)
POTASSIUM: 3.4 mmol/L — AB (ref 3.5–5.1)
Sodium: 148 mmol/L — ABNORMAL HIGH (ref 135–145)
TOTAL PROTEIN: 4.5 g/dL — AB (ref 6.5–8.1)

## 2017-09-06 LAB — CK: CK TOTAL: 34 U/L — AB (ref 49–397)

## 2017-09-06 LAB — MAGNESIUM: MAGNESIUM: 1.9 mg/dL (ref 1.7–2.4)

## 2017-09-06 LAB — PHOSPHORUS: PHOSPHORUS: 2.4 mg/dL — AB (ref 2.5–4.6)

## 2017-09-06 MED ORDER — POTASSIUM CL IN DEXTROSE 5% 20 MEQ/L IV SOLN
20.0000 meq | INTRAVENOUS | Status: DC
  Administered 2017-09-06 – 2017-09-15 (×13): 20 meq via INTRAVENOUS
  Filled 2017-09-06 (×13): qty 1000

## 2017-09-06 MED ORDER — VANCOMYCIN HCL IN DEXTROSE 750-5 MG/150ML-% IV SOLN
750.0000 mg | INTRAVENOUS | Status: DC
  Filled 2017-09-06: qty 150

## 2017-09-06 MED ORDER — SODIUM CHLORIDE 0.9 % IV SOLN
8.0000 mg/kg | INTRAVENOUS | Status: DC
  Administered 2017-09-06 – 2017-09-08 (×2): 611 mg via INTRAVENOUS
  Filled 2017-09-06 (×4): qty 12.22

## 2017-09-06 MED ORDER — K PHOS MONO-SOD PHOS DI & MONO 155-852-130 MG PO TABS
500.0000 mg | ORAL_TABLET | Freq: Two times a day (BID) | ORAL | Status: AC
  Administered 2017-09-06 (×2): 500 mg via ORAL
  Filled 2017-09-06 (×3): qty 2

## 2017-09-06 MED ORDER — POTASSIUM PHOSPHATE MONOBASIC 500 MG PO TABS
500.0000 mg | ORAL_TABLET | Freq: Two times a day (BID) | ORAL | Status: DC
  Filled 2017-09-06: qty 1

## 2017-09-06 MED ORDER — POTASSIUM CHLORIDE CRYS ER 20 MEQ PO TBCR
40.0000 meq | EXTENDED_RELEASE_TABLET | Freq: Two times a day (BID) | ORAL | Status: AC
  Administered 2017-09-06 (×2): 40 meq via ORAL
  Filled 2017-09-06 (×2): qty 2

## 2017-09-06 NOTE — Progress Notes (Signed)
PHARMACY - PHYSICIAN COMMUNICATION CRITICAL VALUE ALERT - BLOOD CULTURE IDENTIFICATION (BCID)  Garrett Snow is an 84 y.o. male who presented to [facility] on 09/04/2017 with a chief complaint of AMS/SOB  Assessment:   1/2 Blood cultures growing MRSA  Name of physician (or Provider) Contacted:  Left sticky note for MD  Current antibiotics:  Vancomycin and Cefepime   Changes to prescribed antibiotics recommended:  Consider narrowing therapy to Vancomycin alone    Results for orders placed or performed during the hospital encounter of 09/04/17  Blood Culture ID Panel (Reflexed) (Collected: 09/04/2017  1:23 PM)  Result Value Ref Range   Enterococcus species NOT DETECTED NOT DETECTED   Listeria monocytogenes NOT DETECTED NOT DETECTED   Staphylococcus species DETECTED (A) NOT DETECTED   Staphylococcus aureus DETECTED (A) NOT DETECTED   Methicillin resistance DETECTED (A) NOT DETECTED   Streptococcus species NOT DETECTED NOT DETECTED   Streptococcus agalactiae NOT DETECTED NOT DETECTED   Streptococcus pneumoniae NOT DETECTED NOT DETECTED   Streptococcus pyogenes NOT DETECTED NOT DETECTED   Acinetobacter baumannii NOT DETECTED NOT DETECTED   Enterobacteriaceae species NOT DETECTED NOT DETECTED   Enterobacter cloacae complex NOT DETECTED NOT DETECTED   Escherichia coli NOT DETECTED NOT DETECTED   Klebsiella oxytoca NOT DETECTED NOT DETECTED   Klebsiella pneumoniae NOT DETECTED NOT DETECTED   Proteus species NOT DETECTED NOT DETECTED   Serratia marcescens NOT DETECTED NOT DETECTED   Haemophilus influenzae NOT DETECTED NOT DETECTED   Neisseria meningitidis NOT DETECTED NOT DETECTED   Pseudomonas aeruginosa NOT DETECTED NOT DETECTED   Candida albicans NOT DETECTED NOT DETECTED   Candida glabrata NOT DETECTED NOT DETECTED   Candida krusei NOT DETECTED NOT DETECTED   Candida parapsilosis NOT DETECTED NOT DETECTED   Candida tropicalis NOT DETECTED NOT DETECTED    Caryl Pina 09/06/2017  1:58 AM

## 2017-09-06 NOTE — Progress Notes (Signed)
PROGRESS NOTE    Garrett Snow  ZOX:[id number] DOB: 1919/04/17 DOA: 09/04/2017 PCP: [name], MD   Brief Narrative:  Garrett Snow is a 84 y.o. male with medical history significant of HTN, DJD, Recent Right Femoral Neck Fx s/p Hip Arthroplasty 12/26, Hx of AAA, and other comorbids who presented to [name] from [facility]F with AMS and increased work of breathing. Per family patient at baseline has some dementia but is able to carry on a conversation to an extent. Patient's family noticed a decline yesterday morning and noticed that he was more confused than baseline. Work up performed at [facility] included a CXR which showed PNA and patient was started on Augmentin. Because of worsening confusion and Hypotension, he was sent to the ED for evaluation. Upon my evaluation patient is confused but is able to answer some questions. Denies any Pain. Patient's son is at bedside who states he has gotten worse. TRH was asked to admit for Sepsis and AMS. Patient is improving slightly and was more alert and responsive today. Patient's Blood Cx grew 1/2 MRSA so will repeat Blood Cx and likely narrow just to Vancomycin. Infectious Diseases was automatically consulted because of positive BCID.   Assessment & Plan:   Active Problems:   AAA (abdominal aortic aneurysm) (HCC)   Fracture of femoral neck, right (HCC)   Hip fracture (HCC)   Sepsis (HCC)   Acute kidney injury superimposed on chronic kidney disease ([place])   Acute urinary retention   Localized swelling of both lower legs   Normocytic anemia   Leukocytosis   Pressure injury of skin   Protein-calorie malnutrition, severe   Hypokalemia   Hypophosphatemia  Sepsis from MRSA Bacteremia, and less likely HCAP, improving -Sepsis Protocol Initiated in ED -Admitted to SDU but hemodynamically stable to transfer to Telemetry  -Outside Facility felt as if he had a LLL Opacity  -Patient was Hypotensive, has a Leukocytosis, Tachypenic and was diagnosed with a PNA  at [facility] in [place] -Received 30 mL/kg Sepsis Protocol; Maintenance IVF with NS at 75 mL/hr; Changed to D5W yesterday given Hypernatremia and Hypercholremia; Now will decrease rate to 50 mL/hr and possibly D/C IVF in AM  -Sepsis Physiology is improving  -Checked Procalcitonin and was 55.53 and Trend Lactic Acid Levels and last was 2.0 -C/w Broad Spectrum Abx with IV Vancomycin; IV Cefepime discontinued given +MRSA in blood  -Checked Urinalysis and was Negative and Urine Cx showed No Growth (but was on Abx); Obtain Sputum Cx -Blood Cx x2 obtained in ED show NGTD at 1 day however 1/2 showed MRSA from [place]; Will repeat Blood Cx's today -CXR yesterday showed Aortic atherosclerosis.  No acute cardiopulmonary abnormality seen. 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 -I called [name] in Neurosurgery who states he will review the scan and make recommendations; He recommended no intervention at this time and does not feel as if Patient's Subdural Hygroma is the cause of patient's encephalopathy  -NPO until SLP; SLP recommending Dysphagia 1 Puree Thin Liquids  -Treat Suspected Infectious Etiology with Broad Spectrum Abx; Will likely narrow just to Vancomycin  -Seizure Precautions; Aspiration Precautions -Follow  up on Cx's  -Patient is still slightly Hypernatremic. Will give IVF Resuscitation and changed to D5W at lower rate and repeat in AM   Hypotension but Hx of Hypertension -Hold All Antihypertensives including Amlodipine and Proscar -C/w IVF Resuscitation as  above  AKI on CKD Stage 3 -Baseline Cr was 1.4-1.5 -BUN/Cr on Admission showed patient to be 53/2.80; Now improved to 44/1.77 -? Obstruction from Acute Urinary Retention;  -Obtained Renal U/S and showed No hydronephrosis. Echogenic mildly atrophic kidneys, compatible with acute on chronic nonspecific renal parenchymal disease. Simple bilateral renal cysts. Foley catheter in place within the minimally distended urinary bladder. Suggestion of mild diffuse bladder wall thickening and trabeculation, probably due to chronic bladder outlet obstruction -Bladder Scan showed 646 -Place Foley Catheter to Gravity and will continue -Avoid Nephrotoxic Medications -C/w Maintenance IVF Rehydration as above -Repeat CMP in AM  Acute Urinary Retention -Bladder Scan showed 646 mL on Admission  -Checked Renal U/S and showed mild diffuse bladder wall thickening and trabeculation, probably due to chronic bladder outlet obstruction -Placed Foley Cathter -Finasteride held because of Hypotension   Non-Occlusive Thrombus of the Inferior Vena Cava -Noted on Renal U/S and was at the level of the Right Renal Vein -Difficult situation and don't know if it is accute; Hesitant to Anticoagulate because of Falls and with Hx of Subdural hematoma -Discussed with [name] in Vascular who recommended to discuss with IR about appropriate Imaging Modalities and ? Of IVC Filter -Discussed Case with Dr. Wallene Dales who recommended no IVC filter because above the Renal Vein; Per [name] no appropriate imaging can be done as cannot use Contrast due to patient's Kidney Function. After discussion with [name] and review of the films, he cannot exclude tumor -For now we will just monitor the patient and discuss with his family about findings   Recent Right Hip Femoral Neck Fx -S/p Right Hemiarthroplasty by [name] 12/26 -PT/OT to Evaluate and treat -C/w VTE Prophylaxis with Lovenox 30 mg sq daily  Bilateral  Lower Extremity Swelling -Possibly due to poor Nutritional Status with low Albumin of 1.8 -Nutritionist Consulted and awaiting Recc's -Checked ECHOCardiogram and EF was 60-65% and had Grade 1 DD -LE Duplex Negative for DVT -Checked Urinalysis to r/o Nephrotic Syndrome and had no Protein   Bilateral LE Leg Wounds/Ulcers -WOC Nurse Consultation appreciated -Recommendations per [place] Nurse note  Normocytic Anemia -Patient's Hb/Hct was 9.6/30.2 on admission and went to 9.0/28.1  -Continue to Monitor for S/Sx of Bleeding and expect Dilutional Drop -Repeat CBC in AM  Hx of AAA -Last CT Abd/Pelvis 08/17/17 showed Ectatic Abdominal Aorta at risk for Aneurysm Development -Follow up as an Outpatient   Leukocytosis -Patient's WBC was 20.4 on admission and improved to 11.8 -Likely infectious in origin; C/w IV Abx as designated as above -Repeat CBC in AM   Hypernatremia/Hypercholremia -Patient's Na+ was still elevated at 148 and Chloride was 117 -Started D5W at 75 mL/hr yesterday but reduced rate to 50 mL/hr -Repeat CMP in AM  Hypokalemia -Patient's K+ was 3.4 this AM -Replete with po KCl 40 mEQ BID x2 and with KPhos 500 mg po BID x2 -Patient also receiving IVF Resuscitation with D5W + 20 mEQ KCl -Continue to Monitor and Replete as Necessary -Repeat K+ in AM   Hypophosphatemia -Patient's Phos Level was 2.4 -Replete with po KPhos 500 mg po BIDx 2 -Continue to Monitor and Replete as Necessary -Repeat Phos Level in AM   DVT prophylaxis: Enoxaparin 30 mg sq q24h  Code Status: DO NOT RESUSCITATE Family Communication: No family present at bedside but updated sons over the phone Disposition Plan: Remain Inpatient for further evaluation and workup  Consultants:   Discussed Case with Vascular Surgery [name]  Discussed Case with IR [name]   Procedures:  ECHOCARDIOGRAM ------------------------------------------------------------------- Study Conclusions  - Left  ventricle: The cavity size was normal. There was moderate   concentric hypertrophy. Systolic function was normal. The   estimated ejection fraction was in the range of 60% to 65%. Wall   motion was normal; there were no regional wall motion   abnormalities. Doppler parameters are consistent with abnormal   left ventricular relaxation (grade 1 diastolic dysfunction). - Aortic valve: There was no regurgitation. - Mitral valve: Calcified annulus. Mildly thickened leaflets .   There was moderate regurgitation. - Left atrium: The atrium was moderately dilated. - Right ventricle: The cavity size was normal. Wall thickness was   normal. Systolic function was normal. - Right atrium: The atrium was normal in size. - Tricuspid valve: There was mild regurgitation. - Pulmonary arteries: Systolic pressure was moderately increased.   PA peak pressure: 46 mm Hg (S). - Inferior vena cava: The vessel was normal in size.  LOWER EXTREMITY DUPLEX Negative for DVT.  Antimicrobials:  Anti-infectives (From admission, onward)   Start     Dose/Rate Route Frequency Ordered Stop   09/06/17 1600  vancomycin (VANCOCIN) IVPB 1000 mg/200 mL premix     1,000 mg 200 mL/hr over 60 Minutes Intravenous Every 48 hours 09/04/17 1528     09/05/17 1600  ceFEPIme (MAXIPIME) 1 g in dextrose 5 % 50 mL IVPB  Status:  Discontinued     1 g 100 mL/hr over 30 Minutes Intravenous Every 24 hours 09/04/17 1528 09/06/17 1003   09/04/17 1530  vancomycin (VANCOCIN) 1,500 mg in sodium chloride 0.9 % 500 mL IVPB     1,500 mg 250 mL/hr over 120 Minutes Intravenous STAT 09/04/17 1528 09/04/17 1911   09/04/17 1530  ceFEPIme (MAXIPIME) 2 g in dextrose 5 % 50 mL IVPB     2 g 100 mL/hr over 30 Minutes Intravenous STAT 09/04/17 1528 09/04/17 1713     Subjective: Seen and examined at bedside and was even more alert today. Had no complaints but states he had some pain earlier but was not able to tell me where. States he is feeling better.  Slightly demented but no overnight issues.   Objective: Vitals:   09/06/17 0400 09/06/17 0500 09/06/17 0600 09/06/17 0700  BP: (!) 111/48 (!) 123/106 112/61   Pulse: (!) 57 (!) 56 62   Resp: 17 (!) 26 (!) 23 19  Temp:    99.1 F (37.3 C)  TempSrc:    Oral  SpO2: 100% 100% 100%   Weight:      Height:        Intake/Output Summary (Last 24 hours) at 09/06/2017 1020 Last data filed at 09/06/2017 1696 Gross per 24 hour  Intake 1521.25 ml  Output 1750 ml  Net -228.75 ml   Filed Weights   09/04/17 2206 09/05/17 0309  Weight: 73.9 kg (162 lb 14.7 oz) 76.4 kg (168 lb 6.9 oz)   Examination: Physical Exam:  Constitutional: Elderly Caucasian male in NAD who appears calm and comfortable  Eyes: Sclerae anicteric. Lids not injected ENMT: External ears and nose appear normal. MMM more moist Neck: Supple with no JVD Respiratory: Unlabored breathing. No appreciable wheezing/rales/rhonchi Cardiovascular: RRR; Has 1+ LE edema Abdomen: Soft, NT,  ND. Bowel sounds present GU: Deferred. Has foley cathter in place draining clear/yellow urine Musculoskeletal: No contractures; No cyanosis Skin: Warm and dry. Has diffuse seborrheic keratosis spread throughout face and upper chest. Has bilateral LE pressure wounds that are covered Neurologic: CN 2-12 grossly intact. No appreciable focal deficits Psychiatric: Normal mood and affect. Awake and alert  Data Reviewed: I have personally reviewed following labs and imaging studies  CBC: Recent Labs  Lab 09/04/17 1315 09/05/17 0115 09/05/17 0741 09/06/17 0357  WBC 20.4* 14.3* 14.3* 11.8*  NEUTROABS 18.5* 12.6*  --  9.9*  HGB 9.6* 8.8* 9.0* 9.0*  HCT 30.2* 27.0* 28.5* 28.1*  MCV 93.2 92.2 92.5 90.9  PLT 185 171 177 409   Basic Metabolic Panel: Recent Labs  Lab 09/04/17 1315 09/05/17 0115 09/05/17 0741 09/06/17 0357  NA 146* 147* 148* 148*  K 4.0 3.6 3.7 3.4*  CL 115* 117* 118* 117*  CO2 23 20* 21* 21*  GLUCOSE 130* 99 93 132*  BUN 53*  52* 55* 44*  CREATININE 2.80* 2.32* 2.21* 1.77*  CALCIUM 8.5* 8.2* 8.5* 8.9  MG  --  1.9  --  1.9  PHOS  --  3.8  --  2.4*   GFR: Estimated Creatinine Clearance: 26.4 mL/min (A) (by C-G formula based on SCr of 1.77 mg/dL (H)). Liver Function Tests: Recent Labs  Lab 09/04/17 1315 09/05/17 0115 09/05/17 0741 09/06/17 0357  AST 30 23 21 26   ALT 20 18 16* 18  ALKPHOS 118 100 99 105  BILITOT 0.9 0.9 1.1 0.7  PROT 4.7* 4.2* 4.4* 4.5*  ALBUMIN 1.9* 1.7* 1.8* 1.7*   No results for input(s): LIPASE, AMYLASE in the last 168 hours. No results for input(s): AMMONIA in the last 168 hours. Coagulation Profile: Recent Labs  Lab 09/04/17 1315  INR 1.65   Cardiac Enzymes: Recent Labs  Lab 09/04/17 2137 09/05/17 0115 09/05/17 0741  TROPONINI 0.23* 0.20* 0.15*   BNP (last 3 results) No results for input(s): PROBNP in the last 8760 hours. HbA1C: No results for input(s): HGBA1C in the last 72 hours. CBG: Recent Labs  Lab 09/04/17 1345 09/04/17 2103  GLUCAP 120* 114*   Lipid Profile: No results for input(s): CHOL, HDL, LDLCALC, TRIG, CHOLHDL, LDLDIRECT in the last 72 hours. Thyroid Function Tests: Recent Labs    09/04/17 2137  TSH 1.684   Anemia Panel: No results for input(s): VITAMINB12, FOLATE, FERRITIN, TIBC, IRON, RETICCTPCT in the last 72 hours. Sepsis Labs: Recent Labs  Lab 09/04/17 1325 09/04/17 1749 09/04/17 2137  PROCALCITON  --   --  55.53  LATICACIDVEN 1.62 1.74 2.0*    Recent Results (from the past 240 hour(s))  Culture, blood (Routine x 2)     Status: None (Preliminary result)   Collection Time: 09/04/17  1:15 PM  Result Value Ref Range Status   Specimen Description BLOOD LEFT FOREARM  Final   Special Requests   Final    BOTTLES DRAWN AEROBIC AND ANAEROBIC Blood Culture adequate volume   Culture NO GROWTH 1 DAY  Final   Report Status PENDING  Incomplete  Culture, blood (Routine x 2)     Status: None (Preliminary result)   Collection Time: 09/04/17   1:23 PM  Result Value Ref Range Status   Specimen Description BLOOD ARM RIGHT  Final   Special Requests IN PEDIATRIC BOTTLE Blood Culture adequate volume  Final   Culture  Setup Time   Final    GRAM POSITIVE COCCI IN PEDIATRIC BOTTLE CRITICAL  RESULT CALLED TO, READ BACK BY AND VERIFIED WITH: G ABBOTT PHARMD 0149 09/06/17 A BROWNING    Culture   Final    GRAM POSITIVE COCCI CULTURE REINCUBATED FOR BETTER GROWTH    Report Status PENDING  Incomplete  Blood Culture ID Panel (Reflexed)     Status: Abnormal   Collection Time: 09/04/17  1:23 PM  Result Value Ref Range Status   Enterococcus species NOT DETECTED NOT DETECTED Final   Listeria monocytogenes NOT DETECTED NOT DETECTED Final   Staphylococcus species DETECTED (A) NOT DETECTED Final    Comment: CRITICAL RESULT CALLED TO, READ BACK BY AND VERIFIED WITH: G ABBOTT PHARMD 0149 09/06/17 A BROWNING    Staphylococcus aureus DETECTED (A) NOT DETECTED Final    Comment: Methicillin (oxacillin)-resistant Staphylococcus aureus (MRSA). MRSA is predictably resistant to beta-lactam antibiotics (except ceftaroline). Preferred therapy is vancomycin unless clinically contraindicated. Patient requires contact precautions if  hospitalized. CRITICAL RESULT CALLED TO, READ BACK BY AND VERIFIED WITH: G ABBOTT PHARMD 0149 09/06/17 A BROWNING    Methicillin resistance DETECTED (A) NOT DETECTED Final    Comment: CRITICAL RESULT CALLED TO, READ BACK BY AND VERIFIED WITH: G ABBOTT PHARMD 0149 09/06/17 A BROWNING    Streptococcus species NOT DETECTED NOT DETECTED Final   Streptococcus agalactiae NOT DETECTED NOT DETECTED Final   Streptococcus pneumoniae NOT DETECTED NOT DETECTED Final   Streptococcus pyogenes NOT DETECTED NOT DETECTED Final   Acinetobacter baumannii NOT DETECTED NOT DETECTED Final   Enterobacteriaceae species NOT DETECTED NOT DETECTED Final   Enterobacter cloacae complex NOT DETECTED NOT DETECTED Final   Escherichia coli NOT DETECTED NOT  DETECTED Final   Klebsiella oxytoca NOT DETECTED NOT DETECTED Final   Klebsiella pneumoniae NOT DETECTED NOT DETECTED Final   Proteus species NOT DETECTED NOT DETECTED Final   Serratia marcescens NOT DETECTED NOT DETECTED Final   Haemophilus influenzae NOT DETECTED NOT DETECTED Final   Neisseria meningitidis NOT DETECTED NOT DETECTED Final   Pseudomonas aeruginosa NOT DETECTED NOT DETECTED Final   Candida albicans NOT DETECTED NOT DETECTED Final   Candida glabrata NOT DETECTED NOT DETECTED Final   Candida krusei NOT DETECTED NOT DETECTED Final   Candida parapsilosis NOT DETECTED NOT DETECTED Final   Candida tropicalis NOT DETECTED NOT DETECTED Final  Urine culture     Status: None   Collection Time: 09/04/17  5:42 PM  Result Value Ref Range Status   Specimen Description URINE, RANDOM  Final   Special Requests NONE  Final   Culture NO GROWTH  Final   Report Status 09/05/2017 FINAL  Final  MRSA PCR Screening     Status: None   Collection Time: 09/04/17 10:11 PM  Result Value Ref Range Status   MRSA by PCR NEGATIVE NEGATIVE Final    Comment:        The GeneXpert MRSA Assay (FDA approved for NASAL specimens only), is one component of a comprehensive MRSA colonization surveillance program. It is not intended to diagnose MRSA infection nor to guide or monitor treatment for MRSA infections.     Radiology Studies: Dg Chest 2 View  Result Date: 09/04/2017 CLINICAL DATA:  Shortness of breath. EXAM: CHEST  2 VIEW COMPARISON:  Radiographs of August 25, 2009. FINDINGS: Stable cardiomegaly. Atherosclerosis of thoracic aorta is noted. No pneumothorax is noted. No significant pleural effusion is noted. Bony thorax is unremarkable. IMPRESSION: Aortic atherosclerosis.  No acute cardiopulmonary abnormality seen. Electronically Signed   By: Marijo Conception, M.D.   On: 09/04/2017 15:21  Ct Head Wo Contrast  Result Date: 09/04/2017 CLINICAL DATA:  Altered mental status EXAM: CT HEAD WITHOUT  CONTRAST TECHNIQUE: Contiguous axial images were obtained from the base of the skull through the vertex without intravenous contrast. COMPARISON:  None. FINDINGS: Brain: Predominantly low attenuation collection overlying the right convexity measures up to 10 mm in thickness. There is no associated midline shift or other mass effect. There is periventricular hypoattenuation compatible with chronic microvascular disease. Vascular: No hyperdense vessel or unexpected calcification. Skull: Normal visualized skull base, calvarium and extracranial soft tissues. Sinuses/Orbits: No sinus fluid levels or advanced mucosal thickening. No mastoid effusion. Normal orbits. 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 Electronically Signed   By: Ulyses Jarred M.D.   On: 09/04/2017 16:32   US Renal  Result Date: 09/04/2017 CLINICAL DATA:  Acute kidney injury. EXAM: RENAL / URINARY TRACT ULTRASOUND COMPLETE COMPARISON:  08/09/2017 unenhanced CT abdomen/pelvis. FINDINGS: Right Kidney: Length: 11.8 cm. Echogenic and mildly atrophic right renal parenchyma. No right hydronephrosis. Simple exophytic 2.6 x 2.6 x 3.7 cm upper right renal cyst. Additional simple 1.2 cm interpolar right renal cyst. Incidentally noted nonocclusive thrombosis of IVC at the level of the right renal vein with IVC thrombus measuring up to 3.1 x 1.7 cm. Left Kidney: Length: 11.4 cm. Echogenic and mildly atrophic left renal parenchyma. No left hydronephrosis. Simple exophytic 4.0 x 3.2 x 3.2 cm lower left renal cyst. Limited visualization of additional simple appearing 2.1 cm renal cyst in upper left kidney. Bladder: Foley catheter is in place within minimally distended urinary bladder. Suggestion of mild diffuse bladder wall thickening and trabeculation. IMPRESSION: 1. Incidentally noted nonocclusive thrombosis of the inferior vena cava at the level  of the right renal vein. 2. No hydronephrosis. 3. Echogenic mildly atrophic kidneys, compatible with acute on chronic nonspecific renal parenchymal disease. 4. Simple bilateral renal cysts. 5. Foley catheter in place within the minimally distended urinary bladder. Suggestion of mild diffuse bladder wall thickening and trabeculation, probably due to chronic bladder outlet obstruction. Electronically Signed   By: Ilona Sorrel M.D.   On: 09/04/2017 20:55   Dg Chest Port 1 View  Result Date: 09/06/2017 CLINICAL DATA:  Shortness of breath. EXAM: PORTABLE CHEST 1 VIEW COMPARISON:  09/04/2017 FINDINGS: There is no evidence of pulmonary edema, consolidation, pneumothorax, nodule or pleural fluid. The heart size is normal. Stable atherosclerosis and tortuosity of the thoracic aorta. IMPRESSION: No active disease. Electronically Signed   By: Aletta Edouard M.D.   On: 09/06/2017 09:44   Scheduled Meds: . chlorhexidine  15 mL Mouth Rinse BID  . cholecalciferol  2,000 Units Oral Daily  . collagenase   Topical Daily  . enoxaparin (LOVENOX) injection  30 mg Subcutaneous Q24H  . feeding supplement (ENSURE ENLIVE)  237 mL Oral BID BM  . feeding supplement (PRO-STAT SUGAR FREE 64)  30 mL Oral BID  . mouth rinse  15 mL Mouth Rinse q12n4p  . multivitamin with minerals  1 tablet Oral Daily  . phosphorus  500 mg Oral BID WC  . potassium chloride  40 mEq Oral BID  . senna-docusate  2 tablet Oral BID  . cyanocobalamin  1,000 mcg Oral Daily   Continuous Infusions: . dextrose 5 % with KCl 20 mEq / L 20 mEq (09/06/17 0803)  . vancomycin      LOS: 2 days   Kerney Elbe, DO [facility] Pager  [phone]  If 7PM-7AM, please contact night-coverage [email or web address] Password [email or web address] 09/06/2017, 10:20 AM

## 2017-09-06 NOTE — Progress Notes (Signed)
Nutrition Follow-up  DOCUMENTATION CODES:   Severe malnutrition in context of chronic illness  INTERVENTION:  Ensure Enlive BID (each supplement provides 350 kcal and 20 grams of protein), 30 mL Prostat BID (each supplement provides 100 kcal and 15 grams of protein), MVI daily    NUTRITION DIAGNOSIS:   Severe Malnutrition related to chronic illness(dementia) as evidenced by severe muscle depletion, severe fat depletion.    GOAL:   Patient will meet greater than or equal to 90% of their needs    MONITOR:   PO intake, Supplement acceptance, Labs, Weight trends, Skin, I & O's  REASON FOR ASSESSMENT:   Consult Assessment of nutrition requirement/status, Poor PO, Calorie Count  ASSESSMENT:   84 yo male with PMH HTN, AAA, femoral neck fracture, DJD, skin tears was admitted on 1/16 from SNF with altered mental status, sepsis, and AKI superimposed on CKD  Discussed pt status with SLP. Speech will perform a swallow assessment and try to place dentures in attempt to facilitate diet advancement. RN reported that the only food consumed since last RD visit are a couple bites of applesauce to administer medications which required assistance/encouragement from RN.  Pt reported that he tries to eat/drink but indicated that he experiences discomfort in the lower abdominal area when he consumes food/drink.  Pt could not remember what he ate for breakfast. A.M. Ensure and Prostat were not consumed. RD talked to pt about importance of taking Ensure and Prostat to get better. Pt said he would do his best to consume Ensure and Prostat and shared several times that he wanted a cheese sandwich.  Medications and Labs reviewed.   Diet Order:  DIET - DYS 1 Room service appropriate? Yes; Fluid consistency: Thin  EDUCATION NEEDS:   Not appropriate for education at this time  Skin:  Skin Assessment: Skin Integrity Issues: Skin Integrity Issues:: Unstageable, DTI, Other (Comment) DTI: Rt  anterior foot, Left heel Stage I: NA Stage II: NA Unstageable: Rt heel Other: Skin tear; Abrasion; Ecchymosis arm, elbow, leg and back  Last BM:  1/16  Height:   Ht Readings from Last 1 Encounters:  09/04/17 6' (1.829 m)    Weight:   Wt Readings from Last 1 Encounters:  09/05/17 168 lb 6.9 oz (76.4 kg)    Ideal Body Weight:  80.7 kg  BMI:  Body mass index is 22.84 kg/m.  Estimated Nutritional Needs:   Kcal:  1,600-1,900 kcal  Protein:  95-105 gm  Fluid:  1.6-1.9 L   Edmonia Lynch, MS Dietetic Intern Pager [phone]

## 2017-09-06 NOTE — Consult Note (Signed)
[facility] for Infectious Disease   Reason for Consult: MRSA Bacteremia     Referring Physician: Dr. Alfredia Ferguson   Active Problems:   AAA (abdominal aortic aneurysm) ([place])   Fracture of femoral neck, right (H[facility])   Hip fracture ([place])   Sepsis ([place])   Acute kidney injury superimposed on chronic kidney disease ([place])   Acute urinary retention   Localized swelling of both lower legs   Normocytic anemia   Leukocytosis   Pressure injury of skin   Protein-calorie malnutrition, severe   . chlorhexidine  15 mL Mouth Rinse BID  . cholecalciferol  2,000 Units Oral Daily  . collagenase   Topical Daily  . enoxaparin (LOVENOX) injection  30 mg Subcutaneous Q24H  . feeding supplement (ENSURE ENLIVE)  237 mL Oral BID BM  . feeding supplement (PRO-STAT SUGAR FREE 64)  30 mL Oral BID  . mouth rinse  15 mL Mouth Rinse q12n4p  . multivitamin with minerals  1 tablet Oral Daily  . phosphorus  500 mg Oral BID WC  . potassium chloride  40 mEq Oral BID  . senna-docusate  2 tablet Oral BID  . cyanocobalamin  1,000 mcg Oral Daily    Recommendations: Cefepime discontinued. Patient will need 4 weeks total of IV abx. Will transition to IV daptomycin in the setting of baseline CKD and concern for renal toxicity with long term vancomycin use. Please have PICC line placed prior to discharge back to SNF.  F/u repeat blood cultures collected today 1/18. Please obtain xrays of right hip to evaluate for infected hardware as a potential source. Wound care for pressure ulcers. No TEE at this time. Given patient's age, he is high risk for invasive procedures.   Assessment: Patient with MRSA bacteremia, unclear source. Recent right hip arthroplasty on 12/26. Incision appears well healed. Also with multiple decubitus ulcers of his lower extremities.   Antibiotics:  1/16 IV vancomycin >> Transition to IV daptomycin  1/16 IV cefepime >> discontinue   HPI: Garrett Snow is a 84 y.o. male with past medical history  significant for HTN, colon cancer, recent right femoral neck fracture s/p hip arthroplasty 12/26, hx of AAA who presented on 1/16 from SNF with AMS and increased work of breathing. Per family, patient has dementia but is conversational at baseline. Work up at his living facility included a CXR concerning for PNA, and patient was started on Augmentin. He developed worsening AMS and hypotension and was sent to the ED. On arrival, patient was afebrile but hypotensive with BP 69/38. Oxygen saturation was 96% on 2L, HR 71, RR 22. CXR at that time was negative for acute abnormality. 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 Labs were notable for a leukocytosis of 20 and acute on chronic renal dysfunction, creatinine 2.8 from baseline 1.4. BP responded to IVF resuscitation and he was started on broad spectrum antibiotics with IV vancomycin and cefepime.   Blood cultures collected in the ED grew MRSA in 1/2 cultures, 2/2 with gram positive cocci final read pending. TTE negative for evidence of vegetation. Repeat cultures collected 1/18.  Review of Systems: Unable to obtain due to AMS. All other systems reviewed and are negative    Past Medical History:  Diagnosis Date  . AAA (abdominal aortic aneurysm) ([place])   . DJD (degenerative joint disease) 03/07/2012  . Fracture of femoral neck, right ([place]) 08/14/2017  . HTN (hypertension), benign 03/07/2012  Social History   Tobacco Use  . Smoking status: Former Smoker    Last attempt to quit: 08/19/1974    Years since quitting: 43.0  . Smokeless tobacco: Never Used  Substance Use Topics  . Alcohol use: No    Alcohol/week: 0.0 oz    Frequency: Never  . Drug use: No    No family history on file.  No Known Allergies  Physical Exam: Vitals:   09/06/17 0600 09/06/17 0700  BP: 112/61   Pulse: 62   Resp: (!) 23 19  Temp:  99.1 F (37.3 C)  SpO2: 100%    Constitutional: NAD,  appears comfortable HEENT: Atraumatic, normocephalic. PERRL, anicteric sclera.  Neck: Supple, trachea midline.  Cardiovascular: RRR Pulmonary/Chest: CTAB anteriorly  Abdominal: Soft, non tender, non distended. +BS.  Extremities: Warm and well perfused. Distal pulses intact. No edema. Multiple LE ulcers (see pictures below) Neurological: Alert to person, not place. CN II - XII grossly intact.  Skin: No rashes or erythema  Psychiatric: Normal mood and affect            Lab Results  Component Value Date   WBC 11.8 (H) 09/06/2017   HGB 9.0 (L) 09/06/2017   HCT 28.1 (L) 09/06/2017   MCV 90.9 09/06/2017   PLT 189 09/06/2017    Lab Results  Component Value Date   CREATININE 1.77 (H) 09/06/2017   BUN 44 (H) 09/06/2017   NA 148 (H) 09/06/2017   K 3.4 (L) 09/06/2017   CL 117 (H) 09/06/2017   CO2 21 (L) 09/06/2017    Lab Results  Component Value Date   ALT 18 09/06/2017   AST 26 09/06/2017   ALKPHOS 105 09/06/2017     Microbiology: Recent Results (from the past 240 hour(s))  Culture, blood (Routine x 2)     Status: None (Preliminary result)   Collection Time: 09/04/17  1:15 PM  Result Value Ref Range Status   Specimen Description BLOOD LEFT FOREARM  Final   Special Requests   Final    BOTTLES DRAWN AEROBIC AND ANAEROBIC Blood Culture adequate volume   Culture NO GROWTH 1 DAY  Final   Report Status PENDING  Incomplete  Culture, blood (Routine x 2)     Status: None (Preliminary result)   Collection Time: 09/04/17  1:23 PM  Result Value Ref Range Status   Specimen Description BLOOD ARM RIGHT  Final   Special Requests IN PEDIATRIC BOTTLE Blood Culture adequate volume  Final   Culture  Setup Time   Final    GRAM POSITIVE COCCI IN PEDIATRIC BOTTLE CRITICAL RESULT CALLED TO, READ BACK BY AND VERIFIED WITH: G ABBOTT PHARMD [id number] 09/06/17 A BROWNING    Culture GRAM POSITIVE COCCI  Final   Report Status PENDING  Incomplete  Blood Culture ID Panel (Reflexed)     Status:  Abnormal   Collection Time: 09/04/17  1:23 PM  Result Value Ref Range Status   Enterococcus species NOT DETECTED NOT DETECTED Final   Listeria monocytogenes NOT DETECTED NOT DETECTED Final   Staphylococcus species DETECTED (A) NOT DETECTED Final    Comment: CRITICAL RESULT CALLED TO, READ BACK BY AND VERIFIED WITH: G ABBOTT PHARMD 0149 09/06/17 A BROWNING    Staphylococcus aureus DETECTED (A) NOT DETECTED Final    Comment: Methicillin (oxacillin)-resistant Staphylococcus aureus (MRSA). MRSA is predictably resistant to beta-lactam antibiotics (except ceftaroline). Preferred therapy is vancomycin unless clinically contraindicated. Patient requires contact precautions if  hospitalized. CRITICAL RESULT CALLED TO, READ BACK BY  AND VERIFIED WITH: G ABBOTT PHARMD 0149 09/06/17 A BROWNING    Methicillin resistance DETECTED (A) NOT DETECTED Final    Comment: CRITICAL RESULT CALLED TO, READ BACK BY AND VERIFIED WITH: G ABBOTT PHARMD 0149 09/06/17 A BROWNING    Streptococcus species NOT DETECTED NOT DETECTED Final   Streptococcus agalactiae NOT DETECTED NOT DETECTED Final   Streptococcus pneumoniae NOT DETECTED NOT DETECTED Final   Streptococcus pyogenes NOT DETECTED NOT DETECTED Final   Acinetobacter baumannii NOT DETECTED NOT DETECTED Final   Enterobacteriaceae species NOT DETECTED NOT DETECTED Final   Enterobacter cloacae complex NOT DETECTED NOT DETECTED Final   Escherichia coli NOT DETECTED NOT DETECTED Final   Klebsiella oxytoca NOT DETECTED NOT DETECTED Final   Klebsiella pneumoniae NOT DETECTED NOT DETECTED Final   Proteus species NOT DETECTED NOT DETECTED Final   Serratia marcescens NOT DETECTED NOT DETECTED Final   Haemophilus influenzae NOT DETECTED NOT DETECTED Final   Neisseria meningitidis NOT DETECTED NOT DETECTED Final   Pseudomonas aeruginosa NOT DETECTED NOT DETECTED Final   Candida albicans NOT DETECTED NOT DETECTED Final   Candida glabrata NOT DETECTED NOT DETECTED Final    Candida krusei NOT DETECTED NOT DETECTED Final   Candida parapsilosis NOT DETECTED NOT DETECTED Final   Candida tropicalis NOT DETECTED NOT DETECTED Final  Urine culture     Status: None   Collection Time: 09/04/17  5:42 PM  Result Value Ref Range Status   Specimen Description URINE, RANDOM  Final   Special Requests NONE  Final   Culture NO GROWTH  Final   Report Status 09/05/2017 FINAL  Final  MRSA PCR Screening     Status: None   Collection Time: 09/04/17 10:11 PM  Result Value Ref Range Status   MRSA by PCR NEGATIVE NEGATIVE Final    Comment:        The GeneXpert MRSA Assay (FDA approved for NASAL specimens only), is one component of a comprehensive MRSA colonization surveillance program. It is not intended to diagnose MRSA infection nor to guide or monitor treatment for MRSA infections.     [name], MD - [facility] for Infectious Disease [facility] [email or web address] [phone] pager  [phone] cell 09/06/2017, 9:49 AM

## 2017-09-06 NOTE — Progress Notes (Signed)
Pharmacy Antibiotic Note  Garrett Snow is a 84 y.o. male admitted on 09/04/2017 with sepsis 2/2 pneumonia.  Pharmacy has been consulted for vancomycin dosing.  Cefepime was discontinued. Renal function continues to improve (Scr 2.8 on admit to 1.77 today; uop 0.8 mL/kg/min, CrCl ~26 mL/min). WBC continues to improve to 11.8. Afebrile. BCID came back positive with 1/2 bottles growing MRSA. Last dose of vancomycin was 1500 mg on 1/16 at 1558.  Plan: Changed vancomycin to 750 mg IV every 24 hours starting today. Goal trough 15-20 mcg/mL F/u renal fxn, trough @ SS, clinical resolution  Temp (24hrs), Avg:98.4 F (36.9 C), Min:97.4 F (36.3 C), Max:99.3 F (37.4 C)  Recent Labs  Lab 09/04/17 1315 09/04/17 1325 09/04/17 1749 09/04/17 2137 09/05/17 0115 09/05/17 0741 09/06/17 0357  WBC 20.4*  --   --   --  14.3* 14.3* 11.8*  CREATININE 2.80*  --   --   --  2.32* 2.21* 1.77*  LATICACIDVEN  --  1.62 1.74 2.0*  --   --   --     Estimated Creatinine Clearance: 26.4 mL/min (A) (by C-G formula based on SCr of 1.77 mg/dL (H)).    No Known Allergies  Antimicrobials this admission: Vanc 1/16 >>  Cefepime 1/16 >>1/17   Microbiology results: 1/16 blood x2>> 1/4 BCID MRSA 1/16 urine >> NG 1/16 MRSA PCR >> neg  Thank you for allowing pharmacy to be a part of this patient's care.  Doylene Canard, PharmD Clinical Pharmacist  Pager: [phone] Clinical Phone for 09/06/2017 until 3:30pm: [phone] If after 3:30pm, please call main pharmacy at [phone] 09/06/2017 11:02 AM

## 2017-09-06 NOTE — Progress Notes (Signed)
Patient seen and examined.  He is lethargic, does not really follow commands, is sleeping.  Both feet are in a cradle boots, the right hip has surgical wounds that appear clean, no drainage or redness or swelling, or evidence for infection.  He has MRSA bacteremia with sepsis, probably from his decubitus ulcers, I had a long discussion with his son [name], and the patient is very clear that he did not wish to exist in this type of situation, they discussed this over a year ago, and the family is strongly considering hospice care versus palliative care.  I am available to assist in ongoing communications and counseling if necessary, I certainly do not think there is anything further surgically that needs to be done, continue with wound care, antibiotics as indicated by infectious disease, and discussion with hospice.  Johnny Bridge, MD

## 2017-09-06 NOTE — Progress Notes (Addendum)
  Speech Language Pathology Treatment: Dysphagia  Patient Details Name: Garrett Snow MRN: [id number] DOB: Jan 09, 1919 Today's Date: 09/06/2017 Time: 1330-1340 SLP Time Calculation (min) (ACUTE ONLY): 10 min  Assessment / Plan / Recommendation Clinical Impression  F/u after yesterday's clinical swallow evaluation. Pt wearing dentures today - offered upgraded textures to determine if pt can progress to a different diet, but pt declined all solid foods.  With encouragement, he was willing to consume thin liquids at bedside. Min verbal cues for positioning, rate. No overt s/s of aspiration. Lung sounds are clear; afebrile.  Limited intake per RN. Will follow for safety/diet progression per pt's willingness.    HPI HPI: Garrett Snow a 84 y.o.malewith medical history significant ofHTN, DJD, dementia, Recent Right Femoral Neck Fx s/p Hip Arthroplasty 12/26, Hx of AAA who presented to [name] from SNF with AMS andincreased work of breathing. Initial CXR showed PNA and patient was started on Augmentin at SNF. Admitted for sepsis and AMS.CXR 1/16 aortic atherosclerosis. No acute cardiopulmonary abnormality seen. Head CT subacute to chronic right convexity subdural hematoma, measuring up to 10 mm in thickness without associated midline shift or other mass effect.       SLP Plan  Continue with current plan of care       Recommendations  Diet recommendations: Dysphagia 1 (puree);Thin liquid Liquids provided via: Cup;Straw Medication Administration: Whole meds with puree Supervision: Patient able to self feed;Staff to assist with self feeding Compensations: Small sips/bites;Slow rate                Oral Care Recommendations: Oral care BID Follow up Recommendations: None SLP Visit Diagnosis: Dysphagia, oral phase (R13.11) Plan: Continue with current plan of care       GO                Garrett Snow 09/06/2017, 1:43 PM

## 2017-09-07 ENCOUNTER — Inpatient Hospital Stay (HOSPITAL_COMMUNITY): Payer: Medicare Other

## 2017-09-07 DIAGNOSIS — R740 Nonspecific elevation of levels of transaminase and lactic acid dehydrogenase [LDH]: Secondary | ICD-10-CM

## 2017-09-07 DIAGNOSIS — R7401 Elevation of levels of liver transaminase levels: Secondary | ICD-10-CM

## 2017-09-07 DIAGNOSIS — R7989 Other specified abnormal findings of blood chemistry: Secondary | ICD-10-CM

## 2017-09-07 DIAGNOSIS — R778 Other specified abnormalities of plasma proteins: Secondary | ICD-10-CM

## 2017-09-07 DIAGNOSIS — R748 Abnormal levels of other serum enzymes: Secondary | ICD-10-CM

## 2017-09-07 LAB — BLOOD CULTURE ID PANEL (REFLEXED)
Acinetobacter baumannii: NOT DETECTED
CANDIDA PARAPSILOSIS: NOT DETECTED
Candida albicans: NOT DETECTED
Candida glabrata: NOT DETECTED
Candida krusei: NOT DETECTED
Candida tropicalis: NOT DETECTED
Enterobacter cloacae complex: NOT DETECTED
Enterobacteriaceae species: NOT DETECTED
Enterococcus species: NOT DETECTED
Escherichia coli: NOT DETECTED
HAEMOPHILUS INFLUENZAE: NOT DETECTED
KLEBSIELLA OXYTOCA: NOT DETECTED
KLEBSIELLA PNEUMONIAE: NOT DETECTED
Listeria monocytogenes: NOT DETECTED
METHICILLIN RESISTANCE: DETECTED — AB
Neisseria meningitidis: NOT DETECTED
Proteus species: NOT DETECTED
Pseudomonas aeruginosa: NOT DETECTED
SERRATIA MARCESCENS: NOT DETECTED
STAPHYLOCOCCUS AUREUS BCID: DETECTED — AB
STREPTOCOCCUS PYOGENES: NOT DETECTED
Staphylococcus species: DETECTED — AB
Streptococcus agalactiae: NOT DETECTED
Streptococcus pneumoniae: NOT DETECTED
Streptococcus species: NOT DETECTED

## 2017-09-07 LAB — CBC WITH DIFFERENTIAL/PLATELET
BASOS ABS: 0 10*3/uL (ref 0.0–0.1)
Basophils Relative: 0 %
Eosinophils Absolute: 0.1 10*3/uL (ref 0.0–0.7)
Eosinophils Relative: 1 %
HEMATOCRIT: 30.6 % — AB (ref 39.0–52.0)
HEMOGLOBIN: 9.6 g/dL — AB (ref 13.0–17.0)
LYMPHS PCT: 20 %
Lymphs Abs: 2 10*3/uL (ref 0.7–4.0)
MCH: 29.2 pg (ref 26.0–34.0)
MCHC: 31.4 g/dL (ref 30.0–36.0)
MCV: 93 fL (ref 78.0–100.0)
MONO ABS: 0.7 10*3/uL (ref 0.1–1.0)
MONOS PCT: 7 %
NEUTROS PCT: 72 %
Neutro Abs: 7 10*3/uL (ref 1.7–7.7)
Platelets: 187 10*3/uL (ref 150–400)
RBC: 3.29 MIL/uL — ABNORMAL LOW (ref 4.22–5.81)
RDW: 15.6 % — AB (ref 11.5–15.5)
WBC: 9.7 10*3/uL (ref 4.0–10.5)

## 2017-09-07 LAB — COMPREHENSIVE METABOLIC PANEL
ALK PHOS: 156 U/L — AB (ref 38–126)
ALT: 43 U/L (ref 17–63)
AST: 64 U/L — AB (ref 15–41)
Albumin: 1.8 g/dL — ABNORMAL LOW (ref 3.5–5.0)
Anion gap: 10 (ref 5–15)
BILIRUBIN TOTAL: 0.9 mg/dL (ref 0.3–1.2)
BUN: 34 mg/dL — AB (ref 6–20)
CO2: 20 mmol/L — ABNORMAL LOW (ref 22–32)
CREATININE: 1.4 mg/dL — AB (ref 0.61–1.24)
Calcium: 8.7 mg/dL — ABNORMAL LOW (ref 8.9–10.3)
Chloride: 115 mmol/L — ABNORMAL HIGH (ref 101–111)
GFR calc Af Amer: 47 mL/min — ABNORMAL LOW (ref >60)
GFR, EST NON AFRICAN AMERICAN: 41 mL/min — AB (ref >60)
GLUCOSE: 118 mg/dL — AB (ref 65–99)
POTASSIUM: 4 mmol/L (ref 3.5–5.1)
Sodium: 145 mmol/L (ref 135–145)
TOTAL PROTEIN: 4.7 g/dL — AB (ref 6.5–8.1)

## 2017-09-07 LAB — MAGNESIUM: MAGNESIUM: 2.1 mg/dL (ref 1.7–2.4)

## 2017-09-07 LAB — PHOSPHORUS: Phosphorus: 2.3 mg/dL — ABNORMAL LOW (ref 2.5–4.6)

## 2017-09-07 MED ORDER — K PHOS MONO-SOD PHOS DI & MONO 155-852-130 MG PO TABS
500.0000 mg | ORAL_TABLET | Freq: Two times a day (BID) | ORAL | Status: AC
  Administered 2017-09-07 – 2017-09-08 (×2): 500 mg via ORAL
  Filled 2017-09-07 (×2): qty 2

## 2017-09-07 NOTE — Progress Notes (Signed)
Patient transferred to 5W35 via bed from [facility].  Patient resting comfortably on bed; no complaints of pain and denies needs.  Skin assessed with 2nd RN, Sonia Baller and is in state that has been documented.  Patient oriented to room and unit rules. Call light within reach, bed in lowest position, and bed alarm on.

## 2017-09-07 NOTE — Care Management (Signed)
Garrett Snow, with [facility], called CM.  She states family has contacted her directly and she will be consulting with them today.

## 2017-09-07 NOTE — Progress Notes (Signed)
PROGRESS NOTE    Garrett Snow  VEH:[id number] DOB: 09-Oct-1918 DOA: 09/04/2017 PCP: [name], MD   Brief Narrative:  Garrett Snow is a 84 y.o. male with medical history significant of HTN, DJD, Recent Right Femoral Neck Fx s/p Hip Arthroplasty 12/26, Hx of AAA, and other comorbids who presented to [name] from [facility] with AMS and increased work of breathing. Per family patient at baseline has some dementia but is able to carry on a conversation to an extent. Patient's family noticed a decline yesterday morning and noticed that he was more confused than baseline. Work up performed at [facility] included a CXR which showed PNA and patient was started on Augmentin. Because of worsening confusion and Hypotension, he was sent to the ED for evaluation. Upon my evaluation patient is confused but is able to answer some questions. Denies any Pain. Patient's son is at bedside who states he has gotten worse. TRH was asked to admit for Sepsis and AMS. Patient is improving slightly and was more alert and responsive today. Patient's Blood Cx grew 1/2 MRSA so will repeat Blood Cx and likely narrow just to Vancomycin. Infectious Diseases was automatically consulted because of positive BCID. Blood Cultures remain persistently positive so will repeat Blood Cx in AM.   Assessment & Plan:   Active Problems:   AAA (abdominal aortic aneurysm) (HCC)   Fracture of femoral neck, right (HCC)   Hip fracture (HCC)   Sepsis (HCC)   Acute kidney injury superimposed on chronic kidney disease ([place])   Acute urinary retention   Localized swelling of both lower legs   Normocytic anemia   Leukocytosis   Pressure injury of skin   Protein-calorie malnutrition, severe   Hypokalemia   Hypophosphatemia  Sepsis from MRSA Bacteremia, and less likely HCAP, improving -Sepsis Protocol Initiated in ED -Admitted to SDU but hemodynamically stable to transfer to Telemetry  -Outside Facility felt as if he had a LLL Opacity  -Patient was  Hypotensive, has a Leukocytosis, Tachypenic and was diagnosed with a PNA at [facility] in [place] -Received 30 mL/kg Sepsis Protocol; Maintenance IVF with NS at 75 mL/hr; Changed to D5W given Hypernatremia and Hypercholremia; Now decreased rate to 50 mL/hr and will continue today as patient appears more fatigued  -Sepsis Physiology is improving  -Checked Procalcitonin and was 55.53 and Trend Lactic Acid Levels and last was 2.0 -Broad Spectrum Abx with IV Vancomycin change to IV Daptomycin by Infectious Diseases; IV Cefepime discontinued given +MRSA in blood  -Checked Urinalysis and was Negative and Urine Cx showed No Growth (but was on Abx); Obtain Sputum Cx -Blood Cx x2 obtained in ED showed MRSA; Repeat Blood Cx 09/06/17 still persistently positive  -Repeat Blood Cx in AM -WBC now improved and is normal -Infectious Diseases automatically consulted -Unclear source of Bacteremia; ECHO just done yesterday did not mention any vegetations but did show mildly thickened leaflets; Will not pursue TEE given Age and risk of Decompensation  -Will obtain Right Hip X-Ray to evaluate   Acute Encephalopathy suspect Infectious Etiology, improved -Ordered Head CT w/o Contrast and showed 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 -I called [name] in Neurosurgery who states he will review the scan and make recommendations; He recommended no intervention at this time and does not feel as if Patient's Subdural Hygroma is the cause of patient's encephalopathy  -SLP recommending Dysphagia 1 Puree Thin Liquids  -  Treat Suspected Infectious Etiology with Broad Spectrum Abx and changed to Daptomycin  -Seizure Precautions; Aspiration Precautions -Follow up on Cx's  -Patient's Hypernatremia improved some. Will give IVF Resuscitation and changed to D5W at lower rate and repeat in AM   Hypotension but Hx of  Hypertension -Hold All Antihypertensives including Amlodipine and Proscar -C/w IVF Resuscitation as above -BP remaining on softer side  AKI on CKD Stage 3 -Baseline Cr was 1.4-1.5 -BUN/Cr on Admission showed patient to be 53/2.80; Now improved to 34/1.40 -? Obstruction from Acute Urinary Retention;  -Obtained Renal U/S and showed No hydronephrosis. Echogenic mildly atrophic kidneys, compatible with acute on chronic nonspecific renal parenchymal disease. Simple bilateral renal cysts. Foley catheter in place within the minimally distended urinary bladder. Suggestion of mild diffuse bladder wall thickening and trabeculation, probably due to chronic bladder outlet obstruction -Bladder Scan showed 646 -Place Foley Catheter to Gravity; Will try Voiding Trial -Avoid Nephrotoxic Medications -C/w Maintenance IVF Rehydration as above -Repeat CMP in AM  Acute Urinary Retention -Bladder Scan showed 646 mL on Admission  -Checked Renal U/S and showed mild diffuse bladder wall thickening and trabeculation, probably due to chronic bladder outlet obstruction -Placed Foley Cathter and will try Voiding Trial today; If unsuccessful will leave Foley in Long term -Finasteride held because of Hypotension   Non-Occlusive Thrombus of the Inferior Vena Cava -Noted on Renal U/S and was at the level of the Right Renal Vein -Difficult situation and don't know if it is accute; Hesitant to Anticoagulate because of Falls and with Hx of Subdural hematoma -Discussed with [name] in Vascular who recommended to discuss with IR about appropriate Imaging Modalities and ? Of IVC Filter -Discussed Case with Dr. Wallene Dales who recommended no IVC filter because above the Renal Vein; Per [name] no appropriate imaging can be done as cannot use Contrast due to patient's Kidney Function. After discussion with [name] and review of the films, he cannot exclude tumor -For now we will just monitor the patient and  discuss with his family about findings   Recent Right Hip Femoral Neck Fx -S/p Right Hemiarthroplasty by [name] 12/26 -PT/OT to Evaluate and treat -Get Right Hip X-Ray  -C/w VTE Prophylaxis with Lovenox 30 mg sq daily  Bilateral Lower Extremity Swelling, improving  -Possibly due to poor Nutritional Status with low Albumin of 1.8 -Nutritionist Consulted and awaiting Recc's -Checked ECHOCardiogram and EF was 60-65% and had Grade 1 DD -LE Duplex Negative for DVT -Checked Urinalysis to r/o Nephrotic Syndrome and had no Protein   Bilateral LE Leg Wounds/Ulcers -WOC Nurse Consultation appreciated -Recommendations per [place] Nurse note  Normocytic Anemia -Patient's Hb/Hct was 9.6/30.2 on admission and went to 9.6/30.6 -Continue to Monitor for S/Sx of Bleeding and expect Dilutional Drop -Repeat CBC in AM  Hx of AAA -Last CT Abd/Pelvis 08/17/17 showed Ectatic Abdominal Aorta at risk for Aneurysm Development -Follow up as an Outpatient   Leukocytosis -Patient's WBC was 20.4 on admission and improved to 11.8 -Likely infectious in origin; C/w IV Abx as designated as above -Repeat CBC in AM   Hypernatremia/Hypercholremia, improving  -Patient's Na+ was still elevated at 148 and Chloride was 117 yesterday but improved today. Na+ 145 today and Chloride 115 Today  -C/w D5W at reduced rate to 50 mL/hr -Repeat CMP in AM  Hypokalemia -Patient's K+ was 4.0 this AM -Patient also receiving IVF Resuscitation with D5W + 20 mEQ KCl -Continue to Monitor and Replete as Necessary -Repeat K+ in AM  Hypophosphatemia -Patient's Phos Level was 2.3 -Replete with po KPhos 500 mg po BIDx 2 again -Continue to Monitor and Replete as Necessary -Repeat Phos Level in AM   Elevated Troponin -Patient's Troponin was elevated at 0.23 and trended down to 0.15 -Likely 2/2 to Sepsis and not indicative of ACS -Patient denies any CP  Elevated AST -Patient's AST went from 26 -> 64 -Likely in the  setting of IV Abx with Daptomycin -Continue to Monitor Closely -Repeat CMP in AM   DVT prophylaxis: Enoxaparin 30 mg sq q24h Code Status: DO NOT RESUSCITATE Family Communication: Discussed with Son at bedside  Disposition Plan: [facility] for further evaluation and workup  Consultants:   Discussed Case with Vascular Surgery [name]  Discussed Case with IR [name]  Infectious Diseases   Orthopedic [name]   Procedures:  ECHOCARDIOGRAM ------------------------------------------------------------------- Study Conclusions  - Left ventricle: The cavity size was normal. There was moderate   concentric hypertrophy. Systolic function was normal. The   estimated ejection fraction was in the range of 60% to 65%. Wall   motion was normal; there were no regional wall motion   abnormalities. Doppler parameters are consistent with abnormal   left ventricular relaxation (grade 1 diastolic dysfunction). - Aortic valve: There was no regurgitation. - Mitral valve: Calcified annulus. Mildly thickened leaflets .   There was moderate regurgitation. - Left atrium: The atrium was moderately dilated. - Right ventricle: The cavity size was normal. Wall thickness was   normal. Systolic function was normal. - Right atrium: The atrium was normal in size. - Tricuspid valve: There was mild regurgitation. - Pulmonary arteries: Systolic pressure was moderately increased.   PA peak pressure: 46 mm Hg (S). - Inferior vena cava: The vessel was normal in size.  LOWER EXTREMITY DUPLEX Negative for DVT.  Antimicrobials:  Anti-infectives (From admission, onward)   Start     Dose/Rate Route Frequency Ordered Stop   09/06/17 1600  vancomycin (VANCOCIN) IVPB 1000 mg/200 mL premix  Status:  Discontinued     1,000 mg 200 mL/hr over 60 Minutes Intravenous Every 48 hours 09/04/17 1528 09/06/17 1101   09/06/17 1400  vancomycin (VANCOCIN) IVPB 750 mg/150 ml premix  Status:  Discontinued     750  mg 150 mL/hr over 60 Minutes Intravenous Every 24 hours 09/06/17 1101 09/06/17 1149   09/06/17 1400  DAPTOmycin (CUBICIN) 611 mg in sodium chloride 0.9 % IVPB     8 mg/kg  76.4 kg 224.4 mL/hr over 30 Minutes Intravenous Every 48 hours 09/06/17 1149     09/05/17 1600  ceFEPIme (MAXIPIME) 1 g in dextrose 5 % 50 mL IVPB  Status:  Discontinued     1 g 100 mL/hr over 30 Minutes Intravenous Every 24 hours 09/04/17 1528 09/06/17 1003   09/04/17 1530  vancomycin (VANCOCIN) 1,500 mg in sodium chloride 0.9 % 500 mL IVPB     1,500 mg 250 mL/hr over 120 Minutes Intravenous STAT 09/04/17 1528 09/04/17 1911   09/04/17 1530  ceFEPIme (MAXIPIME) 2 g in dextrose 5 % 50 mL IVPB     2 g 100 mL/hr over 30 Minutes Intravenous STAT 09/04/17 1528 09/04/17 1713     Subjective: Seen and examined at bedside and was more lethargic and fatigued today than yesterday and was sleeping. No CP but remained a little confused. Son says he looked paler today and was not as interactive. No other complaints or concerns.   Objective: Vitals:   09/06/17 2323 09/07/17 0000 09/07/17  0157 09/07/17 0553  BP: (!) 117/58 (!) 111/56 (!) 113/58 (!) 109/49  Pulse: 77 (!) 37 76 (!) 59  Resp: (!) 23 (!) 26 20 (!) 22  Temp: 98.8 F (37.1 C)  98.5 F (36.9 C) (!) 97.5 F (36.4 C)  TempSrc: Oral  Oral Oral  SpO2: 94% 96% 96% 94%  Weight:   77.7 kg (171 lb 4.8 oz)   Height:        Intake/Output Summary (Last 24 hours) at 09/07/2017 1340 Last data filed at 09/07/2017 0930 Gross per 24 hour  Intake 933.05 ml  Output 975 ml  Net -41.95 ml   Filed Weights   09/04/17 2206 09/05/17 0309 09/07/17 0157  Weight: 73.9 kg (162 lb 14.7 oz) 76.4 kg (168 lb 6.9 oz) 77.7 kg (171 lb 4.8 oz)   Examination: Physical Exam:  Constitutional: Elderly Caucasian male who is more lethargic and somnolent today; No acute distress.  Eyes: Sclerae anicteric. Lids normal ENMT: External Ears and nose appear normal. MMM Neck: Supple with no  JVD Respiratory: Diminished breath sounds but unlabored breathing. No appreciable wheezing/rales/rhonchi  Cardiovascular: RRR; Has Mild to 1+ LE edema today Abdomen: Soft, NT, ND. Bowel sounds present GU: Deferred; Foley removed Musculoskeletal: No contractures; No cyanosis  Skin: Warm and dry; Has seborrheic keratosis spread throughout face and upper chest. Has bilateral LE pressure ulcers covered with Mepilex pads Neurologic: CN 2-12 grossly intact. No appreciable focal deficits; Does not follow commands to well  Psychiatric: Impaired judgement and insight. More somnolent but arousable  Data Reviewed: I have personally reviewed following labs and imaging studies  CBC: Recent Labs  Lab 09/04/17 1315 09/05/17 0115 09/05/17 0741 09/06/17 0357 09/07/17 0947  WBC 20.4* 14.3* 14.3* 11.8* 9.7  NEUTROABS 18.5* 12.6*  --  9.9* 7.0  HGB 9.6* 8.8* 9.0* 9.0* 9.6*  HCT 30.2* 27.0* 28.5* 28.1* 30.6*  MCV 93.2 92.2 92.5 90.9 93.0  PLT 185 171 177 189 176   Basic Metabolic Panel: Recent Labs  Lab 09/04/17 1315 09/05/17 0115 09/05/17 0741 09/06/17 0357 09/07/17 0947  NA 146* 147* 148* 148* 145  K 4.0 3.6 3.7 3.4* 4.0  CL 115* 117* 118* 117* 115*  CO2 23 20* 21* 21* 20*  GLUCOSE 130* 99 93 132* 118*  BUN 53* 52* 55* 44* 34*  CREATININE 2.80* 2.32* 2.21* 1.77* 1.40*  CALCIUM 8.5* 8.2* 8.5* 8.9 8.7*  MG  --  1.9  --  1.9 2.1  PHOS  --  3.8  --  2.4* 2.3*   GFR: Estimated Creatinine Clearance: 33.9 mL/min (A) (by C-G formula based on SCr of 1.4 mg/dL (H)). Liver Function Tests: Recent Labs  Lab 09/04/17 1315 09/05/17 0115 09/05/17 0741 09/06/17 0357 09/07/17 0947  AST 30 23 21 26  64*  ALT 20 18 16* 18 43  ALKPHOS 118 100 99 105 156*  BILITOT 0.9 0.9 1.1 0.7 0.9  PROT 4.7* 4.2* 4.4* 4.5* 4.7*  ALBUMIN 1.9* 1.7* 1.8* 1.7* 1.8*   No results for input(s): LIPASE, AMYLASE in the last 168 hours. No results for input(s): AMMONIA in the last 168 hours. Coagulation  Profile: Recent Labs  Lab 09/04/17 1315  INR 1.65   Cardiac Enzymes: Recent Labs  Lab 09/04/17 2137 09/05/17 0115 09/05/17 0741 09/06/17 0357  CKTOTAL  --   --   --  34*  TROPONINI 0.23* 0.20* 0.15*  --    BNP (last 3 results) No results for input(s): PROBNP in the last 8760 hours. HbA1C: No  results for input(s): HGBA1C in the last 72 hours. CBG: Recent Labs  Lab 09/04/17 1345 09/04/17 2103  GLUCAP 120* 114*   Lipid Profile: No results for input(s): CHOL, HDL, LDLCALC, TRIG, CHOLHDL, LDLDIRECT in the last 72 hours. Thyroid Function Tests: Recent Labs    09/04/17 2137  TSH 1.684   Anemia Panel: No results for input(s): VITAMINB12, FOLATE, FERRITIN, TIBC, IRON, RETICCTPCT in the last 72 hours. Sepsis Labs: Recent Labs  Lab 09/04/17 1325 09/04/17 1749 09/04/17 2137  PROCALCITON  --   --  55.53  LATICACIDVEN 1.62 1.74 2.0*    Recent Results (from the past 240 hour(s))  Culture, blood (Routine x 2)     Status: None (Preliminary result)   Collection Time: 09/04/17  1:15 PM  Result Value Ref Range Status   Specimen Description BLOOD LEFT FOREARM  Final   Special Requests   Final    BOTTLES DRAWN AEROBIC AND ANAEROBIC Blood Culture adequate volume   Culture NO GROWTH 2 DAYS  Final   Report Status PENDING  Incomplete  Culture, blood (Routine x 2)     Status: Abnormal (Preliminary result)   Collection Time: 09/04/17  1:23 PM  Result Value Ref Range Status   Specimen Description BLOOD ARM RIGHT  Final   Special Requests IN PEDIATRIC BOTTLE Blood Culture adequate volume  Final   Culture  Setup Time   Final    GRAM POSITIVE COCCI IN PEDIATRIC BOTTLE CRITICAL RESULT CALLED TO, READ BACK BY AND VERIFIED WITH: G ABBOTT PHARMD 0149 09/06/17 A BROWNING    Culture (A)  Final    STAPHYLOCOCCUS AUREUS SUSCEPTIBILITIES TO FOLLOW    Report Status PENDING  Incomplete  Blood Culture ID Panel (Reflexed)     Status: Abnormal   Collection Time: 09/04/17  1:23 PM  Result  Value Ref Range Status   Enterococcus species NOT DETECTED NOT DETECTED Final   Listeria monocytogenes NOT DETECTED NOT DETECTED Final   Staphylococcus species DETECTED (A) NOT DETECTED Final    Comment: CRITICAL RESULT CALLED TO, READ BACK BY AND VERIFIED WITH: G ABBOTT PHARMD 0149 09/06/17 A BROWNING    Staphylococcus aureus DETECTED (A) NOT DETECTED Final    Comment: Methicillin (oxacillin)-resistant Staphylococcus aureus (MRSA). MRSA is predictably resistant to beta-lactam antibiotics (except ceftaroline). Preferred therapy is vancomycin unless clinically contraindicated. Patient requires contact precautions if  hospitalized. CRITICAL RESULT CALLED TO, READ BACK BY AND VERIFIED WITH: G ABBOTT PHARMD 0149 09/06/17 A BROWNING    Methicillin resistance DETECTED (A) NOT DETECTED Final    Comment: CRITICAL RESULT CALLED TO, READ BACK BY AND VERIFIED WITH: G ABBOTT PHARMD 0149 09/06/17 A BROWNING    Streptococcus species NOT DETECTED NOT DETECTED Final   Streptococcus agalactiae NOT DETECTED NOT DETECTED Final   Streptococcus pneumoniae NOT DETECTED NOT DETECTED Final   Streptococcus pyogenes NOT DETECTED NOT DETECTED Final   Acinetobacter baumannii NOT DETECTED NOT DETECTED Final   Enterobacteriaceae species NOT DETECTED NOT DETECTED Final   Enterobacter cloacae complex NOT DETECTED NOT DETECTED Final   Escherichia coli NOT DETECTED NOT DETECTED Final   Klebsiella oxytoca NOT DETECTED NOT DETECTED Final   Klebsiella pneumoniae NOT DETECTED NOT DETECTED Final   Proteus species NOT DETECTED NOT DETECTED Final   Serratia marcescens NOT DETECTED NOT DETECTED Final   Haemophilus influenzae NOT DETECTED NOT DETECTED Final   Neisseria meningitidis NOT DETECTED NOT DETECTED Final   Pseudomonas aeruginosa NOT DETECTED NOT DETECTED Final   Candida albicans NOT DETECTED NOT DETECTED Final  Candida glabrata NOT DETECTED NOT DETECTED Final   Candida krusei NOT DETECTED NOT DETECTED Final   Candida  parapsilosis NOT DETECTED NOT DETECTED Final   Candida tropicalis NOT DETECTED NOT DETECTED Final  Urine culture     Status: None   Collection Time: 09/04/17  5:42 PM  Result Value Ref Range Status   Specimen Description URINE, RANDOM  Final   Special Requests NONE  Final   Culture NO GROWTH  Final   Report Status 09/05/2017 FINAL  Final  MRSA PCR Screening     Status: None   Collection Time: 09/04/17 10:11 PM  Result Value Ref Range Status   MRSA by PCR NEGATIVE NEGATIVE Final    Comment:        The GeneXpert MRSA Assay (FDA approved for NASAL specimens only), is one component of a comprehensive MRSA colonization surveillance program. It is not intended to diagnose MRSA infection nor to guide or monitor treatment for MRSA infections.   Culture, blood (routine x 2)     Status: None (Preliminary result)   Collection Time: 09/06/17  8:16 AM  Result Value Ref Range Status   Specimen Description BLOOD RIGHT HAND  Final   Special Requests IN PEDIATRIC BOTTLE Blood Culture adequate volume  Final   Culture  Setup Time   Final    GRAM POSITIVE COCCI IN PEDIATRIC BOTTLE CRITICAL RESULT CALLED TO, READ BACK BY AND VERIFIED WITH: G ABBOTT PHARMD 0023 09/07/17 A BROWNING    Culture   Final    GRAM POSITIVE COCCI CULTURE REINCUBATED FOR BETTER GROWTH    Report Status PENDING  Incomplete  Blood Culture ID Panel (Reflexed)     Status: Abnormal   Collection Time: 09/06/17  8:16 AM  Result Value Ref Range Status   Enterococcus species NOT DETECTED NOT DETECTED Final   Listeria monocytogenes NOT DETECTED NOT DETECTED Final   Staphylococcus species DETECTED (A) NOT DETECTED Final    Comment: CRITICAL RESULT CALLED TO, READ BACK BY AND VERIFIED WITH: G ABBOTT PHARMD 0023 09/07/17 A BROWNING    Staphylococcus aureus DETECTED (A) NOT DETECTED Final    Comment: Methicillin (oxacillin)-resistant Staphylococcus aureus (MRSA). MRSA is predictably resistant to beta-lactam antibiotics (except  ceftaroline). Preferred therapy is vancomycin unless clinically contraindicated. Patient requires contact precautions if  hospitalized. CRITICAL RESULT CALLED TO, READ BACK BY AND VERIFIED WITH: G ABBOTT PHARMD 0023 09/07/17 A BROWNING    Methicillin resistance DETECTED (A) NOT DETECTED Final    Comment: CRITICAL RESULT CALLED TO, READ BACK BY AND VERIFIED WITH: G ABBOTT PHARMD 0023 09/07/17 A BROWNING    Streptococcus species NOT DETECTED NOT DETECTED Final   Streptococcus agalactiae NOT DETECTED NOT DETECTED Final   Streptococcus pneumoniae NOT DETECTED NOT DETECTED Final   Streptococcus pyogenes NOT DETECTED NOT DETECTED Final   Acinetobacter baumannii NOT DETECTED NOT DETECTED Final   Enterobacteriaceae species NOT DETECTED NOT DETECTED Final   Enterobacter cloacae complex NOT DETECTED NOT DETECTED Final   Escherichia coli NOT DETECTED NOT DETECTED Final   Klebsiella oxytoca NOT DETECTED NOT DETECTED Final   Klebsiella pneumoniae NOT DETECTED NOT DETECTED Final   Proteus species NOT DETECTED NOT DETECTED Final   Serratia marcescens NOT DETECTED NOT DETECTED Final   Haemophilus influenzae NOT DETECTED NOT DETECTED Final   Neisseria meningitidis NOT DETECTED NOT DETECTED Final   Pseudomonas aeruginosa NOT DETECTED NOT DETECTED Final   Candida albicans NOT DETECTED NOT DETECTED Final   Candida glabrata NOT DETECTED NOT DETECTED Final  Candida krusei NOT DETECTED NOT DETECTED Final   Candida parapsilosis NOT DETECTED NOT DETECTED Final   Candida tropicalis NOT DETECTED NOT DETECTED Final    Radiology Studies: Dg Chest Port 1 View  Result Date: 09/06/2017 CLINICAL DATA:  Shortness of breath. EXAM: PORTABLE CHEST 1 VIEW COMPARISON:  09/04/2017 FINDINGS: There is no evidence of pulmonary edema, consolidation, pneumothorax, nodule or pleural fluid. The heart size is normal. Stable atherosclerosis and tortuosity of the thoracic aorta. IMPRESSION: No active disease. Electronically Signed    By: Aletta Edouard M.D.   On: 09/06/2017 09:44   Scheduled Meds: . chlorhexidine  15 mL Mouth Rinse BID  . cholecalciferol  2,000 Units Oral Daily  . collagenase   Topical Daily  . enoxaparin (LOVENOX) injection  30 mg Subcutaneous Q24H  . feeding supplement (ENSURE ENLIVE)  237 mL Oral BID BM  . feeding supplement (PRO-STAT SUGAR FREE 64)  30 mL Oral BID  . mouth rinse  15 mL Mouth Rinse q12n4p  . multivitamin with minerals  1 tablet Oral Daily  . senna-docusate  2 tablet Oral BID  . cyanocobalamin  1,000 mcg Oral Daily   Continuous Infusions: . DAPTOmycin (CUBICIN)  IV Stopped (09/06/17 1539)  . dextrose 5 % with KCl 20 mEq / L 20 mEq (09/07/17 0457)    LOS: 3 days   Kerney Elbe, DO [facility] Pager [phone]  If 7PM-7AM, please contact night-coverage [email or web address] Password [email or web address] 09/07/2017, 1:40 PM

## 2017-09-07 NOTE — Progress Notes (Signed)
PHARMACY - PHYSICIAN COMMUNICATION CRITICAL VALUE ALERT - BLOOD CULTURE IDENTIFICATION (BCID)  Garrett Snow is an 84 y.o. male who presented to [facility] on 09/04/2017 with a chief complaint of AMS/SOB  Assessment:   1/2 Blood cultures drawn 1/18 growing MRSA.  Antibiotics changed to Daptomycin as per ID recommendations  Name of physician (or Provider) Contacted:  NA  Current antibiotics:  Daptomycin  Changes to prescribed antibiotics recommended:  None   Results for orders placed or performed during the hospital encounter of 09/04/17  Blood Culture ID Panel (Reflexed) (Collected: 09/06/2017  8:16 AM)  Result Value Ref Range   Enterococcus species NOT DETECTED NOT DETECTED   Listeria monocytogenes NOT DETECTED NOT DETECTED   Staphylococcus species DETECTED (A) NOT DETECTED   Staphylococcus aureus DETECTED (A) NOT DETECTED   Methicillin resistance DETECTED (A) NOT DETECTED   Streptococcus species NOT DETECTED NOT DETECTED   Streptococcus agalactiae NOT DETECTED NOT DETECTED   Streptococcus pneumoniae NOT DETECTED NOT DETECTED   Streptococcus pyogenes NOT DETECTED NOT DETECTED   Acinetobacter baumannii NOT DETECTED NOT DETECTED   Enterobacteriaceae species NOT DETECTED NOT DETECTED   Enterobacter cloacae complex NOT DETECTED NOT DETECTED   Escherichia coli NOT DETECTED NOT DETECTED   Klebsiella oxytoca NOT DETECTED NOT DETECTED   Klebsiella pneumoniae NOT DETECTED NOT DETECTED   Proteus species NOT DETECTED NOT DETECTED   Serratia marcescens NOT DETECTED NOT DETECTED   Haemophilus influenzae NOT DETECTED NOT DETECTED   Neisseria meningitidis NOT DETECTED NOT DETECTED   Pseudomonas aeruginosa NOT DETECTED NOT DETECTED   Candida albicans NOT DETECTED NOT DETECTED   Candida glabrata NOT DETECTED NOT DETECTED   Candida krusei NOT DETECTED NOT DETECTED   Candida parapsilosis NOT DETECTED NOT DETECTED   Candida tropicalis NOT DETECTED NOT DETECTED    Caryl Pina 09/07/2017  1:38 AM

## 2017-09-07 NOTE — Progress Notes (Signed)
Received report from [place], [place].

## 2017-09-08 LAB — CBC WITH DIFFERENTIAL/PLATELET
Basophils Absolute: 0 10*3/uL (ref 0.0–0.1)
Basophils Relative: 0 %
Eosinophils Absolute: 0.1 10*3/uL (ref 0.0–0.7)
Eosinophils Relative: 2 %
HEMATOCRIT: 30.2 % — AB (ref 39.0–52.0)
Hemoglobin: 9.5 g/dL — ABNORMAL LOW (ref 13.0–17.0)
LYMPHS ABS: 1.4 10*3/uL (ref 0.7–4.0)
Lymphocytes Relative: 17 %
MCH: 29.6 pg (ref 26.0–34.0)
MCHC: 31.5 g/dL (ref 30.0–36.0)
MCV: 94.1 fL (ref 78.0–100.0)
MONOS PCT: 7 %
Monocytes Absolute: 0.6 10*3/uL (ref 0.1–1.0)
NEUTROS ABS: 6.2 10*3/uL (ref 1.7–7.7)
Neutrophils Relative %: 74 %
Platelets: 223 10*3/uL (ref 150–400)
RBC: 3.21 MIL/uL — ABNORMAL LOW (ref 4.22–5.81)
RDW: 15.9 % — ABNORMAL HIGH (ref 11.5–15.5)
WBC: 8.4 10*3/uL (ref 4.0–10.5)

## 2017-09-08 LAB — COMPREHENSIVE METABOLIC PANEL
ALBUMIN: 1.7 g/dL — AB (ref 3.5–5.0)
ALK PHOS: 178 U/L — AB (ref 38–126)
ALT: 46 U/L (ref 17–63)
ANION GAP: 9 (ref 5–15)
AST: 54 U/L — ABNORMAL HIGH (ref 15–41)
BILIRUBIN TOTAL: 0.8 mg/dL (ref 0.3–1.2)
BUN: 34 mg/dL — ABNORMAL HIGH (ref 6–20)
CALCIUM: 8.9 mg/dL (ref 8.9–10.3)
CO2: 23 mmol/L (ref 22–32)
CREATININE: 1.37 mg/dL — AB (ref 0.61–1.24)
Chloride: 114 mmol/L — ABNORMAL HIGH (ref 101–111)
GFR calc non Af Amer: 42 mL/min — ABNORMAL LOW (ref >60)
GFR, EST AFRICAN AMERICAN: 49 mL/min — AB (ref >60)
Glucose, Bld: 113 mg/dL — ABNORMAL HIGH (ref 65–99)
Potassium: 3.9 mmol/L (ref 3.5–5.1)
Sodium: 146 mmol/L — ABNORMAL HIGH (ref 135–145)
TOTAL PROTEIN: 4.6 g/dL — AB (ref 6.5–8.1)

## 2017-09-08 LAB — CULTURE, BLOOD (ROUTINE X 2)
Special Requests: ADEQUATE
Special Requests: ADEQUATE

## 2017-09-08 LAB — PHOSPHORUS: Phosphorus: 2.6 mg/dL (ref 2.5–4.6)

## 2017-09-08 LAB — MAGNESIUM: Magnesium: 2 mg/dL (ref 1.7–2.4)

## 2017-09-08 NOTE — Progress Notes (Signed)
PROGRESS NOTE    Garrett Snow  BJY:[id number] DOB: [birth year] DOA: 09/04/2017 PCP: [name], MD   Brief Narrative:  Garrett Snow is a 84 y.o. male with medical history significant of HTN, DJD, Recent Right Femoral Neck Fx s/p Hip Arthroplasty 12/26, Hx of AAA, and other comorbids who presented to [name] from [facility] with AMS and increased work of breathing. Per family patient at baseline has some dementia but is able to carry on a conversation to an extent. Patient's family noticed a decline yesterday morning and noticed that he was more confused than baseline. Work up performed at [facility] included a CXR which showed PNA and patient was started on Augmentin. Because of worsening confusion and Hypotension, he was sent to the ED for evaluation. Upon my evaluation patient is confused but is able to answer some questions. Denies any Pain. Patient's son is at bedside who states he has gotten worse. TRH was asked to admit for Sepsis and AMS. Patient is improving slightly and was more alert and responsive today. Patient's Blood Cx grew 1/2 MRSA so will repeat Blood Cx and likely narrow just to Vancomycin. Infectious Diseases was automatically consulted because of positive BCID. Blood Cultures remain persistently positive Blood Cx's were repeated this AM. Patient was more alert today.   Assessment & Plan:   Active Problems:   AAA (abdominal aortic aneurysm) (HCC)   Fracture of femoral neck, right (HCC)   Hip fracture (HCC)   Sepsis (HCC)   Acute kidney injury superimposed on chronic kidney disease ([place])   Acute urinary retention   Localized swelling of both lower legs   Normocytic anemia   Leukocytosis   Pressure injury of skin   Protein-calorie malnutrition, severe   Hypokalemia   Hypophosphatemia   Elevated troponin   Elevated AST (SGOT)  Sepsis from MRSA Bacteremia, and less likely HCAP, improving -Sepsis Protocol Initiated in ED -Admitted to SDU but hemodynamically stable to transfer to  Telemetry  -Outside Facility felt as if he had a LLL Opacity  -Patient was Hypotensive, has a Leukocytosis, Tachypenic and was diagnosed with a PNA at [facility] in [place] -Received 30 mL/kg Sepsis Protocol; Maintenance IVF with NS at 75 mL/hr; Changed to D5W given Hypernatremia and Hypercholremia; Now decreased rate to 50 mL/hr and will continue today as patient appears more fatigued  -Sepsis Physiology is improving  -Checked Procalcitonin and was 55.53 and Trend Lactic Acid Levels and last was 2.0 -Broad Spectrum Abx with IV Vancomycin change to IV Daptomycin by Infectious Diseases; IV Cefepime discontinued given +MRSA in blood  -Checked Urinalysis and was Negative and Urine Cx showed No Growth (but was on Abx); Obtain Sputum Cx -Blood Cx x2 obtained in ED showed MRSA; Repeat Blood Cx 09/06/17 still persistently positive  -Repeated Blood Cx this AM -WBC now improved and is normal at 8.4 -Infectious Diseases automatically consulted because of Positive BCID -Unclear source of Bacteremia; ECHO just done did not mention any vegetations but did show mildly thickened leaflets; -Will not pursue TEE given Age and risk of Decompensation  -Obtained Right Hip X-Ray to evaluate and showed Right hip replacement is in good position. No acute fractures or dislocations identified.  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 -I called [name] in Neurosurgery who states he will review the scan and make  recommendations; He recommended no intervention at this time and does not feel as if Patient's Subdural Hygroma is the cause of patient's encephalopathy  -SLP recommending Dysphagia 1 Puree Thin Liquids  -Treat Suspected Infectious Etiology with Broad Spectrum Abx and changed to Daptomycin  -Seizure  Precautions; Aspiration Precautions -Follow up on Cx's and they are Positive for MRSA; Repeat still pending  -Patient's Hypernatremia improved some. C/w D5W at lower rate and repeat CMP in in AM   Hypotension but Hx of Hypertension -Hold All Antihypertensives including Amlodipine and Proscar -C/w IVF Resuscitation as above -BP remaining on softer side and is 110/46  AKI on CKD Stage 3 -Baseline Cr was 1.4-1.5 -BUN/Cr on Admission showed patient to be 53/2.80; Now improved to 34/1.37 -? Obstruction from Acute Urinary Retention;  -Obtained Renal U/S and showed No hydronephrosis. Echogenic mildly atrophic kidneys, compatible with acute on chronic nonspecific renal parenchymal disease. Simple bilateral renal cysts. Foley catheter in place within the minimally distended urinary bladder. Suggestion of mild diffuse bladder wall thickening and trabeculation, probably due to chronic bladder outlet obstruction -Bladder Scan showed 646 -Place Foley Catheter to Gravity; Will try Voiding Trial and patient able to void; Condom Catheter now in place; Asked nurse to do PVR Bladder Scan  -Avoid Nephrotoxic Medications -C/w Maintenance IVF Rehydration as above -Repeat CMP in AM  Acute Urinary Retention, improved -Bladder Scan showed 646 mL on Admission  -Checked Renal U/S and showed mild diffuse bladder wall thickening and trabeculation, probably due to chronic bladder outlet obstruction -Placed Foley Cathter and will tried Voiding Trial 1/19; Wearing Condom Cath and able to void  -Finasteride held because of Hypotension   Non-Occlusive Thrombus of the Inferior Vena Cava -Noted on Renal U/S and was at the level of the Right Renal Vein -Difficult situation and don't know if it is accute; Hesitant to Anticoagulate because of Falls and with Hx of Subdural hematoma -Discussed with [name] in Vascular who recommended to discuss with IR about appropriate Imaging Modalities and ? Of IVC Filter -Discussed  Case with Dr. Wallene Dales who recommended no IVC filter because above the Renal Vein; Per [name] no appropriate imaging can be done as cannot use Contrast due to patient's Kidney Function. After discussion with [name] and review of the films, he cannot exclude tumor -For now we will just monitor the patient and discuss with his family about findings   Recent Right Hip Femoral Neck Fx -S/p Right Hemiarthroplasty by [name] 12/26 -PT/OT to Evaluate and treat -Obtained Right Hip X-Ray to evaluate and showed Right hip replacement is in good position. No acute fractures or dislocations identified. -C/w VTE Prophylaxis with Lovenox 30 mg sq daily  Bilateral Lower Extremity Swelling, improving  -Possibly due to poor Nutritional Status with low Albumin of 1.7 -Nutritionist Consulted and awaiting Recc's -Checked ECHOCardiogram and EF was 60-65% and had Grade 1 DD -LE Duplex Negative for DVT -Checked Urinalysis to r/o Nephrotic Syndrome and had no Protein   Bilateral LE Leg Wounds/Ulcers -WOC Nurse Consultation appreciated -Recommendations per [place] Nurse note  Normocytic Anemia -Patient's Hb/Hct was 9.6/30.2 on admission and stable at 9.5/30.2 -Continue to Monitor for S/Sx of Bleeding and expect Dilutional Drop -Repeat CBC in AM  Hx of AAA -Last CT Abd/Pelvis 08/17/17 showed Ectatic Abdominal Aorta at risk for Aneurysm Development -Follow up as an Outpatient   Leukocytosis, improved  -Patient's WBC was 20.4 on admission and improved to 8.4 -Likely infectious in origin; C/w IV Abx as designated  as above -Repeat CBC in AM   Hypernatremia/Hypercholremia, improving  -Na+ 146 today and Chloride 114 Today  -C/w D5W at reduced rate to 50 mL/hr -Repeat CMP in AM  Hypokalemia -Patient's K+ was 3.0 this AM -Patient also receiving IVF Resuscitation with D5W + 20 mEQ KCl -Continue to Monitor and Replete as Necessary -Repeat K+ in AM   Hypophosphatemia -Patient's Phos  Level was 2.3 and improved to 2.6 -Repleted with po KPhos 500 mg po BIDx 2 yesterday  -Continue to Monitor and Replete as Necessary -Repeat Phos Level in AM   Elevated Troponin, trended down -Patient's Troponin was elevated at 0.23 and trended down to 0.15 -Likely 2/2 to Sepsis and not indicative of ACS -Patient denies any CP  Elevated AST -Patient's AST went from 26 -> 64 -> 54 -Likely in the setting of IV Abx with Daptomycin -Continue to Monitor Closely -Repeat CMP in AM   DVT prophylaxis: Enoxaparin 30 mg sq q24h Code Status: DO NOT RESUSCITATE Family Communication: Discussed with Son at bedside  Disposition Plan: Remain Inpatient for further evaluation and workup  Consultants:   Discussed Case with Vascular Surgery [name]  Discussed Case with IR [name]  Infectious Diseases   Orthopedic [name]   Procedures:  ECHOCARDIOGRAM ------------------------------------------------------------------- Study Conclusions  - Left ventricle: The cavity size was normal. There was moderate   concentric hypertrophy. Systolic function was normal. The   estimated ejection fraction was in the range of 60% to 65%. Wall   motion was normal; there were no regional wall motion   abnormalities. Doppler parameters are consistent with abnormal   left ventricular relaxation (grade 1 diastolic dysfunction). - Aortic valve: There was no regurgitation. - Mitral valve: Calcified annulus. Mildly thickened leaflets .   There was moderate regurgitation. - Left atrium: The atrium was moderately dilated. - Right ventricle: The cavity size was normal. Wall thickness was   normal. Systolic function was normal. - Right atrium: The atrium was normal in size. - Tricuspid valve: There was mild regurgitation. - Pulmonary arteries: Systolic pressure was moderately increased.   PA peak pressure: 46 mm Hg (S). - Inferior vena cava: The vessel was normal in size.  LOWER EXTREMITY  DUPLEX Negative for DVT.  Antimicrobials:  Anti-infectives (From admission, onward)   Start     Dose/Rate Route Frequency Ordered Stop   09/06/17 1600  vancomycin (VANCOCIN) IVPB 1000 mg/200 mL premix  Status:  Discontinued     1,000 mg 200 mL/hr over 60 Minutes Intravenous Every 48 hours 09/04/17 1528 09/06/17 1101   09/06/17 1400  vancomycin (VANCOCIN) IVPB 750 mg/150 ml premix  Status:  Discontinued     750 mg 150 mL/hr over 60 Minutes Intravenous Every 24 hours 09/06/17 1101 09/06/17 1149   09/06/17 1400  DAPTOmycin (CUBICIN) 611 mg in sodium chloride 0.9 % IVPB     8 mg/kg  76.4 kg 224.4 mL/hr over 30 Minutes Intravenous Every 48 hours 09/06/17 1149     09/05/17 1600  ceFEPIme (MAXIPIME) 1 g in dextrose 5 % 50 mL IVPB  Status:  Discontinued     1 g 100 mL/hr over 30 Minutes Intravenous Every 24 hours 09/04/17 1528 09/06/17 1003   09/04/17 1530  vancomycin (VANCOCIN) 1,500 mg in sodium chloride 0.9 % 500 mL IVPB     1,500 mg 250 mL/hr over 120 Minutes Intravenous STAT 09/04/17 1528 09/04/17 1911   09/04/17 1530  ceFEPIme (MAXIPIME) 2 g in dextrose 5 % 50 mL IVPB  2 g 100 mL/hr over 30 Minutes Intravenous STAT 09/04/17 1528 09/04/17 1713     Subjective: Seen and examined at bedside and was more alert and conversive and wanted to eat. Denied any pain or SOB.   Objective: Vitals:   09/07/17 1505 09/07/17 2154 09/08/17 0500 09/08/17 0548  BP:  (!) 119/47  (!) 110/46  Pulse: 68 (!) 57  (!) 50  Resp:  14  13  Temp:  97.8 F (36.6 C)  97.7 F (36.5 C)  TempSrc:  Oral  Oral  SpO2: 92%     Weight:   75.6 kg (166 lb 10.7 oz)   Height:        Intake/Output Summary (Last 24 hours) at 09/08/2017 0902 Last data filed at 09/07/2017 1916 Gross per 24 hour  Intake 496.67 ml  Output 575 ml  Net -78.33 ml   Filed Weights   09/05/17 0309 09/07/17 0157 09/08/17 0500  Weight: 76.4 kg (168 lb 6.9 oz) 77.7 kg (171 lb 4.8 oz) 75.6 kg (166 lb 10.7 oz)   Examination: Physical  Exam:  Constitutional: Elderly Caucasian male who is awake and in NAD but asking for food.  ENMT: External Ears and nose appear normal. MMM Neck: Supple with no JVD Respiratory: Slightly diminished but unlabored breathing; Wearing supplemental O2 via [place] Cardiovascular: RRR; Mild 1+ LE edema Abdomen: Soft, NT, ND. Bowel sounds present GU: Deferred. Wearing condom cath Musculoskeletal: No contractures; No cyanosis Skin: Warm and dry. Has bilateral LE pressure ulcers that are covered. Has seborrheic keratosis on face and upper chest Neurologic: Hard of Hearing. CN 2-12 grossly intact Psychiatric: Impaired judgement and insight. Awake and more alert  Data Reviewed: I have personally reviewed following labs and imaging studies  CBC: Recent Labs  Lab 09/04/17 1315 09/05/17 0115 09/05/17 0741 09/06/17 0357 09/07/17 0947 09/08/17 0355  WBC 20.4* 14.3* 14.3* 11.8* 9.7 8.4  NEUTROABS 18.5* 12.6*  --  9.9* 7.0 6.2  HGB 9.6* 8.8* 9.0* 9.0* 9.6* 9.5*  HCT 30.2* 27.0* 28.5* 28.1* 30.6* 30.2*  MCV 93.2 92.2 92.5 90.9 93.0 94.1  PLT 185 171 177 189 187 440   Basic Metabolic Panel: Recent Labs  Lab 09/05/17 0115 09/05/17 0741 09/06/17 0357 09/07/17 0947 09/08/17 0355  NA 147* 148* 148* 145 146*  K 3.6 3.7 3.4* 4.0 3.9  CL 117* 118* 117* 115* 114*  CO2 20* 21* 21* 20* 23  GLUCOSE 99 93 132* 118* 113*  BUN 52* 55* 44* 34* 34*  CREATININE 2.32* 2.21* 1.77* 1.40* 1.37*  CALCIUM 8.2* 8.5* 8.9 8.7* 8.9  MG 1.9  --  1.9 2.1 2.0  PHOS 3.8  --  2.4* 2.3* 2.6   GFR: Estimated Creatinine Clearance: 33.7 mL/min (A) (by C-G formula based on SCr of 1.37 mg/dL (H)). Liver Function Tests: Recent Labs  Lab 09/05/17 0115 09/05/17 0741 09/06/17 0357 09/07/17 0947 09/08/17 0355  AST 23 21 26  64* 54*  ALT 18 16* 18 43 46  ALKPHOS 100 99 105 156* 178*  BILITOT 0.9 1.1 0.7 0.9 0.8  PROT 4.2* 4.4* 4.5* 4.7* 4.6*  ALBUMIN 1.7* 1.8* 1.7* 1.8* 1.7*   No results for input(s): LIPASE, AMYLASE  in the last 168 hours. No results for input(s): AMMONIA in the last 168 hours. Coagulation Profile: Recent Labs  Lab 09/04/17 1315  INR 1.65   Cardiac Enzymes: Recent Labs  Lab 09/04/17 2137 09/05/17 0115 09/05/17 0741 09/06/17 0357  CKTOTAL  --   --   --  34*  TROPONINI 0.23* 0.20* 0.15*  --    BNP (last 3 results) No results for input(s): PROBNP in the last 8760 hours. HbA1C: No results for input(s): HGBA1C in the last 72 hours. CBG: Recent Labs  Lab 09/04/17 1345 09/04/17 2103  GLUCAP 120* 114*   Lipid Profile: No results for input(s): CHOL, HDL, LDLCALC, TRIG, CHOLHDL, LDLDIRECT in the last 72 hours. Thyroid Function Tests: No results for input(s): TSH, T4TOTAL, FREET4, T3FREE, THYROIDAB in the last 72 hours. Anemia Panel: No results for input(s): VITAMINB12, FOLATE, FERRITIN, TIBC, IRON, RETICCTPCT in the last 72 hours. Sepsis Labs: Recent Labs  Lab 09/04/17 1325 09/04/17 1749 09/04/17 2137  PROCALCITON  --   --  55.53  LATICACIDVEN 1.62 1.74 2.0*    Recent Results (from the past 240 hour(s))  Culture, blood (Routine x 2)     Status: None (Preliminary result)   Collection Time: 09/04/17  1:15 PM  Result Value Ref Range Status   Specimen Description BLOOD LEFT FOREARM  Final   Special Requests   Final    BOTTLES DRAWN AEROBIC AND ANAEROBIC Blood Culture adequate volume   Culture NO GROWTH 3 DAYS  Final   Report Status PENDING  Incomplete  Culture, blood (Routine x 2)     Status: Abnormal (Preliminary result)   Collection Time: 09/04/17  1:23 PM  Result Value Ref Range Status   Specimen Description BLOOD ARM RIGHT  Final   Special Requests IN PEDIATRIC BOTTLE Blood Culture adequate volume  Final   Culture  Setup Time   Final    GRAM POSITIVE COCCI IN PEDIATRIC BOTTLE CRITICAL RESULT CALLED TO, READ BACK BY AND VERIFIED WITH: G ABBOTT PHARMD 0149 09/06/17 A BROWNING    Culture (A)  Final    STAPHYLOCOCCUS AUREUS SUSCEPTIBILITIES TO FOLLOW    Report  Status PENDING  Incomplete  Blood Culture ID Panel (Reflexed)     Status: Abnormal   Collection Time: 09/04/17  1:23 PM  Result Value Ref Range Status   Enterococcus species NOT DETECTED NOT DETECTED Final   Listeria monocytogenes NOT DETECTED NOT DETECTED Final   Staphylococcus species DETECTED (A) NOT DETECTED Final    Comment: CRITICAL RESULT CALLED TO, READ BACK BY AND VERIFIED WITH: G ABBOTT PHARMD 0149 09/06/17 A BROWNING    Staphylococcus aureus DETECTED (A) NOT DETECTED Final    Comment: Methicillin (oxacillin)-resistant Staphylococcus aureus (MRSA). MRSA is predictably resistant to beta-lactam antibiotics (except ceftaroline). Preferred therapy is vancomycin unless clinically contraindicated. Patient requires contact precautions if  hospitalized. CRITICAL RESULT CALLED TO, READ BACK BY AND VERIFIED WITH: G ABBOTT PHARMD 0149 09/06/17 A BROWNING    Methicillin resistance DETECTED (A) NOT DETECTED Final    Comment: CRITICAL RESULT CALLED TO, READ BACK BY AND VERIFIED WITH: G ABBOTT PHARMD 0149 09/06/17 A BROWNING    Streptococcus species NOT DETECTED NOT DETECTED Final   Streptococcus agalactiae NOT DETECTED NOT DETECTED Final   Streptococcus pneumoniae NOT DETECTED NOT DETECTED Final   Streptococcus pyogenes NOT DETECTED NOT DETECTED Final   Acinetobacter baumannii NOT DETECTED NOT DETECTED Final   Enterobacteriaceae species NOT DETECTED NOT DETECTED Final   Enterobacter cloacae complex NOT DETECTED NOT DETECTED Final   Escherichia coli NOT DETECTED NOT DETECTED Final   Klebsiella oxytoca NOT DETECTED NOT DETECTED Final   Klebsiella pneumoniae NOT DETECTED NOT DETECTED Final   Proteus species NOT DETECTED NOT DETECTED Final   Serratia marcescens NOT DETECTED NOT DETECTED Final   Haemophilus influenzae NOT DETECTED NOT DETECTED Final  Neisseria meningitidis NOT DETECTED NOT DETECTED Final   Pseudomonas aeruginosa NOT DETECTED NOT DETECTED Final   Candida albicans NOT DETECTED  NOT DETECTED Final   Candida glabrata NOT DETECTED NOT DETECTED Final   Candida krusei NOT DETECTED NOT DETECTED Final   Candida parapsilosis NOT DETECTED NOT DETECTED Final   Candida tropicalis NOT DETECTED NOT DETECTED Final  Urine culture     Status: None   Collection Time: 09/04/17  5:42 PM  Result Value Ref Range Status   Specimen Description URINE, RANDOM  Final   Special Requests NONE  Final   Culture NO GROWTH  Final   Report Status 09/05/2017 FINAL  Final  MRSA PCR Screening     Status: None   Collection Time: 09/04/17 10:11 PM  Result Value Ref Range Status   MRSA by PCR NEGATIVE NEGATIVE Final    Comment:        The GeneXpert MRSA Assay (FDA approved for NASAL specimens only), is one component of a comprehensive MRSA colonization surveillance program. It is not intended to diagnose MRSA infection nor to guide or monitor treatment for MRSA infections.   Culture, blood (routine x 2)     Status: None (Preliminary result)   Collection Time: 09/06/17  8:12 AM  Result Value Ref Range Status   Specimen Description BLOOD RIGHT FOREARM  Final   Special Requests   Final    BOTTLES DRAWN AEROBIC ONLY Blood Culture adequate volume   Culture NO GROWTH 1 DAY  Final   Report Status PENDING  Incomplete  Culture, blood (routine x 2)     Status: None (Preliminary result)   Collection Time: 09/06/17  8:16 AM  Result Value Ref Range Status   Specimen Description BLOOD RIGHT HAND  Final   Special Requests IN PEDIATRIC BOTTLE Blood Culture adequate volume  Final   Culture  Setup Time   Final    GRAM POSITIVE COCCI IN PEDIATRIC BOTTLE CRITICAL RESULT CALLED TO, READ BACK BY AND VERIFIED WITH: G ABBOTT PHARMD 0023 09/07/17 A BROWNING    Culture   Final    GRAM POSITIVE COCCI CULTURE REINCUBATED FOR BETTER GROWTH    Report Status PENDING  Incomplete  Blood Culture ID Panel (Reflexed)     Status: Abnormal   Collection Time: 09/06/17  8:16 AM  Result Value Ref Range Status    Enterococcus species NOT DETECTED NOT DETECTED Final   Listeria monocytogenes NOT DETECTED NOT DETECTED Final   Staphylococcus species DETECTED (A) NOT DETECTED Final    Comment: CRITICAL RESULT CALLED TO, READ BACK BY AND VERIFIED WITH: G ABBOTT PHARMD 0023 09/07/17 A BROWNING    Staphylococcus aureus DETECTED (A) NOT DETECTED Final    Comment: Methicillin (oxacillin)-resistant Staphylococcus aureus (MRSA). MRSA is predictably resistant to beta-lactam antibiotics (except ceftaroline). Preferred therapy is vancomycin unless clinically contraindicated. Patient requires contact precautions if  hospitalized. CRITICAL RESULT CALLED TO, READ BACK BY AND VERIFIED WITH: G ABBOTT PHARMD 0023 09/07/17 A BROWNING    Methicillin resistance DETECTED (A) NOT DETECTED Final    Comment: CRITICAL RESULT CALLED TO, READ BACK BY AND VERIFIED WITH: G ABBOTT PHARMD 0023 09/07/17 A BROWNING    Streptococcus species NOT DETECTED NOT DETECTED Final   Streptococcus agalactiae NOT DETECTED NOT DETECTED Final   Streptococcus pneumoniae NOT DETECTED NOT DETECTED Final   Streptococcus pyogenes NOT DETECTED NOT DETECTED Final   Acinetobacter baumannii NOT DETECTED NOT DETECTED Final   Enterobacteriaceae species NOT DETECTED NOT DETECTED Final   Enterobacter  cloacae complex NOT DETECTED NOT DETECTED Final   Escherichia coli NOT DETECTED NOT DETECTED Final   Klebsiella oxytoca NOT DETECTED NOT DETECTED Final   Klebsiella pneumoniae NOT DETECTED NOT DETECTED Final   Proteus species NOT DETECTED NOT DETECTED Final   Serratia marcescens NOT DETECTED NOT DETECTED Final   Haemophilus influenzae NOT DETECTED NOT DETECTED Final   Neisseria meningitidis NOT DETECTED NOT DETECTED Final   Pseudomonas aeruginosa NOT DETECTED NOT DETECTED Final   Candida albicans NOT DETECTED NOT DETECTED Final   Candida glabrata NOT DETECTED NOT DETECTED Final   Candida krusei NOT DETECTED NOT DETECTED Final   Candida parapsilosis NOT DETECTED  NOT DETECTED Final   Candida tropicalis NOT DETECTED NOT DETECTED Final    Radiology Studies: Dg Hip Unilat With Pelvis 2-3 Views Right  Result Date: 09/07/2017 CLINICAL DATA:  Shortness of breath.  Recent hip replacement. EXAM: DG HIP (WITH OR WITHOUT PELVIS) 2-3V RIGHT COMPARISON:  August 14, 2017 FINDINGS: Right hip replacement is in good position. No acute fractures or dislocations identified. IMPRESSION: Negative. Electronically Signed   By: Dorise Bullion III M.D   On: 09/07/2017 17:37   Scheduled Meds: . chlorhexidine  15 mL Mouth Rinse BID  . cholecalciferol  2,000 Units Oral Daily  . collagenase   Topical Daily  . enoxaparin (LOVENOX) injection  30 mg Subcutaneous Q24H  . feeding supplement (ENSURE ENLIVE)  237 mL Oral BID BM  . feeding supplement (PRO-STAT SUGAR FREE 64)  30 mL Oral BID  . mouth rinse  15 mL Mouth Rinse q12n4p  . multivitamin with minerals  1 tablet Oral Daily  . senna-docusate  2 tablet Oral BID  . cyanocobalamin  1,000 mcg Oral Daily   Continuous Infusions: . DAPTOmycin (CUBICIN)  IV Stopped (09/06/17 1539)  . dextrose 5 % with KCl 20 mEq / L 20 mEq (09/08/17 0610)    LOS: 4 days   Kerney Elbe, DO [facility] Pager [phone]  If 7PM-7AM, please contact night-coverage [email or web address] Password [email or web address] 09/08/2017, 9:02 AM

## 2017-09-09 DIAGNOSIS — Z96641 Presence of right artificial hip joint: Secondary | ICD-10-CM

## 2017-09-09 LAB — COMPREHENSIVE METABOLIC PANEL
ALBUMIN: 1.8 g/dL — AB (ref 3.5–5.0)
ALK PHOS: 189 U/L — AB (ref 38–126)
ALT: 62 U/L (ref 17–63)
AST: 76 U/L — AB (ref 15–41)
Anion gap: 10 (ref 5–15)
BILIRUBIN TOTAL: 0.7 mg/dL (ref 0.3–1.2)
BUN: 38 mg/dL — AB (ref 6–20)
CALCIUM: 8.9 mg/dL (ref 8.9–10.3)
CO2: 22 mmol/L (ref 22–32)
CREATININE: 1.45 mg/dL — AB (ref 0.61–1.24)
Chloride: 114 mmol/L — ABNORMAL HIGH (ref 101–111)
GFR calc Af Amer: 45 mL/min — ABNORMAL LOW (ref >60)
GFR, EST NON AFRICAN AMERICAN: 39 mL/min — AB (ref >60)
GLUCOSE: 114 mg/dL — AB (ref 65–99)
POTASSIUM: 4 mmol/L (ref 3.5–5.1)
Sodium: 146 mmol/L — ABNORMAL HIGH (ref 135–145)
TOTAL PROTEIN: 5 g/dL — AB (ref 6.5–8.1)

## 2017-09-09 LAB — CBC WITH DIFFERENTIAL/PLATELET
BASOS ABS: 0 10*3/uL (ref 0.0–0.1)
BASOS PCT: 0 %
Eosinophils Absolute: 0.1 10*3/uL (ref 0.0–0.7)
Eosinophils Relative: 1 %
HEMATOCRIT: 31.2 % — AB (ref 39.0–52.0)
HEMOGLOBIN: 10.2 g/dL — AB (ref 13.0–17.0)
LYMPHS PCT: 21 %
Lymphs Abs: 2.1 10*3/uL (ref 0.7–4.0)
MCH: 30.6 pg (ref 26.0–34.0)
MCHC: 32.7 g/dL (ref 30.0–36.0)
MCV: 93.7 fL (ref 78.0–100.0)
Monocytes Absolute: 0.8 10*3/uL (ref 0.1–1.0)
Monocytes Relative: 8 %
NEUTROS ABS: 7.2 10*3/uL (ref 1.7–7.7)
NEUTROS PCT: 70 %
Platelets: 259 10*3/uL (ref 150–400)
RBC: 3.33 MIL/uL — AB (ref 4.22–5.81)
RDW: 15.6 % — ABNORMAL HIGH (ref 11.5–15.5)
WBC: 10.4 10*3/uL (ref 4.0–10.5)

## 2017-09-09 LAB — MAGNESIUM: Magnesium: 2.1 mg/dL (ref 1.7–2.4)

## 2017-09-09 LAB — PHOSPHORUS: Phosphorus: 2.6 mg/dL (ref 2.5–4.6)

## 2017-09-09 MED ORDER — SODIUM CHLORIDE 0.9 % IV SOLN
750.0000 mg | INTRAVENOUS | Status: DC
  Administered 2017-09-10 – 2017-09-14 (×3): 750 mg via INTRAVENOUS
  Filled 2017-09-09 (×3): qty 15

## 2017-09-09 MED ORDER — SODIUM CHLORIDE 0.9 % IV SOLN: 10.0000 mg/kg | INTRAVENOUS | Status: DC

## 2017-09-09 NOTE — Progress Notes (Signed)
  Speech Language Pathology Treatment: Dysphagia  Patient Details Name: Garrett Snow MRN: [id number] DOB: 1918-09-24 Today's Date: 09/09/2017 Time: 4650-3546 SLP Time Calculation (min) (ACUTE ONLY): 21 min  Assessment / Plan / Recommendation Clinical Impression  Pt consumed thin via cup/straw, puree and cracker with encouragement and min verbal cues provided with oral holding and impaired mastication noted (slow but thorough) with solids; son educated re: aspiration risk related to cognitive deficits and safety with consumption during PO intake; recommend upgrading diet to Dysphagia 2/thin with general swallowing precautions/aspiration precautions in place; ST will f/u for further progression of diet/safety while in acute setting.   HPI HPI: Garrett Snow a 84 y.o.malewith medical history significant ofHTN, DJD, dementia, Recent Right Femoral Neck Fx s/p Hip Arthroplasty 12/26, Hx of AAA who presented to [name] with AMS andincreased work of breathing. Initial CXR showed PNA and patient was started on Augmentin at SNF. Admitted for sepsis and AMS.CXR 1/16 aortic atherosclerosis. No acute cardiopulmonary abnormality seen. Head CT subacute to chronic right convexity subdural hematoma, measuring up to 10 mm in thickness without associated midline shift or other mass effect.       SLP Plan  Continue with current plan of care       Recommendations  Diet recommendations: Dysphagia 2 (fine chop);Thin liquid Liquids provided via: Cup;Straw Medication Administration: Whole meds with puree Supervision: Patient able to self feed;Staff to assist with self feeding;Full supervision/cueing for compensatory strategies Compensations: Slow rate;Small sips/bites Postural Changes and/or Swallow Maneuvers: Seated upright 90 degrees                Oral Care Recommendations: Oral care BID Follow up Recommendations: None SLP Visit Diagnosis: Dysphagia, oral phase (R13.11) Plan:  Continue with current plan of care                      Elvina Sidle, M.S., CCC-SLP 09/09/2017, 5:13 PM

## 2017-09-09 NOTE — Progress Notes (Signed)
PHARMACY - PHYSICIAN COMMUNICATION CRITICAL VALUE ALERT - BLOOD CULTURE IDENTIFICATION (BCID)  Garrett Snow is an 84 y.o. male who presented to [facility] on 09/04/2017 .  Assessment:  Has MRSA bacteremia. Repeat cx on 1/20 still positive with GPC in clusters.   Name of physician (or Provider) Contacted: R. Comer  Current antibiotics: Daptomycin  Changes to prescribed antibiotics recommended:  No changes needed Will need repeat blood cx's again before placing PICC  Elenor Quinones, PharmD, [facility] Clinical Pharmacist Pager [phone] 09/09/2017 9:08 AM

## 2017-09-09 NOTE — Progress Notes (Signed)
PROGRESS NOTE    Garrett Snow  MGQ:[id number] DOB: 28-Feb-1919 DOA: 09/04/2017 PCP: [name], MD   Brief Narrative:  Garrett Snow is a 84 y.o. male with medical history significant of HTN, DJD, Recent Right Femoral Neck Fx s/p Hip Arthroplasty 12/26, Hx of AAA, and other comorbids who presented to [name] from [facility]F with AMS and increased work of breathing. Per family patient at baseline has some dementia but is able to carry on a conversation to an extent. Patient's family noticed a decline yesterday morning and noticed that he was more confused than baseline. Work up performed at [facility] included a CXR which showed PNA and patient was started on Augmentin. Because of worsening confusion and Hypotension, he was sent to the ED for evaluation. Upon my evaluation patient is confused but is able to answer some questions. Denies any Pain. TRH was asked to admit for Sepsis and AMS. Patient's Blood Cx grew 1/2 MRSA so will repeat Blood Cx and likely narrow just to Vancomycin and Infectious Diseases was automatically consulted because of positive BCID. ID Changed Abx to Vancomycin. Blood Cultures remain persistently positive and Blood Cx's were repeated yesterday AM grew out positive Cx. Blood Cx repeated again today as they are still positive. Patient more alert and responsive today.  Assessment & Plan:   Active Problems:   AAA (abdominal aortic aneurysm) (HCC)   Fracture of femoral neck, right (HCC)   Hip fracture (HCC)   Sepsis (HCC)   Acute kidney injury superimposed on chronic kidney disease ([place])   Acute urinary retention   Localized swelling of both lower legs   Normocytic anemia   Leukocytosis   Pressure injury of skin   Protein-calorie malnutrition, severe   Hypokalemia   Hypophosphatemia   Elevated troponin   Elevated AST (SGOT)  Sepsis from MRSA Bacteremia, and less likely HCAP, improving -Sepsis Protocol Initiated in ED -Admitted to SDU but hemodynamically stable to transfer to  Telemetry  -Outside Facility felt as if he had a LLL Opacity  -Patient was Hypotensive, has a Leukocytosis, Tachypenic and was diagnosed with a PNA at [facility] in [place] -Received 30 mL/kg Sepsis Protocol; Maintenance IVF with NS at 75 mL/hr; Changed to D5W given Hypernatremia and Hypercholremia; Now decreased rate to 50 mL/hr and will continue today as patient appears more fatigued  -Sepsis Physiology is improving  -Checked Procalcitonin and was 55.53 and Trend Lactic Acid Levels and last was 2.0 -Broad Spectrum Abx with IV Vancomycin change to IV Daptomycin by Infectious Diseases; IV Cefepime discontinued given +MRSA in blood  -Checked Urinalysis and was Negative and Urine Cx showed No Growth (but was on Abx); Obtain Sputum Cx -Blood Cx x2 obtained in ED showed MRSA; Repeat Blood Cx 09/06/17 and 1/20/19still persistently positive  -Repeated Blood Cx this AM per ID -WBC now improved and is normal at 10.4 -Infectious Diseases automatically consulted because of Positive BCID -Unclear source of Bacteremia; ECHO just done did not mention any vegetations but did show mildly thickened leaflets; -Will not pursue TEE given Age and risk of Decompensation  -Obtained Right Hip X-Ray to evaluate and showed Right hip replacement is in good position. No acute fractures or dislocations identified. -? 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 -I called Dr.  Cabbell in Neurosurgery who states he will review the scan and make recommendations; He recommended no intervention at this time and does not feel as if Patient's Subdural Hygroma is the cause of patient's encephalopathy  -SLP recommending Dysphagia 1 Puree Thin Liquids but after re-evaluation are  Recommending Dysphagia 2  -Treat Suspected Infectious Etiology with Broad Spectrum Abx and changed to Daptomycin  -Seizure Precautions; Aspiration Precautions -Follow up on Cx's and they are Positive for MRSA; Repeat Blood Cx this AM  -Patient's Hypernatremia improved some. C/w D5W at lower rate and repeat CMP in in AM   Hypotension but Hx of Hypertension -Hold All Antihypertensives including Amlodipine and Proscar -C/w IVF Resuscitation as above -BP remaining on softer side and is 110/46  AKI on CKD Stage 3 -Baseline Cr was 1.4-1.5 -BUN/Cr on Admission showed patient to be 53/2.80; Now improved to 38/1.45 and Baseline -? Obstruction from Acute Urinary Retention;  -Obtained Renal U/S and showed No hydronephrosis. Echogenic mildly atrophic kidneys, compatible with acute on chronic nonspecific renal parenchymal disease. Simple bilateral renal cysts. Foley catheter in place within the minimally distended urinary bladder. Suggestion of mild diffuse bladder wall thickening and trabeculation, probably due to chronic bladder outlet obstruction -Bladder Scan showed 646 -Place Foley Catheter to Gravity; Will try Voiding Trial and patient able to void; Condom Catheter now in place; Asked nurse to do PVR Bladder Scan  -Avoid Nephrotoxic Medications -C/w Maintenance IVF Rehydration as above -Repeat CMP in AM  Acute Urinary Retention, improved -Bladder Scan showed 646 mL on Admission  -Checked Renal U/S and showed mild diffuse bladder wall thickening and trabeculation, probably due to chronic bladder outlet obstruction -Placed Foley Cathter and will tried Voiding Trial 1/19; Wearing Condom Cath now and able to void  -Finasteride held because of Hypotension   Non-Occlusive Thrombus of the Inferior Vena Cava -Noted on Renal U/S and was at the level of the Right Renal Vein -Difficult situation and don't know if it is accute; Hesitant to Anticoagulate because of Falls and with Hx of Subdural  hematoma -Discussed with [name] in Vascular who recommended to discuss with IR about appropriate Imaging Modalities and ? Of IVC Filter -Discussed Case with Dr. Wallene Dales who recommended no IVC filter because above the Renal Vein; Per [name] no appropriate imaging can be done as cannot use Contrast due to patient's Kidney Function. After discussion with [name] and review of the films, he cannot exclude tumor -For now we will just monitor the patient and discuss with his family about findings   Recent Right Hip Femoral Neck Fx -S/p Right Hemiarthroplasty by [name] 12/26 -PT/OT to Evaluate and treat -Obtained Right Hip X-Ray to evaluate and showed Right hip replacement is in good position. No acute fractures or dislocations identified. -C/w VTE Prophylaxis with Lovenox 30 mg sq daily  Bilateral Lower Extremity Swelling, improving  -Possibly due to poor Nutritional Status with low Albumin of 1.8 -Nutritionist Consulted and awaiting Recc's -Checked ECHOCardiogram and EF was 60-65% and had Grade 1 DD -LE Duplex Negative for DVT -Checked Urinalysis to r/o Nephrotic Syndrome and had no Protein   Bilateral LE Leg Wounds/Ulcers -WOC Nurse Consultation appreciated -Recommendations per [place] Nurse note  Normocytic Anemia -Patient's Hb/Hct was 9.6/30.2 on admission and stable at 10.2/31.2 -Continue to Monitor for S/Sx of Bleeding and expect Dilutional Drop -Repeat CBC in AM  Hx of AAA -Last CT Abd/Pelvis 08/17/17 showed Ectatic Abdominal Aorta at risk for Aneurysm Development -Follow up as an Outpatient  Leukocytosis, improved  -Patient's WBC was 20.4 on admission and improved to 10.4 -Likely infectious in origin; C/w IV Abx as designated as above -Repeat CBC in AM   Hypernatremia/Hypercholremia, improving  -Na+ 146 today and Chloride 114 again Today  -C/w D5W at reduced rate to 50 mL/hr -Repeat CMP in AM  Hypokalemia -Patient's K+ was 4.0 this AM -Patient  also receiving IVF Resuscitation with D5W + 20 mEQ KCl -Continue to Monitor and Replete as Necessary -Repeat K+ in AM   Hypophosphatemia -Patient's Phos Level was 2.3 and improved to 2.6 -Continue to Monitor and Replete as Necessary -Repeat Phos Level in AM   Elevated Troponin, trended down -Patient's Troponin was elevated at 0.23 and trended down to 0.15 -Likely 2/2 to Sepsis and not indicative of ACS -Patient denies any CP  Elevated AST -Patient's AST went from 26 -> 64 -> 54 -> 76 -Likely in the setting of IV Abx with Daptomycin -Continue to Monitor Closely on Abx -Repeat CMP in AM   DVT prophylaxis: Enoxaparin 30 mg sq q24h Code Status: DO NOT RESUSCITATE Family Communication: Discussed with Sons at bedside  Disposition Plan: Remain Inpatient for further evaluation and workup  Consultants:   Discussed Case with Vascular Surgery [name]  Discussed Case with IR [name]  Infectious Diseases   Orthopedic [name]   Procedures:  ECHOCARDIOGRAM ------------------------------------------------------------------- Study Conclusions  - Left ventricle: The cavity size was normal. There was moderate   concentric hypertrophy. Systolic function was normal. The   estimated ejection fraction was in the range of 60% to 65%. Wall   motion was normal; there were no regional wall motion   abnormalities. Doppler parameters are consistent with abnormal   left ventricular relaxation (grade 1 diastolic dysfunction). - Aortic valve: There was no regurgitation. - Mitral valve: Calcified annulus. Mildly thickened leaflets .   There was moderate regurgitation. - Left atrium: The atrium was moderately dilated. - Right ventricle: The cavity size was normal. Wall thickness was   normal. Systolic function was normal. - Right atrium: The atrium was normal in size. - Tricuspid valve: There was mild regurgitation. - Pulmonary arteries: Systolic pressure was moderately increased.    PA peak pressure: 46 mm Hg (S). - Inferior vena cava: The vessel was normal in size.  LOWER EXTREMITY DUPLEX Negative for DVT.  Antimicrobials:  Anti-infectives (From admission, onward)   Start     Dose/Rate Route Frequency Ordered Stop   09/10/17 1400  DAPTOmycin (CUBICIN) 764 mg in sodium chloride 0.9 % IVPB  Status:  Discontinued     10 mg/kg  76.4 kg 224.4 mL/hr over 30 Minutes Intravenous Every 48 hours 09/09/17 1129 09/09/17 1406   09/10/17 1400  DAPTOmycin (CUBICIN) 750 mg in sodium chloride 0.9 % IVPB     750 mg 224.4 mL/hr over 30 Minutes Intravenous Every 48 hours 09/09/17 1406     09/06/17 1600  vancomycin (VANCOCIN) IVPB 1000 mg/200 mL premix  Status:  Discontinued     1,000 mg 200 mL/hr over 60 Minutes Intravenous Every 48 hours 09/04/17 1528 09/06/17 1101   09/06/17 1400  vancomycin (VANCOCIN) IVPB 750 mg/150 ml premix  Status:  Discontinued     750 mg 150 mL/hr over 60 Minutes Intravenous Every 24 hours 09/06/17 1101 09/06/17 1149   09/06/17 1400  DAPTOmycin (CUBICIN) 611 mg in sodium chloride 0.9 % IVPB  Status:  Discontinued     8 mg/kg  76.4 kg 224.4 mL/hr over 30 Minutes Intravenous Every 48  hours 09/06/17 1149 09/09/17 1129   09/05/17 1600  ceFEPIme (MAXIPIME) 1 g in dextrose 5 % 50 mL IVPB  Status:  Discontinued     1 g 100 mL/hr over 30 Minutes Intravenous Every 24 hours 09/04/17 1528 09/06/17 1003   09/04/17 1530  vancomycin (VANCOCIN) 1,500 mg in sodium chloride 0.9 % 500 mL IVPB     1,500 mg 250 mL/hr over 120 Minutes Intravenous STAT 09/04/17 1528 09/04/17 1911   09/04/17 1530  ceFEPIme (MAXIPIME) 2 g in dextrose 5 % 50 mL IVPB     2 g 100 mL/hr over 30 Minutes Intravenous STAT 09/04/17 1528 09/04/17 1713     Subjective: Seen and examined at bedside and was more alert. Stated he didn't hurt. No CP or SOB. Also stated he wasn't very hungry.    Objective: Vitals:   09/09/17 0140 09/09/17 0557 09/09/17 0815 09/09/17 1429  BP:  (!) 102/50 (!) 102/54  (!) 109/50  Pulse:  68  74  Resp:  14 16 16   Temp:  98.1 F (36.7 C) 98.3 F (36.8 C) 98.4 F (36.9 C)  TempSrc:  Oral Axillary Oral  SpO2:   97% 97%  Weight: 77.9 kg (171 lb 11.8 oz)     Height:        Intake/Output Summary (Last 24 hours) at 09/09/2017 2012 Last data filed at 09/09/2017 1900 Gross per 24 hour  Intake 780 ml  Output 400 ml  Net 380 ml   Filed Weights   09/07/17 0157 09/08/17 0500 09/09/17 0140  Weight: 77.7 kg (171 lb 4.8 oz) 75.6 kg (166 lb 10.7 oz) 77.9 kg (171 lb 11.8 oz)   Examination: Physical Exam:  Constitutional: Elderly Caucasian male who is awake and in NAD; Seems more interactive today Eyes: Sclerae anicteric. ENMT: External Ears and nose appear normal. MMM Neck: Supple with no JVD; Respiratory: Diminished but unlabored. Not wearing supplemental O2 today Cardiovascular: RRR; Trace LE edema today Abdomen: Soft, NT, ND. Bowel sounds present GU: Deferred. Wearing Condom Catheterization Musculoskeletal: No contractures; No cyanosis Skin: Warm and dry. Has LE pressure ulcers. Has seborrheic keratosis diffusely scatter Neurologic: CN 2-12 grossly intact. Psychiatric: Awake and alert. Normal mood and affect  Data Reviewed: I have personally reviewed following labs and imaging studies  CBC: Recent Labs  Lab 09/05/17 0115 09/05/17 0741 09/06/17 0357 09/07/17 0947 09/08/17 0355 09/09/17 0340  WBC 14.3* 14.3* 11.8* 9.7 8.4 10.4  NEUTROABS 12.6*  --  9.9* 7.0 6.2 7.2  HGB 8.8* 9.0* 9.0* 9.6* 9.5* 10.2*  HCT 27.0* 28.5* 28.1* 30.6* 30.2* 31.2*  MCV 92.2 92.5 90.9 93.0 94.1 93.7  PLT 171 177 189 187 223 235   Basic Metabolic Panel: Recent Labs  Lab 09/05/17 0115 09/05/17 0741 09/06/17 0357 09/07/17 0947 09/08/17 0355 09/09/17 0340  NA 147* 148* 148* 145 146* 146*  K 3.6 3.7 3.4* 4.0 3.9 4.0  CL 117* 118* 117* 115* 114* 114*  CO2 20* 21* 21* 20* 23 22  GLUCOSE 99 93 132* 118* 113* 114*  BUN 52* 55* 44* 34* 34* 38*  CREATININE 2.32*  2.21* 1.77* 1.40* 1.37* 1.45*  CALCIUM 8.2* 8.5* 8.9 8.7* 8.9 8.9  MG 1.9  --  1.9 2.1 2.0 2.1  PHOS 3.8  --  2.4* 2.3* 2.6 2.6   GFR: Estimated Creatinine Clearance: 32.7 mL/min (A) (by C-G formula based on SCr of 1.45 mg/dL (H)). Liver Function Tests: Recent Labs  Lab 09/05/17 0741 09/06/17 0357 09/07/17 0947 09/08/17 0355 09/09/17  0340  AST 21 26 64* 54* 76*  ALT 16* 18 43 46 62  ALKPHOS 99 105 156* 178* 189*  BILITOT 1.1 0.7 0.9 0.8 0.7  PROT 4.4* 4.5* 4.7* 4.6* 5.0*  ALBUMIN 1.8* 1.7* 1.8* 1.7* 1.8*   No results for input(s): LIPASE, AMYLASE in the last 168 hours. No results for input(s): AMMONIA in the last 168 hours. Coagulation Profile: Recent Labs  Lab 09/04/17 1315  INR 1.65   Cardiac Enzymes: Recent Labs  Lab 09/04/17 2137 09/05/17 0115 09/05/17 0741 09/06/17 0357  CKTOTAL  --   --   --  34*  TROPONINI 0.23* 0.20* 0.15*  --    BNP (last 3 results) No results for input(s): PROBNP in the last 8760 hours. HbA1C: No results for input(s): HGBA1C in the last 72 hours. CBG: Recent Labs  Lab 09/04/17 1345 09/04/17 2103  GLUCAP 120* 114*   Lipid Profile: No results for input(s): CHOL, HDL, LDLCALC, TRIG, CHOLHDL, LDLDIRECT in the last 72 hours. Thyroid Function Tests: No results for input(s): TSH, T4TOTAL, FREET4, T3FREE, THYROIDAB in the last 72 hours. Anemia Panel: No results for input(s): VITAMINB12, FOLATE, FERRITIN, TIBC, IRON, RETICCTPCT in the last 72 hours. Sepsis Labs: Recent Labs  Lab 09/04/17 1325 09/04/17 1749 09/04/17 2137  PROCALCITON  --   --  55.53  LATICACIDVEN 1.62 1.74 2.0*    Recent Results (from the past 240 hour(s))  Culture, blood (Routine x 2)     Status: Abnormal (Preliminary result)   Collection Time: 09/04/17  1:15 PM  Result Value Ref Range Status   Specimen Description BLOOD LEFT FOREARM  Final   Special Requests   Final    BOTTLES DRAWN AEROBIC AND ANAEROBIC Blood Culture adequate volume   Culture  Setup Time    Final    GRAM POSITIVE COCCI AEROBIC BOTTLE ONLY CRITICAL VALUE NOTED.  VALUE IS CONSISTENT WITH PREVIOUSLY REPORTED AND CALLED VALUE.    Culture STAPHYLOCOCCUS AUREUS (A)  Final   Report Status PENDING  Incomplete  Culture, blood (Routine x 2)     Status: Abnormal   Collection Time: 09/04/17  1:23 PM  Result Value Ref Range Status   Specimen Description BLOOD ARM RIGHT  Final   Special Requests IN PEDIATRIC BOTTLE Blood Culture adequate volume  Final   Culture  Setup Time   Final    GRAM POSITIVE COCCI IN PEDIATRIC BOTTLE CRITICAL RESULT CALLED TO, READ BACK BY AND VERIFIED WITH: G ABBOTT PHARMD 0149 09/06/17 A BROWNING    Culture METHICILLIN RESISTANT STAPHYLOCOCCUS AUREUS (A)  Final   Report Status 09/08/2017 FINAL  Final   Organism ID, Bacteria METHICILLIN RESISTANT STAPHYLOCOCCUS AUREUS  Final      Susceptibility   Methicillin resistant staphylococcus aureus - MIC*    CIPROFLOXACIN >=8 RESISTANT Resistant     ERYTHROMYCIN <=0.25 SENSITIVE Sensitive     GENTAMICIN <=0.5 SENSITIVE Sensitive     OXACILLIN RESISTANT Resistant     TETRACYCLINE <=1 SENSITIVE Sensitive     VANCOMYCIN 1 SENSITIVE Sensitive     TRIMETH/SULFA <=10 SENSITIVE Sensitive     CLINDAMYCIN <=0.25 SENSITIVE Sensitive     RIFAMPIN <=0.5 SENSITIVE Sensitive     Inducible Clindamycin NEGATIVE Sensitive     * METHICILLIN RESISTANT STAPHYLOCOCCUS AUREUS  Blood Culture ID Panel (Reflexed)     Status: Abnormal   Collection Time: 09/04/17  1:23 PM  Result Value Ref Range Status   Enterococcus species NOT DETECTED NOT DETECTED Final   Listeria monocytogenes NOT  DETECTED NOT DETECTED Final   Staphylococcus species DETECTED (A) NOT DETECTED Final    Comment: CRITICAL RESULT CALLED TO, READ BACK BY AND VERIFIED WITH: G ABBOTT PHARMD 0149 09/06/17 A BROWNING    Staphylococcus aureus DETECTED (A) NOT DETECTED Final    Comment: Methicillin (oxacillin)-resistant Staphylococcus aureus (MRSA). MRSA is predictably resistant  to beta-lactam antibiotics (except ceftaroline). Preferred therapy is vancomycin unless clinically contraindicated. Patient requires contact precautions if  hospitalized. CRITICAL RESULT CALLED TO, READ BACK BY AND VERIFIED WITH: G ABBOTT PHARMD 0149 09/06/17 A BROWNING    Methicillin resistance DETECTED (A) NOT DETECTED Final    Comment: CRITICAL RESULT CALLED TO, READ BACK BY AND VERIFIED WITH: G ABBOTT PHARMD 0149 09/06/17 A BROWNING    Streptococcus species NOT DETECTED NOT DETECTED Final   Streptococcus agalactiae NOT DETECTED NOT DETECTED Final   Streptococcus pneumoniae NOT DETECTED NOT DETECTED Final   Streptococcus pyogenes NOT DETECTED NOT DETECTED Final   Acinetobacter baumannii NOT DETECTED NOT DETECTED Final   Enterobacteriaceae species NOT DETECTED NOT DETECTED Final   Enterobacter cloacae complex NOT DETECTED NOT DETECTED Final   Escherichia coli NOT DETECTED NOT DETECTED Final   Klebsiella oxytoca NOT DETECTED NOT DETECTED Final   Klebsiella pneumoniae NOT DETECTED NOT DETECTED Final   Proteus species NOT DETECTED NOT DETECTED Final   Serratia marcescens NOT DETECTED NOT DETECTED Final   Haemophilus influenzae NOT DETECTED NOT DETECTED Final   Neisseria meningitidis NOT DETECTED NOT DETECTED Final   Pseudomonas aeruginosa NOT DETECTED NOT DETECTED Final   Candida albicans NOT DETECTED NOT DETECTED Final   Candida glabrata NOT DETECTED NOT DETECTED Final   Candida krusei NOT DETECTED NOT DETECTED Final   Candida parapsilosis NOT DETECTED NOT DETECTED Final   Candida tropicalis NOT DETECTED NOT DETECTED Final  Urine culture     Status: None   Collection Time: 09/04/17  5:42 PM  Result Value Ref Range Status   Specimen Description URINE, RANDOM  Final   Special Requests NONE  Final   Culture NO GROWTH  Final   Report Status 09/05/2017 FINAL  Final  MRSA PCR Screening     Status: None   Collection Time: 09/04/17 10:11 PM  Result Value Ref Range Status   MRSA by PCR  NEGATIVE NEGATIVE Final    Comment:        The GeneXpert MRSA Assay (FDA approved for NASAL specimens only), is one component of a comprehensive MRSA colonization surveillance program. It is not intended to diagnose MRSA infection nor to guide or monitor treatment for MRSA infections.   Culture, blood (routine x 2)     Status: None (Preliminary result)   Collection Time: 09/06/17  8:12 AM  Result Value Ref Range Status   Specimen Description BLOOD RIGHT FOREARM  Final   Special Requests   Final    BOTTLES DRAWN AEROBIC ONLY Blood Culture adequate volume   Culture NO GROWTH 3 DAYS  Final   Report Status PENDING  Incomplete  Culture, blood (routine x 2)     Status: Abnormal   Collection Time: 09/06/17  8:16 AM  Result Value Ref Range Status   Specimen Description BLOOD RIGHT HAND  Final   Special Requests IN PEDIATRIC BOTTLE Blood Culture adequate volume  Final   Culture  Setup Time   Final    GRAM POSITIVE COCCI IN PEDIATRIC BOTTLE CRITICAL RESULT CALLED TO, READ BACK BY AND VERIFIED WITH: G ABBOTT PHARMD 0023 09/07/17 A BROWNING    Culture (A)  Final    STAPHYLOCOCCUS AUREUS SUSCEPTIBILITIES PERFORMED ON PREVIOUS CULTURE WITHIN THE LAST 5 DAYS.    Report Status 09/08/2017 FINAL  Final  Blood Culture ID Panel (Reflexed)     Status: Abnormal   Collection Time: 09/06/17  8:16 AM  Result Value Ref Range Status   Enterococcus species NOT DETECTED NOT DETECTED Final   Listeria monocytogenes NOT DETECTED NOT DETECTED Final   Staphylococcus species DETECTED (A) NOT DETECTED Final    Comment: CRITICAL RESULT CALLED TO, READ BACK BY AND VERIFIED WITH: G ABBOTT PHARMD 0023 09/07/17 A BROWNING    Staphylococcus aureus DETECTED (A) NOT DETECTED Final    Comment: Methicillin (oxacillin)-resistant Staphylococcus aureus (MRSA). MRSA is predictably resistant to beta-lactam antibiotics (except ceftaroline). Preferred therapy is vancomycin unless clinically contraindicated. Patient requires  contact precautions if  hospitalized. CRITICAL RESULT CALLED TO, READ BACK BY AND VERIFIED WITH: G ABBOTT PHARMD 0023 09/07/17 A BROWNING    Methicillin resistance DETECTED (A) NOT DETECTED Final    Comment: CRITICAL RESULT CALLED TO, READ BACK BY AND VERIFIED WITH: G ABBOTT PHARMD 0023 09/07/17 A BROWNING    Streptococcus species NOT DETECTED NOT DETECTED Final   Streptococcus agalactiae NOT DETECTED NOT DETECTED Final   Streptococcus pneumoniae NOT DETECTED NOT DETECTED Final   Streptococcus pyogenes NOT DETECTED NOT DETECTED Final   Acinetobacter baumannii NOT DETECTED NOT DETECTED Final   Enterobacteriaceae species NOT DETECTED NOT DETECTED Final   Enterobacter cloacae complex NOT DETECTED NOT DETECTED Final   Escherichia coli NOT DETECTED NOT DETECTED Final   Klebsiella oxytoca NOT DETECTED NOT DETECTED Final   Klebsiella pneumoniae NOT DETECTED NOT DETECTED Final   Proteus species NOT DETECTED NOT DETECTED Final   Serratia marcescens NOT DETECTED NOT DETECTED Final   Haemophilus influenzae NOT DETECTED NOT DETECTED Final   Neisseria meningitidis NOT DETECTED NOT DETECTED Final   Pseudomonas aeruginosa NOT DETECTED NOT DETECTED Final   Candida albicans NOT DETECTED NOT DETECTED Final   Candida glabrata NOT DETECTED NOT DETECTED Final   Candida krusei NOT DETECTED NOT DETECTED Final   Candida parapsilosis NOT DETECTED NOT DETECTED Final   Candida tropicalis NOT DETECTED NOT DETECTED Final  Culture, blood (routine x 2)     Status: None (Preliminary result)   Collection Time: 09/08/17  9:20 AM  Result Value Ref Range Status   Specimen Description BLOOD RIGHT HAND  Final   Special Requests IN PEDIATRIC BOTTLE Blood Culture adequate volume  Final   Culture  Setup Time   Final    GRAM POSITIVE COCCI IN CLUSTERS IN PEDIATRIC BOTTLE CRITICAL RESULT CALLED TO, READ BACK BY AND VERIFIED WITH: Sandria Bales [id number] [phone]    Culture GRAM POSITIVE COCCI  Final   Report Status  PENDING  Incomplete  Culture, blood (routine x 2)     Status: None (Preliminary result)   Collection Time: 09/08/17  9:28 AM  Result Value Ref Range Status   Specimen Description BLOOD LEFT HAND  Final   Special Requests IN PEDIATRIC BOTTLE Blood Culture adequate volume  Final   Culture NO GROWTH 1 DAY  Final   Report Status PENDING  Incomplete    Radiology Studies: No results found. Scheduled Meds: . chlorhexidine  15 mL Mouth Rinse BID  . cholecalciferol  2,000 Units Oral Daily  . collagenase   Topical Daily  . enoxaparin (LOVENOX) injection  30 mg Subcutaneous Q24H  . feeding supplement (ENSURE ENLIVE)  237 mL Oral BID BM  . feeding supplement (  PRO-STAT SUGAR FREE 64)  30 mL Oral BID  . mouth rinse  15 mL Mouth Rinse q12n4p  . multivitamin with minerals  1 tablet Oral Daily  . senna-docusate  2 tablet Oral BID  . cyanocobalamin  1,000 mcg Oral Daily   Continuous Infusions: . [START ON 09/10/2017] DAPTOmycin (CUBICIN)  IV    . dextrose 5 % with KCl 20 mEq / L 20 mEq (09/09/17 0152)    LOS: 5 days   [place] [place], DO [facility] Pager [phone]  If 7PM-7AM, please contact night-coverage [email or web address] Password [facility] 09/09/2017, 8:12 PM

## 2017-09-09 NOTE — Consult Note (Signed)
[place] for Infectious Disease   Reason for Consult: MRSA Bacteremia     Referring Physician: Dr. Alfredia Ferguson   Active Problems:   AAA (abdominal aortic aneurysm) ([place])   Fracture of femoral neck, right (HCC)   Hip fracture ([place])   Sepsis ([place])   Acute kidney injury superimposed on chronic kidney disease ([place])   Acute urinary retention   Localized swelling of both lower legs   Normocytic anemia   Leukocytosis   Pressure injury of skin   Protein-calorie malnutrition, severe   Hypokalemia   Hypophosphatemia   Elevated troponin   Elevated AST (SGOT)   . chlorhexidine  15 mL Mouth Rinse BID  . cholecalciferol  2,000 Units Oral Daily  . collagenase   Topical Daily  . enoxaparin (LOVENOX) injection  30 mg Subcutaneous Q24H  . feeding supplement (ENSURE ENLIVE)  237 mL Oral BID BM  . feeding supplement (PRO-STAT SUGAR FREE 64)  30 mL Oral BID  . mouth rinse  15 mL Mouth Rinse q12n4p  . multivitamin with minerals  1 tablet Oral Daily  . senna-docusate  2 tablet Oral BID  . cyanocobalamin  1,000 mcg Oral Daily    Recommendations: Continue IV damptomycin. Will repeat cultures today. Will follow up family decision on hospice. If continuing full scope of care, will need PICC line placed once cultures are negative.   Assessment: Patient with MRSA bacteremia, persistently positive cultures, unclear source. Recent right hip arthroplasty on 12/26 but imaging without evidence on infection and incision appears well healed. Ortho consulted recommended non operative management. Also with multiple decubitus ulcers of his lower extremities that could be a source. On going goals of care discussion per primary, outpatient hospice consulted.   Antibiotics:  1/16 IV vancomycin >> dc'd 1/18 1/16 IV cefepime >> dc'd 1/18 1/18 IV Daptomycin >>   Cultures:  1/16 Blood cultures >> 1/2 with MRSA, 2/2 with staph aureus (sensitivies pending) 1/18 Blood cultures >> 1/2 with MRSA, 2/2 with NG x 2  days 1/20 >> 1/2 with gram positive cocci, 2/2 pending   HPI: Garrett Snow is a 84 y.o. male with past medical history significant for HTN, colon cancer, recent right femoral neck fracture s/p hip arthroplasty 12/26, hx of AAA who presented on 1/16 from SNF with AMS and increased work of breathing. Per family, patient has dementia but is conversational at baseline. Work up at his living facility included a CXR concerning for PNA, and patient was started on Augmentin. He developed worsening AMS and hypotension and was sent to the ED. On arrival, patient was afebrile but hypotensive with BP 69/38. Oxygen saturation was 96% on 2L, HR 71, RR 22. CXR at that time was negative for acute abnormality. 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 Labs were notable for a leukocytosis of 20 and acute on chronic renal dysfunction, creatinine 2.8 from baseline 1.4. BP responded to IVF resuscitation and he was started on broad spectrum antibiotics with IV vancomycin and cefepime. Blood cultures collected in the ED grew MRSA. TTE was negative for evidence of vegetation. Repeat cultures thus far persistently positive. Xrays of left hip on 1/19 were negative.   Interval History: Doing well this morning. Alert and conversational.   Past Medical History:  Diagnosis Date  . AAA (abdominal aortic aneurysm) ([place])   . DJD (degenerative joint disease) 03/07/2012  . Fracture of femoral neck, right ([place]) 08/14/2017  . HTN (  hypertension), benign 03/07/2012    Social History   Tobacco Use  . Smoking status: Former Smoker    Last attempt to quit: 08/19/1974    Years since quitting: 43.0  . Smokeless tobacco: Never Used  Substance Use Topics  . Alcohol use: No    Alcohol/week: 0.0 oz    Frequency: Never  . Drug use: No    No family history on file.  No Known Allergies  Physical Exam: Vitals:   09/09/17 0557 09/09/17 0815  BP: (!) 102/50 (!)  102/54  Pulse: 68   Resp: 14 16  Temp: 98.1 F (36.7 C) 98.3 F (36.8 C)  SpO2:  97%   Constitutional: NAD, appears comfortable HEENT: Atraumatic, normocephalic. PERRL, anicteric sclera.  Neck: Supple, trachea midline.  Cardiovascular: RRR Pulmonary/Chest: CTAB anteriorly  Abdominal: Soft, non tender, non distended. +BS.  Extremities: Warm and well perfused. Distal pulses intact. No edema. LE well wrapped.  Neurological: Alert to person, not place. CN II - XII grossly intact.  Skin: No rashes or erythema  Psychiatric: Normal mood and affect   Lab Results  Component Value Date   WBC 10.4 09/09/2017   HGB 10.2 (L) 09/09/2017   HCT 31.2 (L) 09/09/2017   MCV 93.7 09/09/2017   PLT 259 09/09/2017    Lab Results  Component Value Date   CREATININE 1.45 (H) 09/09/2017   BUN 38 (H) 09/09/2017   NA 146 (H) 09/09/2017   K 4.0 09/09/2017   CL 114 (H) 09/09/2017   CO2 22 09/09/2017    Lab Results  Component Value Date   ALT 62 09/09/2017   AST 76 (H) 09/09/2017   ALKPHOS 189 (H) 09/09/2017     Microbiology: Recent Results (from the past 240 hour(s))  Culture, blood (Routine x 2)     Status: None (Preliminary result)   Collection Time: 09/04/17  1:15 PM  Result Value Ref Range Status   Specimen Description BLOOD LEFT FOREARM  Final   Special Requests   Final    BOTTLES DRAWN AEROBIC AND ANAEROBIC Blood Culture adequate volume   Culture  Setup Time   Final    GRAM POSITIVE COCCI AEROBIC BOTTLE ONLY CRITICAL VALUE NOTED.  VALUE IS CONSISTENT WITH PREVIOUSLY REPORTED AND CALLED VALUE.    Culture GRAM POSITIVE COCCI  Final   Report Status PENDING  Incomplete  Culture, blood (Routine x 2)     Status: Abnormal   Collection Time: 09/04/17  1:23 PM  Result Value Ref Range Status   Specimen Description BLOOD ARM RIGHT  Final   Special Requests IN PEDIATRIC BOTTLE Blood Culture adequate volume  Final   Culture  Setup Time   Final    GRAM POSITIVE COCCI IN PEDIATRIC  BOTTLE CRITICAL RESULT CALLED TO, READ BACK BY AND VERIFIED WITH: G ABBOTT PHARMD [id number] 09/06/17 A BROWNING    Culture METHICILLIN RESISTANT STAPHYLOCOCCUS AUREUS (A)  Final   Report Status 09/08/2017 FINAL  Final   Organism ID, Bacteria METHICILLIN RESISTANT STAPHYLOCOCCUS AUREUS  Final      Susceptibility   Methicillin resistant staphylococcus aureus - MIC*    CIPROFLOXACIN >=8 RESISTANT Resistant     ERYTHROMYCIN <=0.25 SENSITIVE Sensitive     GENTAMICIN <=0.5 SENSITIVE Sensitive     OXACILLIN RESISTANT Resistant     TETRACYCLINE <=1 SENSITIVE Sensitive     VANCOMYCIN 1 SENSITIVE Sensitive     TRIMETH/SULFA <=10 SENSITIVE Sensitive     CLINDAMYCIN <=0.25 SENSITIVE Sensitive     RIFAMPIN <=0.5  SENSITIVE Sensitive     Inducible Clindamycin NEGATIVE Sensitive     * METHICILLIN RESISTANT STAPHYLOCOCCUS AUREUS  Blood Culture ID Panel (Reflexed)     Status: Abnormal   Collection Time: 09/04/17  1:23 PM  Result Value Ref Range Status   Enterococcus species NOT DETECTED NOT DETECTED Final   Listeria monocytogenes NOT DETECTED NOT DETECTED Final   Staphylococcus species DETECTED (A) NOT DETECTED Final    Comment: CRITICAL RESULT CALLED TO, READ BACK BY AND VERIFIED WITH: G ABBOTT PHARMD 0149 09/06/17 A BROWNING    Staphylococcus aureus DETECTED (A) NOT DETECTED Final    Comment: Methicillin (oxacillin)-resistant Staphylococcus aureus (MRSA). MRSA is predictably resistant to beta-lactam antibiotics (except ceftaroline). Preferred therapy is vancomycin unless clinically contraindicated. Patient requires contact precautions if  hospitalized. CRITICAL RESULT CALLED TO, READ BACK BY AND VERIFIED WITH: G ABBOTT PHARMD 0149 09/06/17 A BROWNING    Methicillin resistance DETECTED (A) NOT DETECTED Final    Comment: CRITICAL RESULT CALLED TO, READ BACK BY AND VERIFIED WITH: G ABBOTT PHARMD 0149 09/06/17 A BROWNING    Streptococcus species NOT DETECTED NOT DETECTED Final   Streptococcus agalactiae  NOT DETECTED NOT DETECTED Final   Streptococcus pneumoniae NOT DETECTED NOT DETECTED Final   Streptococcus pyogenes NOT DETECTED NOT DETECTED Final   Acinetobacter baumannii NOT DETECTED NOT DETECTED Final   Enterobacteriaceae species NOT DETECTED NOT DETECTED Final   Enterobacter cloacae complex NOT DETECTED NOT DETECTED Final   Escherichia coli NOT DETECTED NOT DETECTED Final   Klebsiella oxytoca NOT DETECTED NOT DETECTED Final   Klebsiella pneumoniae NOT DETECTED NOT DETECTED Final   Proteus species NOT DETECTED NOT DETECTED Final   Serratia marcescens NOT DETECTED NOT DETECTED Final   Haemophilus influenzae NOT DETECTED NOT DETECTED Final   Neisseria meningitidis NOT DETECTED NOT DETECTED Final   Pseudomonas aeruginosa NOT DETECTED NOT DETECTED Final   Candida albicans NOT DETECTED NOT DETECTED Final   Candida glabrata NOT DETECTED NOT DETECTED Final   Candida krusei NOT DETECTED NOT DETECTED Final   Candida parapsilosis NOT DETECTED NOT DETECTED Final   Candida tropicalis NOT DETECTED NOT DETECTED Final  Urine culture     Status: None   Collection Time: 09/04/17  5:42 PM  Result Value Ref Range Status   Specimen Description URINE, RANDOM  Final   Special Requests NONE  Final   Culture NO GROWTH  Final   Report Status 09/05/2017 FINAL  Final  MRSA PCR Screening     Status: None   Collection Time: 09/04/17 10:11 PM  Result Value Ref Range Status   MRSA by PCR NEGATIVE NEGATIVE Final    Comment:        The GeneXpert MRSA Assay (FDA approved for NASAL specimens only), is one component of a comprehensive MRSA colonization surveillance program. It is not intended to diagnose MRSA infection nor to guide or monitor treatment for MRSA infections.   Culture, blood (routine x 2)     Status: None (Preliminary result)   Collection Time: 09/06/17  8:12 AM  Result Value Ref Range Status   Specimen Description BLOOD RIGHT FOREARM  Final   Special Requests   Final    BOTTLES DRAWN  AEROBIC ONLY Blood Culture adequate volume   Culture NO GROWTH 2 DAYS  Final   Report Status PENDING  Incomplete  Culture, blood (routine x 2)     Status: Abnormal   Collection Time: 09/06/17  8:16 AM  Result Value Ref Range Status   Specimen  Description BLOOD RIGHT HAND  Final   Special Requests IN PEDIATRIC BOTTLE Blood Culture adequate volume  Final   Culture  Setup Time   Final    GRAM POSITIVE COCCI IN PEDIATRIC BOTTLE CRITICAL RESULT CALLED TO, READ BACK BY AND VERIFIED WITH: G ABBOTT PHARMD 0023 09/07/17 A BROWNING    Culture (A)  Final    STAPHYLOCOCCUS AUREUS SUSCEPTIBILITIES PERFORMED ON PREVIOUS CULTURE WITHIN THE LAST 5 DAYS.    Report Status 09/08/2017 FINAL  Final  Blood Culture ID Panel (Reflexed)     Status: Abnormal   Collection Time: 09/06/17  8:16 AM  Result Value Ref Range Status   Enterococcus species NOT DETECTED NOT DETECTED Final   Listeria monocytogenes NOT DETECTED NOT DETECTED Final   Staphylococcus species DETECTED (A) NOT DETECTED Final    Comment: CRITICAL RESULT CALLED TO, READ BACK BY AND VERIFIED WITH: G ABBOTT PHARMD 0023 09/07/17 A BROWNING    Staphylococcus aureus DETECTED (A) NOT DETECTED Final    Comment: Methicillin (oxacillin)-resistant Staphylococcus aureus (MRSA). MRSA is predictably resistant to beta-lactam antibiotics (except ceftaroline). Preferred therapy is vancomycin unless clinically contraindicated. Patient requires contact precautions if  hospitalized. CRITICAL RESULT CALLED TO, READ BACK BY AND VERIFIED WITH: G ABBOTT PHARMD 0023 09/07/17 A BROWNING    Methicillin resistance DETECTED (A) NOT DETECTED Final    Comment: CRITICAL RESULT CALLED TO, READ BACK BY AND VERIFIED WITH: G ABBOTT PHARMD 0023 09/07/17 A BROWNING    Streptococcus species NOT DETECTED NOT DETECTED Final   Streptococcus agalactiae NOT DETECTED NOT DETECTED Final   Streptococcus pneumoniae NOT DETECTED NOT DETECTED Final   Streptococcus pyogenes NOT DETECTED NOT  DETECTED Final   Acinetobacter baumannii NOT DETECTED NOT DETECTED Final   Enterobacteriaceae species NOT DETECTED NOT DETECTED Final   Enterobacter cloacae complex NOT DETECTED NOT DETECTED Final   Escherichia coli NOT DETECTED NOT DETECTED Final   Klebsiella oxytoca NOT DETECTED NOT DETECTED Final   Klebsiella pneumoniae NOT DETECTED NOT DETECTED Final   Proteus species NOT DETECTED NOT DETECTED Final   Serratia marcescens NOT DETECTED NOT DETECTED Final   Haemophilus influenzae NOT DETECTED NOT DETECTED Final   Neisseria meningitidis NOT DETECTED NOT DETECTED Final   Pseudomonas aeruginosa NOT DETECTED NOT DETECTED Final   Candida albicans NOT DETECTED NOT DETECTED Final   Candida glabrata NOT DETECTED NOT DETECTED Final   Candida krusei NOT DETECTED NOT DETECTED Final   Candida parapsilosis NOT DETECTED NOT DETECTED Final   Candida tropicalis NOT DETECTED NOT DETECTED Final  Culture, blood (routine x 2)     Status: None (Preliminary result)   Collection Time: 09/08/17  9:20 AM  Result Value Ref Range Status   Specimen Description BLOOD RIGHT HAND  Final   Special Requests IN PEDIATRIC BOTTLE Blood Culture adequate volume  Final   Culture  Setup Time   Final    GRAM POSITIVE COCCI IN CLUSTERS IN PEDIATRIC BOTTLE CRITICAL RESULT CALLED TO, READ BACK BY AND VERIFIED WITH: Sandria Bales [phone] MLM    Culture GRAM POSITIVE COCCI  Final   Report Status PENDING  Incomplete    [name], MD - [place] for Infectious Disease [facility] [email or web address] [phone] pager  [phone] cell 09/09/2017, 10:47 AM

## 2017-09-10 DIAGNOSIS — I10 Essential (primary) hypertension: Secondary | ICD-10-CM

## 2017-09-10 LAB — CBC WITH DIFFERENTIAL/PLATELET
BAND NEUTROPHILS: 4 %
BASOS ABS: 0 10*3/uL (ref 0.0–0.1)
BASOS PCT: 0 %
Blasts: 0 %
EOS ABS: 0.2 10*3/uL (ref 0.0–0.7)
EOS PCT: 2 %
HCT: 29.4 % — ABNORMAL LOW (ref 39.0–52.0)
Hemoglobin: 9.1 g/dL — ABNORMAL LOW (ref 13.0–17.0)
LYMPHS ABS: 1.5 10*3/uL (ref 0.7–4.0)
Lymphocytes Relative: 20 %
MCH: 29.3 pg (ref 26.0–34.0)
MCHC: 31 g/dL (ref 30.0–36.0)
MCV: 94.5 fL (ref 78.0–100.0)
METAMYELOCYTES PCT: 3 %
MONO ABS: 0.5 10*3/uL (ref 0.1–1.0)
MYELOCYTES: 1 %
Monocytes Relative: 6 %
Neutro Abs: 5.3 10*3/uL (ref 1.7–7.7)
Neutrophils Relative %: 64 %
Other: 0 %
PLATELETS: 286 10*3/uL (ref 150–400)
Promyelocytes Absolute: 0 %
RBC: 3.11 MIL/uL — ABNORMAL LOW (ref 4.22–5.81)
RDW: 15.8 % — AB (ref 11.5–15.5)
WBC: 7.5 10*3/uL (ref 4.0–10.5)
nRBC: 1 /100 WBC — ABNORMAL HIGH

## 2017-09-10 LAB — COMPREHENSIVE METABOLIC PANEL
ALT: 41 U/L (ref 17–63)
AST: 32 U/L (ref 15–41)
Albumin: 1.6 g/dL — ABNORMAL LOW (ref 3.5–5.0)
Alkaline Phosphatase: 145 U/L — ABNORMAL HIGH (ref 38–126)
Anion gap: 8 (ref 5–15)
BUN: 39 mg/dL — AB (ref 6–20)
CHLORIDE: 111 mmol/L (ref 101–111)
CO2: 24 mmol/L (ref 22–32)
Calcium: 9 mg/dL (ref 8.9–10.3)
Creatinine, Ser: 1.48 mg/dL — ABNORMAL HIGH (ref 0.61–1.24)
GFR calc Af Amer: 44 mL/min — ABNORMAL LOW (ref >60)
GFR, EST NON AFRICAN AMERICAN: 38 mL/min — AB (ref >60)
Glucose, Bld: 112 mg/dL — ABNORMAL HIGH (ref 65–99)
POTASSIUM: 4 mmol/L (ref 3.5–5.1)
SODIUM: 143 mmol/L (ref 135–145)
Total Bilirubin: 0.5 mg/dL (ref 0.3–1.2)
Total Protein: 4.6 g/dL — ABNORMAL LOW (ref 6.5–8.1)

## 2017-09-10 LAB — CULTURE, BLOOD (ROUTINE X 2): SPECIAL REQUESTS: ADEQUATE

## 2017-09-10 LAB — MAGNESIUM: MAGNESIUM: 2.2 mg/dL (ref 1.7–2.4)

## 2017-09-10 LAB — PHOSPHORUS: PHOSPHORUS: 3.2 mg/dL (ref 2.5–4.6)

## 2017-09-10 NOTE — Progress Notes (Signed)
PROGRESS NOTE    Brantly Kalman Page  HCW:[id number] DOB: 01-16-1919 DOA: 09/04/2017 PCP: [name], MD   Brief Narrative:  Garrett Snow is a 84 y.o. male with medical history significant of HTN, DJD, Recent Right Femoral Neck Fx s/p Hip Arthroplasty 12/26, Hx of AAA, and other comorbids who presented to [name] from SNF with AMS and increased work of breathing. Per family patient at baseline has some dementia but is able to carry on a conversation to an extent. Patient's family noticed a decline yesterday morning and noticed that he was more confused than baseline. Work up performed at [facility] included a CXR which showed PNA and patient was started on Augmentin. Because of worsening confusion and Hypotension, he was sent to the ED for evaluation. Upon my evaluation patient is confused but is able to answer some questions. Denies any Pain. TRH was asked to admit for Sepsis and AMS. Patient's Blood Cx grew 1/2 MRSA so will repeat Blood Cx and likely narrow just to Vancomycin and Infectious Diseases was automatically consulted because of positive BCID. ID Changed Abx to Vancomycin. Blood Cultures remained persistently positive but finally showed NGTD <24 hours from Cx obtained 09/09/17; If remains Negative at 3 days will place PICC Line and D/C Back to SNF as patient is other wise medically stable.   Assessment & Plan:   Active Problems:   AAA (abdominal aortic aneurysm) (HCC)   Fracture of femoral neck, right (HCC)   Hip fracture (HCC)   Sepsis (HCC)   Acute kidney injury superimposed on chronic kidney disease ([place])   Acute urinary retention   Localized swelling of both lower legs   Normocytic anemia   Leukocytosis   Pressure injury of skin   Protein-calorie malnutrition, severe   Hypokalemia   Hypophosphatemia   Elevated troponin   Elevated AST (SGOT)  Sepsis from MRSA Bacteremia, and less likely HCAP, improving -Sepsis Protocol Initiated in ED -Admitted to SDU but hemodynamically stable  to transfer to Telemetry  -Outside Facility felt as if he had a LLL Opacity  -Patient was Hypotensive, has a Leukocytosis, Tachypenic and was diagnosed with a PNA at [facility] in [place] -Received 30 mL/kg Sepsis Protocol; Maintenance IVF with NS at 75 mL/hr; Changed to D5W given Hypernatremia and Hypercholremia; Now decreased rate to 50 mL/hr and will continue today as patient appears more fatigued  -Sepsis Physiology is improving  -Checked Procalcitonin and was 55.53 and Trend Lactic Acid Levels and last was 2.0 -Broad Spectrum Abx with IV Vancomycin change to IV Daptomycin by Infectious Diseases; IV Cefepime discontinued given +MRSA in blood  -Checked Urinalysis and was Negative and Urine Cx showed No Growth (but was on Abx); Obtain Sputum Cx -Blood Cx x2 obtained in ED showed MRSA; Repeat Blood Cx 09/06/17 and 09/08/17 still persistently positive  -Repeated Blood Cx 09/09/17 AM per ID showed NGTD <24 hours -WBC now improved and is normal at 7.5 -Infectious Diseases automatically consulted because of Positive BCID -Unclear source of Bacteremia; ECHO just done did not mention any vegetations but did show mildly thickened leaflets; -Will not pursue TEE given Age and risk of Decompensation  -Obtained Right Hip X-Ray to evaluate and showed Right hip replacement is in good position. No acute fractures or dislocations identified. -? 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 -I called [name] in Neurosurgery who  states he will review the scan and make recommendations; He recommended no intervention at this time and does not feel as if Patient's Subdural Hygroma is the cause of patient's encephalopathy  -SLP initially recommended Dysphagia 1 Puree Thin Liquids but after re-evaluation are Recommending Dysphagia 2  -Treat Suspected Infectious Etiology with Broad Spectrum Abx and changed to Daptomycin  -Seizure Precautions; Aspiration Precautions -Follow up on Cx's and they are Positive for MRSA; Repeat Blood Cx this AM  -Patient's Hypernatremia improved  C/w D5W at lower rate today and repeat CMP in in AM   Hypotension but Hx of Hypertension -Hold All Antihypertensives including Amlodipine and Proscar -C/w IVF Resuscitation as above -BP remaining on softer side and is 103/47  AKI on CKD Stage 3 -Baseline Cr was 1.4-1.5 -BUN/Cr on Admission showed patient to be 53/2.80; Now improved to 39/1.48 and now back to baseline -? Obstruction from Acute Urinary Retention;  -Obtained Renal U/S and showed No hydronephrosis. Echogenic mildly atrophic kidneys, compatible with acute on chronic nonspecific renal parenchymal disease. Simple bilateral renal cysts. Foley catheter in place within the minimally distended urinary bladder. Suggestion of mild diffuse bladder wall thickening and trabeculation, probably due to chronic bladder outlet obstruction -Bladder Scan showed 646 -Place Foley Catheter to Gravity; Will try Voiding Trial and patient able to void; Condom Catheter now in place; Asked nurse to do PVR Bladder Scan  -Avoid Nephrotoxic Medications -C/w Maintenance IVF Rehydration as above -Repeat CMP in AM  Acute Urinary Retention, improved -Bladder Scan showed 646 mL on Admission  -Checked Renal U/S and showed mild diffuse bladder wall thickening and trabeculation, probably due to chronic bladder outlet obstruction -Placed Foley Cathter and will tried Voiding Trial 1/19; Wearing Condom Cath now and able to  void  -Finasteride held because of Hypotension   Non-Occlusive Thrombus of the Inferior Vena Cava -Noted on Renal U/S and was at the level of the Right Renal Vein -Difficult situation and don't know if it is accute; Hesitant to Anticoagulate because of Falls and with Hx of Subdural hematoma -Discussed with [name] in Vascular who recommended to discuss with IR about appropriate Imaging Modalities and ? Of IVC Filter -Discussed Case with Dr. Wallene Dales who recommended no IVC filter because above the Renal Vein; Per [name] no appropriate imaging can be done as cannot use Contrast due to patient's Kidney Function. After discussion with [name] and review of the films, he cannot exclude tumor -For now we will just monitor the patient and discuss with his family about findings   Recent Right Hip Femoral Neck Fx -S/p Right Hemiarthroplasty by [name] 12/26 -PT/OT to Evaluate and treat -Obtained Right Hip X-Ray to evaluate and showed Right hip replacement is in good position. No acute fractures or dislocations identified. -C/w VTE Prophylaxis with Lovenox 30 mg sq daily  Bilateral Lower Extremity Swelling, improving  -Possibly due to poor Nutritional Status with low Albumin of 1.6 -Nutritionist Consulted and awaiting Recc's; Nutritionist recommending Ensure Enlive po BID, Prostat 30 mL BID and MVI -Checked ECHOCardiogram and EF was 60-65% and had Grade 1 DD -LE Duplex Negative for DVT -Checked Urinalysis to r/o Nephrotic Syndrome and had no  Protein   Bilateral LE Leg Wounds/Ulcers -WOC Nurse Consultation appreciated -Recommendations per [place] Nurse note  Normocytic Anemia -Patient's Hb/Hct was 9.6/30.2 on admission and stable at 9.1/29.4 -Continue to Monitor for S/Sx of Bleeding and expect Dilutional Drop -Repeat CBC in AM  Hx of AAA -Last CT Abd/Pelvis 08/17/17 showed Ectatic Abdominal Aorta at risk for Aneurysm Development -Follow up as an Outpatient    Leukocytosis, improved  -Patient's WBC was 20.4 on admission and improved to 7.5 -Likely infectious in origin; C/w IV Abx as designated as above -Repeat CBC in AM   Hypernatremia/Hypercholremia, improved  -Na+ 143 today and Chloride 111Today  -C/w D5W at reduced rate to 50 mL/hr and Consider D/C IVF in AM  -Repeat CMP in AM  Hypokalemia -Patient's K+ was 4.0 this AM -Patient also receiving IVF Resuscitation with D5W + 20 mEQ KCl -Continue to Monitor and Replete as Necessary -Repeat K+ in AM   Hypophosphatemia -Patient's Phos Level was 2.3 and improved to 3.2 -Continue to Monitor and Replete as Necessary -Repeat Phos Level in AM   Elevated Troponin, trended down -Patient's Troponin was elevated at 0.23 and trended down to 0.15 -Likely 2/2 to Sepsis and not indicative of ACS -Patient denies any CP  Elevated AST, improved  -Patient's AST went from 26 -> 64 -> 54 -> 76 -> 32 -Likely in the setting of IV Abx with Daptomycin -Continue to Monitor Closely on Abx -Repeat CMP in AM   Severe Malnutrition in the Context of Chronic Illness -Nutritionist Consulted and appreciate further recc's -C/w Ensure Enlive po BID, Prostat 30 mL BID and MVI po Daily   DVT prophylaxis: Enoxaparin 30 mg sq q24h Code Status: DO NOT RESUSCITATE Family Communication: Discussed with Son at bedside Disposition Plan: Remain Inpatient for further evaluation and workup  Consultants:   Discussed Case with Vascular Surgery [name]  Discussed Case with IR [name]  Infectious Diseases   Orthopedic [name]   Procedures:  ECHOCARDIOGRAM ------------------------------------------------------------------- Study Conclusions  - Left ventricle: The cavity size was normal. There was moderate   concentric hypertrophy. Systolic function was normal. The   estimated ejection fraction was in the range of 60% to 65%. Wall   motion was normal; there were no regional wall motion   abnormalities.  Doppler parameters are consistent with abnormal   left ventricular relaxation (grade 1 diastolic dysfunction). - Aortic valve: There was no regurgitation. - Mitral valve: Calcified annulus. Mildly thickened leaflets .   There was moderate regurgitation. - Left atrium: The atrium was moderately dilated. - Right ventricle: The cavity size was normal. Wall thickness was   normal. Systolic function was normal. - Right atrium: The atrium was normal in size. - Tricuspid valve: There was mild regurgitation. - Pulmonary arteries: Systolic pressure was moderately increased.   PA peak pressure: 46 mm Hg (S). - Inferior vena cava: The vessel was normal in size.  LOWER EXTREMITY DUPLEX Negative for DVT.  Antimicrobials:  Anti-infectives (From admission, onward)   Start     Dose/Rate Route Frequency Ordered Stop   09/10/17 1400  DAPTOmycin (CUBICIN) 764 mg in sodium chloride 0.9 % IVPB  Status:  Discontinued     10 mg/kg  76.4 kg 224.4 mL/hr over 30 Minutes Intravenous Every 48 hours 09/09/17 1129 09/09/17 1406   09/10/17 1400  DAPTOmycin (CUBICIN) 750 mg in sodium chloride 0.9 % IVPB     750 mg 224.4 mL/hr over 30 Minutes Intravenous Every 48 hours 09/09/17 1406  09/06/17 1600  vancomycin (VANCOCIN) IVPB 1000 mg/200 mL premix  Status:  Discontinued     1,000 mg 200 mL/hr over 60 Minutes Intravenous Every 48 hours 09/04/17 1528 09/06/17 1101   09/06/17 1400  vancomycin (VANCOCIN) IVPB 750 mg/150 ml premix  Status:  Discontinued     750 mg 150 mL/hr over 60 Minutes Intravenous Every 24 hours 09/06/17 1101 09/06/17 1149   09/06/17 1400  DAPTOmycin (CUBICIN) 611 mg in sodium chloride 0.9 % IVPB  Status:  Discontinued     8 mg/kg  76.4 kg 224.4 mL/hr over 30 Minutes Intravenous Every 48 hours 09/06/17 1149 09/09/17 1129   09/05/17 1600  ceFEPIme (MAXIPIME) 1 g in dextrose 5 % 50 mL IVPB  Status:  Discontinued     1 g 100 mL/hr over 30 Minutes Intravenous Every 24 hours 09/04/17 1528  09/06/17 1003   09/04/17 1530  vancomycin (VANCOCIN) 1,500 mg in sodium chloride 0.9 % 500 mL IVPB     1,500 mg 250 mL/hr over 120 Minutes Intravenous STAT 09/04/17 1528 09/04/17 1911   09/04/17 1530  ceFEPIme (MAXIPIME) 2 g in dextrose 5 % 50 mL IVPB     2 g 100 mL/hr over 30 Minutes Intravenous STAT 09/04/17 1528 09/04/17 1713     Subjective: Seen and examined at bedside and was more interactive and alert. Was joking around today. Denied any pain and stated he was not hungry. No complaints.  Objective: Vitals:   09/09/17 0815 09/09/17 1429 09/09/17 2151 09/10/17 0513  BP: (!) 102/54 (!) 109/50 (!) 109/49 (!) 103/47  Pulse:  74 66 (!) 56  Resp: 16 16 17 17   Temp: 98.3 F (36.8 C) 98.4 F (36.9 C) 98 F (36.7 C) 98.2 F (36.8 C)  TempSrc: Axillary Oral Oral Oral  SpO2: 97% 97% 93% 98%  Weight:    75 kg (165 lb 5.5 oz)  Height:        Intake/Output Summary (Last 24 hours) at 09/10/2017 1245 Last data filed at 09/10/2017 0900 Gross per 24 hour  Intake 1030 ml  Output 900 ml  Net 130 ml   Filed Weights   09/08/17 0500 09/09/17 0140 09/10/17 0513  Weight: 75.6 kg (166 lb 10.7 oz) 77.9 kg (171 lb 11.8 oz) 75 kg (165 lb 5.5 oz)   Examination: Physical Exam:  Constitutional: Pleasant elderly Caucasian male who is awake and in NAD; Much more alert and interactive and conversive today. Eyes: Sclerae anicteric. Has some arcus senilis  ENMT: External Ears and nose appear normal. MMM Neck: Supple with no JVD Respiratory: Slight diminished at the bases but unlabored breathing. No appreciable wheezing/rales/rhonchi. Not wearing any supplemental O2 via [place] Cardiovascular: RRR; very mild LE edema Abdomen: Soft, NT, ND. Bowel sounds present GU: Deferred; Wearing condom catheter Musculoskeletal: No contractures; No cyanosis Skin: Warm and dry. Has diffuse seborrheic keratosis scattered through out the body. LE pressure ulcers noted Neurologic: CN 2-12 grossly intact. No appreciable  focal deficits Psychiatric: Awake and alert. Normal and affect.   Data Reviewed: I have personally reviewed following labs and imaging studies  CBC: Recent Labs  Lab 09/06/17 0357 09/07/17 0947 09/08/17 0355 09/09/17 0340 09/10/17 0456  WBC 11.8* 9.7 8.4 10.4 7.5  NEUTROABS 9.9* 7.0 6.2 7.2 5.3  HGB 9.0* 9.6* 9.5* 10.2* 9.1*  HCT 28.1* 30.6* 30.2* 31.2* 29.4*  MCV 90.9 93.0 94.1 93.7 94.5  PLT 189 187 223 259 970   Basic Metabolic Panel: Recent Labs  Lab 09/06/17 0357 09/07/17  4944 09/08/17 0355 09/09/17 0340 09/10/17 0456  NA 148* 145 146* 146* 143  K 3.4* 4.0 3.9 4.0 4.0  CL 117* 115* 114* 114* 111  CO2 21* 20* 23 22 24   GLUCOSE 132* 118* 113* 114* 112*  BUN 44* 34* 34* 38* 39*  CREATININE 1.77* 1.40* 1.37* 1.45* 1.48*  CALCIUM 8.9 8.7* 8.9 8.9 9.0  MG 1.9 2.1 2.0 2.1 2.2  PHOS 2.4* 2.3* 2.6 2.6 3.2   GFR: Estimated Creatinine Clearance: 31 mL/min (A) (by C-G formula based on SCr of 1.48 mg/dL (H)). Liver Function Tests: Recent Labs  Lab 09/06/17 0357 09/07/17 0947 09/08/17 0355 09/09/17 0340 09/10/17 0456  AST 26 64* 54* 76* 32  ALT 18 43 46 62 41  ALKPHOS 105 156* 178* 189* 145*  BILITOT 0.7 0.9 0.8 0.7 0.5  PROT 4.5* 4.7* 4.6* 5.0* 4.6*  ALBUMIN 1.7* 1.8* 1.7* 1.8* 1.6*   No results for input(s): LIPASE, AMYLASE in the last 168 hours. No results for input(s): AMMONIA in the last 168 hours. Coagulation Profile: Recent Labs  Lab 09/04/17 1315  INR 1.65   Cardiac Enzymes: Recent Labs  Lab 09/04/17 2137 09/05/17 0115 09/05/17 0741 09/06/17 0357  CKTOTAL  --   --   --  34*  TROPONINI 0.23* 0.20* 0.15*  --    BNP (last 3 results) No results for input(s): PROBNP in the last 8760 hours. HbA1C: No results for input(s): HGBA1C in the last 72 hours. CBG: Recent Labs  Lab 09/04/17 1345 09/04/17 2103  GLUCAP 120* 114*   Lipid Profile: No results for input(s): CHOL, HDL, LDLCALC, TRIG, CHOLHDL, LDLDIRECT in the last 72 hours. Thyroid  Function Tests: No results for input(s): TSH, T4TOTAL, FREET4, T3FREE, THYROIDAB in the last 72 hours. Anemia Panel: No results for input(s): VITAMINB12, FOLATE, FERRITIN, TIBC, IRON, RETICCTPCT in the last 72 hours. Sepsis Labs: Recent Labs  Lab 09/04/17 1325 09/04/17 1749 09/04/17 2137  PROCALCITON  --   --  55.53  LATICACIDVEN 1.62 1.74 2.0*    Recent Results (from the past 240 hour(s))  Culture, blood (Routine x 2)     Status: Abnormal   Collection Time: 09/04/17  1:15 PM  Result Value Ref Range Status   Specimen Description BLOOD LEFT FOREARM  Final   Special Requests   Final    BOTTLES DRAWN AEROBIC AND ANAEROBIC Blood Culture adequate volume   Culture  Setup Time   Final    GRAM POSITIVE COCCI AEROBIC BOTTLE ONLY CRITICAL VALUE NOTED.  VALUE IS CONSISTENT WITH PREVIOUSLY REPORTED AND CALLED VALUE.    Culture (A)  Final    STAPHYLOCOCCUS AUREUS SUSCEPTIBILITIES PERFORMED ON PREVIOUS CULTURE WITHIN THE LAST 5 DAYS.    Report Status 09/10/2017 FINAL  Final  Culture, blood (Routine x 2)     Status: Abnormal   Collection Time: 09/04/17  1:23 PM  Result Value Ref Range Status   Specimen Description BLOOD ARM RIGHT  Final   Special Requests IN PEDIATRIC BOTTLE Blood Culture adequate volume  Final   Culture  Setup Time   Final    GRAM POSITIVE COCCI IN PEDIATRIC BOTTLE CRITICAL RESULT CALLED TO, READ BACK BY AND VERIFIED WITH: G ABBOTT PHARMD 0149 09/06/17 A BROWNING    Culture METHICILLIN RESISTANT STAPHYLOCOCCUS AUREUS (A)  Final   Report Status 09/08/2017 FINAL  Final   Organism ID, Bacteria METHICILLIN RESISTANT STAPHYLOCOCCUS AUREUS  Final      Susceptibility   Methicillin resistant staphylococcus aureus - MIC*  CIPROFLOXACIN >=8 RESISTANT Resistant     ERYTHROMYCIN <=0.25 SENSITIVE Sensitive     GENTAMICIN <=0.5 SENSITIVE Sensitive     OXACILLIN RESISTANT Resistant     TETRACYCLINE <=1 SENSITIVE Sensitive     VANCOMYCIN 1 SENSITIVE Sensitive     TRIMETH/SULFA  <=10 SENSITIVE Sensitive     CLINDAMYCIN <=0.25 SENSITIVE Sensitive     RIFAMPIN <=0.5 SENSITIVE Sensitive     Inducible Clindamycin NEGATIVE Sensitive     * METHICILLIN RESISTANT STAPHYLOCOCCUS AUREUS  Blood Culture ID Panel (Reflexed)     Status: Abnormal   Collection Time: 09/04/17  1:23 PM  Result Value Ref Range Status   Enterococcus species NOT DETECTED NOT DETECTED Final   Listeria monocytogenes NOT DETECTED NOT DETECTED Final   Staphylococcus species DETECTED (A) NOT DETECTED Final    Comment: CRITICAL RESULT CALLED TO, READ BACK BY AND VERIFIED WITH: G ABBOTT PHARMD 0149 09/06/17 A BROWNING    Staphylococcus aureus DETECTED (A) NOT DETECTED Final    Comment: Methicillin (oxacillin)-resistant Staphylococcus aureus (MRSA). MRSA is predictably resistant to beta-lactam antibiotics (except ceftaroline). Preferred therapy is vancomycin unless clinically contraindicated. Patient requires contact precautions if  hospitalized. CRITICAL RESULT CALLED TO, READ BACK BY AND VERIFIED WITH: G ABBOTT PHARMD 0149 09/06/17 A BROWNING    Methicillin resistance DETECTED (A) NOT DETECTED Final    Comment: CRITICAL RESULT CALLED TO, READ BACK BY AND VERIFIED WITH: G ABBOTT PHARMD 0149 09/06/17 A BROWNING    Streptococcus species NOT DETECTED NOT DETECTED Final   Streptococcus agalactiae NOT DETECTED NOT DETECTED Final   Streptococcus pneumoniae NOT DETECTED NOT DETECTED Final   Streptococcus pyogenes NOT DETECTED NOT DETECTED Final   Acinetobacter baumannii NOT DETECTED NOT DETECTED Final   Enterobacteriaceae species NOT DETECTED NOT DETECTED Final   Enterobacter cloacae complex NOT DETECTED NOT DETECTED Final   Escherichia coli NOT DETECTED NOT DETECTED Final   Klebsiella oxytoca NOT DETECTED NOT DETECTED Final   Klebsiella pneumoniae NOT DETECTED NOT DETECTED Final   Proteus species NOT DETECTED NOT DETECTED Final   Serratia marcescens NOT DETECTED NOT DETECTED Final   Haemophilus influenzae  NOT DETECTED NOT DETECTED Final   Neisseria meningitidis NOT DETECTED NOT DETECTED Final   Pseudomonas aeruginosa NOT DETECTED NOT DETECTED Final   Candida albicans NOT DETECTED NOT DETECTED Final   Candida glabrata NOT DETECTED NOT DETECTED Final   Candida krusei NOT DETECTED NOT DETECTED Final   Candida parapsilosis NOT DETECTED NOT DETECTED Final   Candida tropicalis NOT DETECTED NOT DETECTED Final  Urine culture     Status: None   Collection Time: 09/04/17  5:42 PM  Result Value Ref Range Status   Specimen Description URINE, RANDOM  Final   Special Requests NONE  Final   Culture NO GROWTH  Final   Report Status 09/05/2017 FINAL  Final  MRSA PCR Screening     Status: None   Collection Time: 09/04/17 10:11 PM  Result Value Ref Range Status   MRSA by PCR NEGATIVE NEGATIVE Final    Comment:        The GeneXpert MRSA Assay (FDA approved for NASAL specimens only), is one component of a comprehensive MRSA colonization surveillance program. It is not intended to diagnose MRSA infection nor to guide or monitor treatment for MRSA infections.   Culture, blood (routine x 2)     Status: None (Preliminary result)   Collection Time: 09/06/17  8:12 AM  Result Value Ref Range Status   Specimen Description BLOOD RIGHT FOREARM  Final   Special Requests   Final    BOTTLES DRAWN AEROBIC ONLY Blood Culture adequate volume   Culture NO GROWTH 4 DAYS  Final   Report Status PENDING  Incomplete  Culture, blood (routine x 2)     Status: Abnormal   Collection Time: 09/06/17  8:16 AM  Result Value Ref Range Status   Specimen Description BLOOD RIGHT HAND  Final   Special Requests IN PEDIATRIC BOTTLE Blood Culture adequate volume  Final   Culture  Setup Time   Final    GRAM POSITIVE COCCI IN PEDIATRIC BOTTLE CRITICAL RESULT CALLED TO, READ BACK BY AND VERIFIED WITH: G ABBOTT PHARMD 0023 09/07/17 A BROWNING    Culture (A)  Final    STAPHYLOCOCCUS AUREUS SUSCEPTIBILITIES PERFORMED ON PREVIOUS  CULTURE WITHIN THE LAST 5 DAYS.    Report Status 09/08/2017 FINAL  Final  Blood Culture ID Panel (Reflexed)     Status: Abnormal   Collection Time: 09/06/17  8:16 AM  Result Value Ref Range Status   Enterococcus species NOT DETECTED NOT DETECTED Final   Listeria monocytogenes NOT DETECTED NOT DETECTED Final   Staphylococcus species DETECTED (A) NOT DETECTED Final    Comment: CRITICAL RESULT CALLED TO, READ BACK BY AND VERIFIED WITH: G ABBOTT PHARMD 0023 09/07/17 A BROWNING    Staphylococcus aureus DETECTED (A) NOT DETECTED Final    Comment: Methicillin (oxacillin)-resistant Staphylococcus aureus (MRSA). MRSA is predictably resistant to beta-lactam antibiotics (except ceftaroline). Preferred therapy is vancomycin unless clinically contraindicated. Patient requires contact precautions if  hospitalized. CRITICAL RESULT CALLED TO, READ BACK BY AND VERIFIED WITH: G ABBOTT PHARMD 0023 09/07/17 A BROWNING    Methicillin resistance DETECTED (A) NOT DETECTED Final    Comment: CRITICAL RESULT CALLED TO, READ BACK BY AND VERIFIED WITH: G ABBOTT PHARMD 0023 09/07/17 A BROWNING    Streptococcus species NOT DETECTED NOT DETECTED Final   Streptococcus agalactiae NOT DETECTED NOT DETECTED Final   Streptococcus pneumoniae NOT DETECTED NOT DETECTED Final   Streptococcus pyogenes NOT DETECTED NOT DETECTED Final   Acinetobacter baumannii NOT DETECTED NOT DETECTED Final   Enterobacteriaceae species NOT DETECTED NOT DETECTED Final   Enterobacter cloacae complex NOT DETECTED NOT DETECTED Final   Escherichia coli NOT DETECTED NOT DETECTED Final   Klebsiella oxytoca NOT DETECTED NOT DETECTED Final   Klebsiella pneumoniae NOT DETECTED NOT DETECTED Final   Proteus species NOT DETECTED NOT DETECTED Final   Serratia marcescens NOT DETECTED NOT DETECTED Final   Haemophilus influenzae NOT DETECTED NOT DETECTED Final   Neisseria meningitidis NOT DETECTED NOT DETECTED Final   Pseudomonas aeruginosa NOT DETECTED NOT  DETECTED Final   Candida albicans NOT DETECTED NOT DETECTED Final   Candida glabrata NOT DETECTED NOT DETECTED Final   Candida krusei NOT DETECTED NOT DETECTED Final   Candida parapsilosis NOT DETECTED NOT DETECTED Final   Candida tropicalis NOT DETECTED NOT DETECTED Final  Culture, blood (routine x 2)     Status: Abnormal (Preliminary result)   Collection Time: 09/08/17  9:20 AM  Result Value Ref Range Status   Specimen Description BLOOD RIGHT HAND  Final   Special Requests IN PEDIATRIC BOTTLE Blood Culture adequate volume  Final   Culture  Setup Time   Final    GRAM POSITIVE COCCI IN CLUSTERS IN PEDIATRIC BOTTLE CRITICAL RESULT CALLED TO, READ BACK BY AND VERIFIED WITH: PHARMD N BATCHELDER [id number] MLM    Culture (A)  Final    STAPHYLOCOCCUS AUREUS SUSCEPTIBILITIES TO FOLLOW  Report Status PENDING  Incomplete  Culture, blood (routine x 2)     Status: None (Preliminary result)   Collection Time: 09/08/17  9:28 AM  Result Value Ref Range Status   Specimen Description BLOOD LEFT HAND  Final   Special Requests IN PEDIATRIC BOTTLE Blood Culture adequate volume  Final   Culture NO GROWTH 2 DAYS  Final   Report Status PENDING  Incomplete  Culture, blood (routine x 2)     Status: None (Preliminary result)   Collection Time: 09/09/17 11:35 AM  Result Value Ref Range Status   Specimen Description BLOOD LEFT ARM  Final   Special Requests IN PEDIATRIC BOTTLE Blood Culture adequate volume  Final   Culture NO GROWTH < 24 HOURS  Final   Report Status PENDING  Incomplete  Culture, blood (routine x 2)     Status: None (Preliminary result)   Collection Time: 09/09/17 11:40 AM  Result Value Ref Range Status   Specimen Description BLOOD LEFT HAND  Final   Special Requests IN PEDIATRIC BOTTLE Blood Culture adequate volume  Final   Culture NO GROWTH < 24 HOURS  Final   Report Status PENDING  Incomplete    Radiology Studies: No results found. Scheduled Meds: . chlorhexidine  15 mL Mouth  Rinse BID  . cholecalciferol  2,000 Units Oral Daily  . collagenase   Topical Daily  . enoxaparin (LOVENOX) injection  30 mg Subcutaneous Q24H  . feeding supplement (ENSURE ENLIVE)  237 mL Oral BID BM  . feeding supplement (PRO-STAT SUGAR FREE 64)  30 mL Oral BID  . mouth rinse  15 mL Mouth Rinse q12n4p  . multivitamin with minerals  1 tablet Oral Daily  . senna-docusate  2 tablet Oral BID  . cyanocobalamin  1,000 mcg Oral Daily   Continuous Infusions: . DAPTOmycin (CUBICIN)  IV    . dextrose 5 % with KCl 20 mEq / L 20 mEq (09/10/17 0031)    LOS: 6 days   Kerney Elbe, DO [facility] Pager [phone]  If 7PM-7AM, please contact night-coverage [email or web address] Password [facility] 09/10/2017, 12:45 PM

## 2017-09-10 NOTE — Clinical Social Work Note (Signed)
Clinical Social Work Assessment  Patient Details  Name: Garrett Snow MRN: [id number] Date of Birth: 12-21-1918  Date of referral:  09/10/17               Reason for consult:  Facility Placement(from [facility])                Permission sought to share information with:  Facility Sport and exercise psychologist, Family Supports Permission granted to share information::  No(patient disoriented, son at bedside)  Name::     Garrett Snow  Agency::  [facility]  Relationship::  son  Contact Information:  [phone]  Housing/Transportation Living arrangements for the past 2 months:  [place], [place] of Information:  Adult Children Patient Interpreter Needed:  None Criminal Activity/Legal Involvement Pertinent to Current Situation/Hospitalization:  No - Comment as needed Significant Relationships:  Adult Children Lives with:  Adult Children Do you feel safe going back to the place where you live?  Yes Need for family participation in patient care:  Yes (Comment)  Care giving concerns: Patient lives at home but was in rehab at [facility] for about 3 weeks before re-admit to hospital.   Social Worker assessment / plan: CSW met with patient and son, Iona Beard at bedside. Patient disoriented, though interacted with CSW when prompted. Son indicated patient has been at [facility] for rehab, though they are now thinking patient may return home with hospice at discharge. Son stated patient would rather be at home. Family does not want to completely rule out return to [facility] though, pending patient's progress. Son is in touch with [facility] staff and stated the facility can take patient back. Son asked questions about discharge planning and CSW explained disposition planning process and CSW's role. CSW to follow for disposition planning and support as needed, pending return to SNF vs home.  Employment status:  Retired Forensic scientist:  Med[employer] PT  Recommendations:  Not assessed at this time [place] / Referral to community resources:  [place]  Patient/Family's Response to care: Son appreciative of care patient has received while admitted.  Patient/Family's Understanding of and Emotional Response to Diagnosis, Current Treatment, and Prognosis: Son with good understanding of patient's condition and considering disposition options.   Emotional Assessment Appearance:  Appears stated age Attitude/Demeanor/Rapport:  Lethargic Affect (typically observed):  Calm Orientation:  Oriented to Self Alcohol / Substance use:  Not Applicable Psych involvement (Current and /or in the community):  No (Comment)  Discharge Needs  Concerns to be addressed:  Discharge Planning Concerns, Care Coordination Readmission within the last 30 days:  Yes Current discharge risk:  Physical Impairment, Cognitively Impaired Barriers to Discharge:  Continued Medical Work up   Estanislado Emms, LCSW 09/10/2017, 1:21 PM

## 2017-09-10 NOTE — Progress Notes (Signed)
Nutrition Follow-up  DOCUMENTATION CODES:   Severe malnutrition in context of chronic illness  INTERVENTION:  Continue Ensure Enlive po BID, each supplement provides 350 kcal and 20 grams of protein  Continue Pro-stat 5mL BID, each supplement provides 100 calories and 15 grams of protein  Continue MVI daily  NUTRITION DIAGNOSIS:   Severe Malnutrition related to chronic illness(dementia) as evidenced by severe muscle depletion, severe fat depletion. -ongoing  GOAL:   Patient will meet greater than or equal to 90% of their needs -not meeting  MONITOR:   PO intake, Supplement acceptance, Labs, Weight trends, Skin, I & O's  ASSESSMENT:   84 yo male with PMH HTN, AAA, femoral neck fracture, DJD, skin tears was admitted on 1/16 from SNF with altered mental status, sepsis, and AKI superimposed on CKD  Spoke with patient and Son at bedside. According to Son, he was told patient ate ok this morning. Patient was unable to remember what he had for breakfast. Open bottle of ensure at bedside, seems patient drank about 25% of that as well. Encouraged intake of supplements. Meal completion 15% Patient was asking for bowl of grapes during visit. Re-iterated that patient is on NDD2 and will be unable to have grapes until diet is advanced.  Labs reviewed  Medications reviewed and include:  Vitamin D, Senokot-S, Vit B12 D5 20 K+ at 50mL/hr --> 204 calories  Diet Order:  Seizure precautions DIET DYS 2 Room service appropriate? Yes; Fluid consistency: Thin  EDUCATION NEEDS:   Not appropriate for education at this time  Skin:  Skin Assessment: Skin Integrity Issues: Skin Integrity Issues:: Unstageable, DTI, Other (Comment) DTI: Rt anterior foot, Left heel Stage I: NA Stage II: NA Unstageable: Rt heel Other: Skin tear; Abrasion; Ecchymosis arm, elbow, leg and back  Last BM:  09/09/2017  Height:   Ht Readings from Last 1 Encounters:  09/04/17 6' (1.829 m)    Weight:   Wt  Readings from Last 1 Encounters:  09/10/17 165 lb 5.5 oz (75 kg)    Ideal Body Weight:  80.7 kg  BMI:  Body mass index is 22.42 kg/m.  Estimated Nutritional Needs:   Kcal:  1,600-1,900 kcal  Protein:  95-105 gm  Fluid:  1.6-1.9 L  Garrett Anis. Chanese Hartsough, MS, RD LDN Inpatient Clinical Dietitian Pager [phone]

## 2017-09-10 NOTE — Progress Notes (Signed)
  Speech Language Pathology Treatment: Dysphagia  Patient Details Name: Garrett Snow MRN: [id number] DOB: 26-Feb-1919 Today's Date: 09/10/2017 Time: 3545-6256 SLP Time Calculation (min) (ACUTE ONLY): 18 min  Assessment / Plan / Recommendation Clinical Impression  Pt observed with limited intake today secondary to lethargy. Pt refused all solid trials and agreed to one straw sip of thin liquid. No signs or symptoms of aspiration with minimal PO trial. Max verbal cues were needed to encourage pt to have PO trial. RN reported low intake from breakfast. Son in the room during session reported that pt had recently been eating softer foods (scrambled eggs) prior to admission. Given ability to manipulate solids, though prolonged, observed in previous tx, recommend advancing pt's diet to Dysphagia 3 (mech soft) and thin liquid. Diet recommendation will give pt more options to hopefully increase PO intake. Pt will likely require assistance with meals and encouragement to attend to task of eating.    HPI HPI: Garrett P Smootis a 84 y.o.malewith medical history significant ofHTN, DJD, dementia, Recent Right Femoral Neck Fx s/p Hip Arthroplasty 12/26, Hx of AAA who presented to [name] from SNF with AMS andincreased work of breathing. Initial CXR showed PNA and patient was started on Augmentin at SNF. Admitted for sepsis and AMS.CXR 1/16 aortic atherosclerosis. No acute cardiopulmonary abnormality seen. Head CT subacute to chronic right convexity subdural hematoma, measuring up to 10 mm in thickness without associated midline shift or other mass effect.       SLP Plan  Continue with current plan of care       Recommendations  Diet recommendations: Dysphagia 3 (mechanical soft);Thin liquid Liquids provided via: Cup;Straw Medication Administration: Whole meds with puree Supervision: Staff to assist with self feeding Compensations: Slow rate;Small sips/bites Postural Changes and/or Swallow  Maneuvers: Seated upright 90 degrees                Oral Care Recommendations: Oral care BID Follow up Recommendations: None SLP Visit Diagnosis: Dysphagia, oral phase (R13.11) Plan: Continue with current plan of care       GO              [place] Amaira Safley SLP Student Clinician   [place] Terrence Wishon 09/10/2017, 3:30 PM

## 2017-09-10 NOTE — Consult Note (Signed)
[facility] for Infectious Disease   Reason for Consult: MRSA Bacteremia     Referring Physician: Dr. Alfredia Ferguson   Active Problems:   AAA (abdominal aortic aneurysm) ([place])   Fracture of femoral neck, right (HCC)   Hip fracture ([place])   Sepsis ([place])   Acute kidney injury superimposed on chronic kidney disease ([place])   Acute urinary retention   Localized swelling of both lower legs   Normocytic anemia   Leukocytosis   Pressure injury of skin   Protein-calorie malnutrition, severe   Hypokalemia   Hypophosphatemia   Elevated troponin   Elevated AST (SGOT)   . chlorhexidine  15 mL Mouth Rinse BID  . cholecalciferol  2,000 Units Oral Daily  . collagenase   Topical Daily  . enoxaparin (LOVENOX) injection  30 mg Subcutaneous Q24H  . feeding supplement (ENSURE ENLIVE)  237 mL Oral BID BM  . feeding supplement (PRO-STAT SUGAR FREE 64)  30 mL Oral BID  . mouth rinse  15 mL Mouth Rinse q12n4p  . multivitamin with minerals  1 tablet Oral Daily  . senna-docusate  2 tablet Oral BID  . cyanocobalamin  1,000 mcg Oral Daily    Recommendations: Continue IV damptomycin. Cultures from yesterday NGTD. Can place PICC once negative for 3 days. Will need 6 weeks of IV abx from negative culture date.   Assessment: Patient with MRSA bacteremia, unclear source. Recent right hip arthroplasty on 12/26 but imaging without evidence on infection and incision appears well healed. Ortho consult recommended non operative management. Also with multiple decubitus ulcers of his lower extremities that could be a source. On going goals of care discussion per primary, outpatient hospice consulted.   Antibiotics:  1/16 IV vancomycin >> dc'd 1/18 1/16 IV cefepime >> dc'd 1/18 1/18 IV Daptomycin >>   Cultures:  1/16 Blood cultures >> 1/2 with MRSA, 2/2 with staph aureus (sensitivies pending) 1/18 Blood cultures >> 1/2 with MRSA, 2/2 with NG x 2 days 1/20 >> 1/2 with staph aureus, 2/2 NGTD 1/21 Blood cultures  >> NG 24 hours  HPI: Garrett Snow is a 84 y.o. male with past medical history significant for HTN, colon cancer, recent right femoral neck fracture s/p hip arthroplasty 12/26, hx of AAA who presented on 1/16 from SNF with AMS and increased work of breathing. Per family, patient has dementia but is conversational at baseline. Work up at his living facility included a CXR concerning for PNA, and patient was started on Augmentin. He developed worsening AMS and hypotension and was sent to the ED. On arrival, patient was afebrile but hypotensive with BP 69/38. Oxygen saturation was 96% on 2L, HR 71, RR 22. CXR at that time was negative for acute abnormality. 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 Labs were notable for a leukocytosis of 20 and acute on chronic renal dysfunction, creatinine 2.8 from baseline 1.4. BP responded to IVF resuscitation and he was started on broad spectrum antibiotics with IV vancomycin and cefepime. Blood cultures collected in the ED grew MRSA. TTE was negative for evidence of vegetation. Repeat cultures thus far persistently positive. Xrays of left hip on 1/19 were negative.   Interval History: Sleeping comfortably this AM, no events overnight.   Past Medical History:  Diagnosis Date  . AAA (abdominal aortic aneurysm) ([place])   . DJD (degenerative joint disease) 03/07/2012  . Fracture of femoral neck, right ([place]) 08/14/2017  . HTN (hypertension),  benign 03/07/2012    Social History   Tobacco Use  . Smoking status: Former Smoker    Last attempt to quit: 08/19/1974    Years since quitting: 43.0  . Smokeless tobacco: Never Used  Substance Use Topics  . Alcohol use: No    Alcohol/week: 0.0 oz    Frequency: Never  . Drug use: No    No family history on file.  No Known Allergies  Physical Exam: Vitals:   09/09/17 2151 09/10/17 0513  BP: (!) 109/49 (!) 103/47  Pulse: 66 (!) 56  Resp: 17 17  Temp:  98 F (36.7 C) 98.2 F (36.8 C)  SpO2: 93% 98%   Constitutional: NAD, appears comfortable HEENT: Atraumatic, normocephalic. PERRL, anicteric sclera.  Neck: Supple, trachea midline.  Cardiovascular: RRR Pulmonary/Chest: CTAB anteriorly  Abdominal: Soft, non tender, non distended. +BS.  Extremities: Warm and well perfused. Distal pulses intact. No edema. LE well wrapped.  Neurological: Alert to person, not place. CN II - XII grossly intact.  Skin: No rashes or erythema  Psychiatric: Normal mood and affect   Lab Results  Component Value Date   WBC 7.5 09/10/2017   HGB 9.1 (L) 09/10/2017   HCT 29.4 (L) 09/10/2017   MCV 94.5 09/10/2017   PLT 286 09/10/2017    Lab Results  Component Value Date   CREATININE 1.48 (H) 09/10/2017   BUN 39 (H) 09/10/2017   NA 143 09/10/2017   K 4.0 09/10/2017   CL 111 09/10/2017   CO2 24 09/10/2017    Lab Results  Component Value Date   ALT 41 09/10/2017   AST 32 09/10/2017   ALKPHOS 145 (H) 09/10/2017     Microbiology: Recent Results (from the past 240 hour(s))  Culture, blood (Routine x 2)     Status: Abnormal   Collection Time: 09/04/17  1:15 PM  Result Value Ref Range Status   Specimen Description BLOOD LEFT FOREARM  Final   Special Requests   Final    BOTTLES DRAWN AEROBIC AND ANAEROBIC Blood Culture adequate volume   Culture  Setup Time   Final    GRAM POSITIVE COCCI AEROBIC BOTTLE ONLY CRITICAL VALUE NOTED.  VALUE IS CONSISTENT WITH PREVIOUSLY REPORTED AND CALLED VALUE.    Culture (A)  Final    STAPHYLOCOCCUS AUREUS SUSCEPTIBILITIES PERFORMED ON PREVIOUS CULTURE WITHIN THE LAST 5 DAYS.    Report Status 09/10/2017 FINAL  Final  Culture, blood (Routine x 2)     Status: Abnormal   Collection Time: 09/04/17  1:23 PM  Result Value Ref Range Status   Specimen Description BLOOD ARM RIGHT  Final   Special Requests IN PEDIATRIC BOTTLE Blood Culture adequate volume  Final   Culture  Setup Time   Final    GRAM POSITIVE COCCI IN  PEDIATRIC BOTTLE CRITICAL RESULT CALLED TO, READ BACK BY AND VERIFIED WITH: G ABBOTT PHARMD 0149 09/06/17 A BROWNING    Culture METHICILLIN RESISTANT STAPHYLOCOCCUS AUREUS (A)  Final   Report Status 09/08/2017 FINAL  Final   Organism ID, Bacteria METHICILLIN RESISTANT STAPHYLOCOCCUS AUREUS  Final      Susceptibility   Methicillin resistant staphylococcus aureus - MIC*    CIPROFLOXACIN >=8 RESISTANT Resistant     ERYTHROMYCIN <=0.25 SENSITIVE Sensitive     GENTAMICIN <=0.5 SENSITIVE Sensitive     OXACILLIN RESISTANT Resistant     TETRACYCLINE <=1 SENSITIVE Sensitive     VANCOMYCIN 1 SENSITIVE Sensitive     TRIMETH/SULFA <=10 SENSITIVE Sensitive  CLINDAMYCIN <=0.25 SENSITIVE Sensitive     RIFAMPIN <=0.5 SENSITIVE Sensitive     Inducible Clindamycin NEGATIVE Sensitive     * METHICILLIN RESISTANT STAPHYLOCOCCUS AUREUS  Blood Culture ID Panel (Reflexed)     Status: Abnormal   Collection Time: 09/04/17  1:23 PM  Result Value Ref Range Status   Enterococcus species NOT DETECTED NOT DETECTED Final   Listeria monocytogenes NOT DETECTED NOT DETECTED Final   Staphylococcus species DETECTED (A) NOT DETECTED Final    Comment: CRITICAL RESULT CALLED TO, READ BACK BY AND VERIFIED WITH: G ABBOTT PHARMD 0149 09/06/17 A BROWNING    Staphylococcus aureus DETECTED (A) NOT DETECTED Final    Comment: Methicillin (oxacillin)-resistant Staphylococcus aureus (MRSA). MRSA is predictably resistant to beta-lactam antibiotics (except ceftaroline). Preferred therapy is vancomycin unless clinically contraindicated. Patient requires contact precautions if  hospitalized. CRITICAL RESULT CALLED TO, READ BACK BY AND VERIFIED WITH: G ABBOTT PHARMD 0149 09/06/17 A BROWNING    Methicillin resistance DETECTED (A) NOT DETECTED Final    Comment: CRITICAL RESULT CALLED TO, READ BACK BY AND VERIFIED WITH: G ABBOTT PHARMD 0149 09/06/17 A BROWNING    Streptococcus species NOT DETECTED NOT DETECTED Final   Streptococcus  agalactiae NOT DETECTED NOT DETECTED Final   Streptococcus pneumoniae NOT DETECTED NOT DETECTED Final   Streptococcus pyogenes NOT DETECTED NOT DETECTED Final   Acinetobacter baumannii NOT DETECTED NOT DETECTED Final   Enterobacteriaceae species NOT DETECTED NOT DETECTED Final   Enterobacter cloacae complex NOT DETECTED NOT DETECTED Final   Escherichia coli NOT DETECTED NOT DETECTED Final   Klebsiella oxytoca NOT DETECTED NOT DETECTED Final   Klebsiella pneumoniae NOT DETECTED NOT DETECTED Final   Proteus species NOT DETECTED NOT DETECTED Final   Serratia marcescens NOT DETECTED NOT DETECTED Final   Haemophilus influenzae NOT DETECTED NOT DETECTED Final   Neisseria meningitidis NOT DETECTED NOT DETECTED Final   Pseudomonas aeruginosa NOT DETECTED NOT DETECTED Final   Candida albicans NOT DETECTED NOT DETECTED Final   Candida glabrata NOT DETECTED NOT DETECTED Final   Candida krusei NOT DETECTED NOT DETECTED Final   Candida parapsilosis NOT DETECTED NOT DETECTED Final   Candida tropicalis NOT DETECTED NOT DETECTED Final  Urine culture     Status: None   Collection Time: 09/04/17  5:42 PM  Result Value Ref Range Status   Specimen Description URINE, RANDOM  Final   Special Requests NONE  Final   Culture NO GROWTH  Final   Report Status 09/05/2017 FINAL  Final  MRSA PCR Screening     Status: None   Collection Time: 09/04/17 10:11 PM  Result Value Ref Range Status   MRSA by PCR NEGATIVE NEGATIVE Final    Comment:        The GeneXpert MRSA Assay (FDA approved for NASAL specimens only), is one component of a comprehensive MRSA colonization surveillance program. It is not intended to diagnose MRSA infection nor to guide or monitor treatment for MRSA infections.   Culture, blood (routine x 2)     Status: None (Preliminary result)   Collection Time: 09/06/17  8:12 AM  Result Value Ref Range Status   Specimen Description BLOOD RIGHT FOREARM  Final   Special Requests   Final     BOTTLES DRAWN AEROBIC ONLY Blood Culture adequate volume   Culture NO GROWTH 4 DAYS  Final   Report Status PENDING  Incomplete  Culture, blood (routine x 2)     Status: Abnormal   Collection Time: 09/06/17  8:16  AM  Result Value Ref Range Status   Specimen Description BLOOD RIGHT HAND  Final   Special Requests IN PEDIATRIC BOTTLE Blood Culture adequate volume  Final   Culture  Setup Time   Final    GRAM POSITIVE COCCI IN PEDIATRIC BOTTLE CRITICAL RESULT CALLED TO, READ BACK BY AND VERIFIED WITH: G ABBOTT PHARMD 0023 09/07/17 A BROWNING    Culture (A)  Final    STAPHYLOCOCCUS AUREUS SUSCEPTIBILITIES PERFORMED ON PREVIOUS CULTURE WITHIN THE LAST 5 DAYS.    Report Status 09/08/2017 FINAL  Final  Blood Culture ID Panel (Reflexed)     Status: Abnormal   Collection Time: 09/06/17  8:16 AM  Result Value Ref Range Status   Enterococcus species NOT DETECTED NOT DETECTED Final   Listeria monocytogenes NOT DETECTED NOT DETECTED Final   Staphylococcus species DETECTED (A) NOT DETECTED Final    Comment: CRITICAL RESULT CALLED TO, READ BACK BY AND VERIFIED WITH: G ABBOTT PHARMD 0023 09/07/17 A BROWNING    Staphylococcus aureus DETECTED (A) NOT DETECTED Final    Comment: Methicillin (oxacillin)-resistant Staphylococcus aureus (MRSA). MRSA is predictably resistant to beta-lactam antibiotics (except ceftaroline). Preferred therapy is vancomycin unless clinically contraindicated. Patient requires contact precautions if  hospitalized. CRITICAL RESULT CALLED TO, READ BACK BY AND VERIFIED WITH: G ABBOTT PHARMD 0023 09/07/17 A BROWNING    Methicillin resistance DETECTED (A) NOT DETECTED Final    Comment: CRITICAL RESULT CALLED TO, READ BACK BY AND VERIFIED WITH: G ABBOTT PHARMD 0023 09/07/17 A BROWNING    Streptococcus species NOT DETECTED NOT DETECTED Final   Streptococcus agalactiae NOT DETECTED NOT DETECTED Final   Streptococcus pneumoniae NOT DETECTED NOT DETECTED Final   Streptococcus pyogenes NOT  DETECTED NOT DETECTED Final   Acinetobacter baumannii NOT DETECTED NOT DETECTED Final   Enterobacteriaceae species NOT DETECTED NOT DETECTED Final   Enterobacter cloacae complex NOT DETECTED NOT DETECTED Final   Escherichia coli NOT DETECTED NOT DETECTED Final   Klebsiella oxytoca NOT DETECTED NOT DETECTED Final   Klebsiella pneumoniae NOT DETECTED NOT DETECTED Final   Proteus species NOT DETECTED NOT DETECTED Final   Serratia marcescens NOT DETECTED NOT DETECTED Final   Haemophilus influenzae NOT DETECTED NOT DETECTED Final   Neisseria meningitidis NOT DETECTED NOT DETECTED Final   Pseudomonas aeruginosa NOT DETECTED NOT DETECTED Final   Candida albicans NOT DETECTED NOT DETECTED Final   Candida glabrata NOT DETECTED NOT DETECTED Final   Candida krusei NOT DETECTED NOT DETECTED Final   Candida parapsilosis NOT DETECTED NOT DETECTED Final   Candida tropicalis NOT DETECTED NOT DETECTED Final  Culture, blood (routine x 2)     Status: Abnormal (Preliminary result)   Collection Time: 09/08/17  9:20 AM  Result Value Ref Range Status   Specimen Description BLOOD RIGHT HAND  Final   Special Requests IN PEDIATRIC BOTTLE Blood Culture adequate volume  Final   Culture  Setup Time   Final    GRAM POSITIVE COCCI IN CLUSTERS IN PEDIATRIC BOTTLE CRITICAL RESULT CALLED TO, READ BACK BY AND VERIFIED WITH: PHARMD Karlene Einstein [id number] MLM    Culture (A)  Final    STAPHYLOCOCCUS AUREUS SUSCEPTIBILITIES TO FOLLOW    Report Status PENDING  Incomplete  Culture, blood (routine x 2)     Status: None (Preliminary result)   Collection Time: 09/08/17  9:28 AM  Result Value Ref Range Status   Specimen Description BLOOD LEFT HAND  Final   Special Requests IN PEDIATRIC BOTTLE Blood Culture adequate volume  Final   Culture NO GROWTH 2 DAYS  Final   Report Status PENDING  Incomplete  Culture, blood (routine x 2)     Status: None (Preliminary result)   Collection Time: 09/09/17 11:35 AM  Result Value Ref  Range Status   Specimen Description BLOOD LEFT ARM  Final   Special Requests IN PEDIATRIC BOTTLE Blood Culture adequate volume  Final   Culture NO GROWTH < 24 HOURS  Final   Report Status PENDING  Incomplete  Culture, blood (routine x 2)     Status: None (Preliminary result)   Collection Time: 09/09/17 11:40 AM  Result Value Ref Range Status   Specimen Description BLOOD LEFT HAND  Final   Special Requests IN PEDIATRIC BOTTLE Blood Culture adequate volume  Final   Culture NO GROWTH < 24 HOURS  Final   Report Status PENDING  Incomplete    [name], MD - [place] for Infectious Disease [facility] [email or web address] [phone] pager  [phone] cell 09/10/2017, 11:29 AM

## 2017-09-11 LAB — CBC WITH DIFFERENTIAL/PLATELET
BASOS ABS: 0 10*3/uL (ref 0.0–0.1)
Basophils Relative: 0 %
EOS PCT: 3 %
Eosinophils Absolute: 0.2 10*3/uL (ref 0.0–0.7)
HEMATOCRIT: 28.3 % — AB (ref 39.0–52.0)
Hemoglobin: 8.7 g/dL — ABNORMAL LOW (ref 13.0–17.0)
LYMPHS ABS: 1.7 10*3/uL (ref 0.7–4.0)
LYMPHS PCT: 20 %
MCH: 28.8 pg (ref 26.0–34.0)
MCHC: 30.7 g/dL (ref 30.0–36.0)
MCV: 93.7 fL (ref 78.0–100.0)
Monocytes Absolute: 0.5 10*3/uL (ref 0.1–1.0)
Monocytes Relative: 6 %
NEUTROS ABS: 5.9 10*3/uL (ref 1.7–7.7)
Neutrophils Relative %: 71 %
Platelets: 331 10*3/uL (ref 150–400)
RBC: 3.02 MIL/uL — AB (ref 4.22–5.81)
RDW: 15.2 % (ref 11.5–15.5)
WBC: 8.3 10*3/uL (ref 4.0–10.5)

## 2017-09-11 LAB — CULTURE, BLOOD (ROUTINE X 2)
CULTURE: NO GROWTH
SPECIAL REQUESTS: ADEQUATE

## 2017-09-11 LAB — COMPREHENSIVE METABOLIC PANEL
ALT: 38 U/L (ref 17–63)
AST: 37 U/L (ref 15–41)
Albumin: 1.6 g/dL — ABNORMAL LOW (ref 3.5–5.0)
Alkaline Phosphatase: 134 U/L — ABNORMAL HIGH (ref 38–126)
Anion gap: 8 (ref 5–15)
BILIRUBIN TOTAL: 0.6 mg/dL (ref 0.3–1.2)
BUN: 37 mg/dL — AB (ref 6–20)
CALCIUM: 8.9 mg/dL (ref 8.9–10.3)
CO2: 24 mmol/L (ref 22–32)
Chloride: 109 mmol/L (ref 101–111)
Creatinine, Ser: 1.53 mg/dL — ABNORMAL HIGH (ref 0.61–1.24)
GFR, EST AFRICAN AMERICAN: 42 mL/min — AB (ref >60)
GFR, EST NON AFRICAN AMERICAN: 37 mL/min — AB (ref >60)
Glucose, Bld: 107 mg/dL — ABNORMAL HIGH (ref 65–99)
Potassium: 3.9 mmol/L (ref 3.5–5.1)
Sodium: 141 mmol/L (ref 135–145)
Total Protein: 4.7 g/dL — ABNORMAL LOW (ref 6.5–8.1)

## 2017-09-11 LAB — MAGNESIUM: MAGNESIUM: 2.1 mg/dL (ref 1.7–2.4)

## 2017-09-11 LAB — PHOSPHORUS: PHOSPHORUS: 3.1 mg/dL (ref 2.5–4.6)

## 2017-09-11 NOTE — Progress Notes (Signed)
PHARMACY CONSULT NOTE FOR:  OUTPATIENT  PARENTERAL ANTIBIOTIC THERAPY (OPAT)  Indication: MRSA bacteremia  Regimen: Daptomycin 750 mg every 48 hours  End date: 10/20/17  IV antibiotic discharge orders are pended. To discharging provider:  please sign these orders via discharge navigator,  Select New Orders & click on the button choice - Manage This Unsigned Work.     Thank you for allowing pharmacy to be a part of this patient's care.  Jimmy Footman, PharmD, BCPS PGY2 Infectious Diseases Pharmacy Resident Pager: [phone]  09/11/2017, 1:45 PM

## 2017-09-11 NOTE — Progress Notes (Addendum)
Patient seen and evaluated, he is now actually much better and interacting normally with me.  His wounds appear to be healing well, EHL intact, feet are protected in the boots.  Appreciate the excellent medical care, he seems to be improving clinically.  He can follow-up with me in the next couple of weeks if he gets out of the hospital.  Spoke with family, [name], and left vm for [employer].  Garrett Bridge, MD

## 2017-09-11 NOTE — Progress Notes (Signed)
    [name] Infect[facility]    Date of Admission:  09/04/2017      ID: Garrett Snow is a 84 y.o. male with  past medical history significant for HTN, colon cancer, recent right femoral neck fracture s/p hip arthroplasty 12/26, hx of AAA who presented on 1/16 from SNF with AMS and increased work of breathing. Found to have MRSA bacteremia, unclear source. Possibly 2/2 to infected hip arthroplasty vs. Lower extremity pressure ulcers. TTE was negative. Given age and risk factors, TEE was not performed.   Antibiotics:  1/16 IV vancomycin >> dc'd 1/18 1/16 IV cefepime >> dc'd 1/18 1/18 IV Daptomycin >>   Cultures:  1/16 Blood cultures >> MRSA 1/18 Blood cultures >> MRSA 1/20 Blood cultures >> MRSA 1/21 Blood cultures >> NG x 2 days  Subjective: Improving clinically, back to baseline mental status.   Medications:  . chlorhexidine  15 mL Mouth Rinse BID  . cholecalciferol  2,000 Units Oral Daily  . collagenase   Topical Daily  . enoxaparin (LOVENOX) injection  30 mg Subcutaneous Q24H  . feeding supplement (ENSURE ENLIVE)  237 mL Oral BID BM  . feeding supplement (PRO-STAT SUGAR FREE 64)  30 mL Oral BID  . mouth rinse  15 mL Mouth Rinse q12n4p  . multivitamin with minerals  1 tablet Oral Daily  . senna-docusate  2 tablet Oral BID  . cyanocobalamin  1,000 mcg Oral Daily    Objective: Vital signs in last 24 hours: Temp:  [97.7 F (36.5 C)-98.3 F (36.8 C)] 97.7 F (36.5 C) (01/23 0603) Pulse Rate:  [65-72] 65 (01/23 0603) Resp:  [18-20] 18 (01/23 0603) BP: (103-115)/(48-51) 106/51 (01/23 0603) SpO2:  [94 %-95 %] 94 % (01/23 0603) Weight:  [168 lb 14 oz (76.6 kg)] 168 lb 14 oz (76.6 kg) (01/23 0121)  Constitutional: NAD, appears comfortable HEENT: Atraumatic, normocephalic. PERRL, anicteric sclera.  Neck: Supple, trachea midline.  Cardiovascular: RRR Pulmonary/Chest: CTAB anteriorly  Abdominal: Soft, non tender, non distended. +BS.  Extremities: Warm and well  perfused. Distal pulses intact. No edema. LE well wrapped.  Neurological: Alert to person, not place. CN II - XII grossly intact.  Skin: No rashes or erythema  Psychiatric: Normal mood and affect     Lab Results Recent Labs    09/10/17 0456 09/11/17 0651  WBC 7.5 8.3  HGB 9.1* 8.7*  HCT 29.4* 28.3*  NA 143 141  K 4.0 3.9  CL 111 109  CO2 24 24  BUN 39* 37*  CREATININE 1.48* 1.53*   Liver Panel Recent Labs    09/10/17 0456 09/11/17 0651  PROT 4.6* 4.7*  ALBUMIN 1.6* 1.6*  AST 32 37  ALT 41 38  ALKPHOS 145* 134*  BILITOT 0.5 0.6    Assessment/Plan: Continue IV daptomycin. Can place PICC tomorrow if cultures remain negative x 72 hours. Plan to discharge back to SNF. Will need 6 weeks of abx from negative culture date.   Garrett Snow - PGY [facility] Infect[facility] Cell: [phone] Pager: [phone]  09/11/2017, 10:50 AM

## 2017-09-11 NOTE — NC FL2 (Addendum)
MEDICAID FL2 LEVEL OF CARE SCREENING TOOL     IDENTIFICATION  Patient Name: Garrett Snow Birthdate: Sep 01, 1918 Sex: male Admission Date (Current Location): 09/04/2017  [facility] and [place] Number:  [employer] and Address:  [facility], [place] [address], [place], [place] [address]      Provider Number: [id number]  Attending Physician Name and Address:  Cristal Ford, DO  Relative Name and Phone Number:       Current Level of Care: Hospital Recommended Level of Care: [place] Prior Approval Number:    Date Approved/Denied:   PASRR Number:   [id number]  Discharge Plan: SNF    Current Diagnoses: Patient Active Problem List   Diagnosis Date Noted  . Elevated troponin 09/07/2017  . Elevated AST (SGOT) 09/07/2017  . Hypokalemia 09/06/2017  . Hypophosphatemia 09/06/2017  . Pressure injury of skin 09/05/2017  . Protein-calorie malnutrition, severe 09/05/2017  . Sepsis ([place]) 09/04/2017  . Acute kidney injury superimposed on chronic kidney disease ([place]) 09/04/2017  . Acute urinary retention 09/04/2017  . Localized swelling of both lower legs 09/04/2017  . Normocytic anemia 09/04/2017  . Leukocytosis 09/04/2017  . Fracture of femoral neck, right ([place]) 08/14/2017  . Hip fracture ([place]) 08/14/2017  . AAA (abdominal aortic aneurysm) ([place])   . Stage III carcinoma of colon ([place]) 03/07/2012  . HTN (hypertension), benign 03/07/2012  . DJD (degenerative joint disease) 03/07/2012    Orientation RESPIRATION BLADDER Height & Weight     Self  Normal Incontinent, External catheter Weight: 168 lb 14 oz (76.6 kg) Height:  6' (182.9 cm)  BEHAVIORAL SYMPTOMS/MOOD NEUROLOGICAL BOWEL NUTRITION STATUS      Continent Diet(see DC summary)  AMBULATORY STATUS COMMUNICATION OF NEEDS Skin   Extensive Assist Verbally PU Stage and Appropriate Care, Other (Comment)(unstageable on heel- dressing changes every 3 days foam dressing)   PU  Stage 2 Dressing: (located on back and sacrum- foam dressing)                   Personal Care Assistance Level of Assistance  Bathing, Dressing Bathing Assistance: Maximum assistance Feeding assistance: Maximum assistance Dressing Assistance: Maximum assistance     Functional Limitations Info             SPECIAL CARE FACTORS FREQUENCY  PT (By licensed PT), OT (By licensed OT)     PT Frequency: 5/wk OT Frequency: 5/wk            Contractures      Additional Factors Info  Code Status, Allergies, Isolation Precautions Code Status Info: FULL Allergies Info: NKA     Isolation Precautions Info: MRSA     Current Medications (09/11/2017):  This is the current hospital active medication list Current Facility-Administered Medications  Medication Dose Route Frequency Provider Last Rate Last Dose  . acetaminophen (TYLENOL) tablet 650 mg  650 mg Oral Q6H PRN Sheikh, Omair Latif, DO      . chlorhexidine (PERIDEX) 0.12 % solution 15 mL  15 mL Mouth Rinse BID Sheikh, Omair Latif, DO   15 mL at 09/11/17 1010  . cholecalciferol (VITAMIN D) tablet 2,000 Units  2,000 Units Oral Daily Raiford Noble [place], DO   2,000 Units at 09/11/17 1011  . collagenase (SANTYL) ointment   Topical Daily Raiford Noble [place], [place]      . DAPTOmycin (CUBICIN) 750 mg in sodium chloride 0.9 % IVPB  750 mg Intravenous Q48H Susa Raring, [facility]   Stopped  at 09/10/17 1441  . dextrose 5 % with KCl 20 mEq / L  infusion  20 mEq Intravenous Continuous Raiford Noble [place], DO 50 mL/hr at 09/11/17 1328 20 mEq at 09/11/17 1328  . enoxaparin (LOVENOX) injection 30 mg  30 mg Subcutaneous Q24H Raiford Noble [place], DO   30 mg at 09/10/17 2208  . feeding supplement (ENSURE ENLIVE) (ENSURE ENLIVE) liquid 237 mL  237 mL Oral BID BM Sheikh, Omair Latif, DO   237 mL at 09/11/17 1328  . feeding supplement (PRO-STAT SUGAR FREE 64) liquid 30 mL  30 mL Oral BID Sheikh, Omair Latif, DO   30 mL at 09/11/17 1010  . MEDLINE mouth  rinse  15 mL Mouth Rinse [address] [place], DO   15 mL at 09/11/17 1329  . multivitamin with minerals tablet 1 tablet  1 tablet Oral Daily Raiford Noble [place], DO   1 tablet at 09/11/17 1011  . senna-docusate (Senokot-S) tablet 2 tablet  2 tablet Oral BID Raiford Noble [place], DO   2 tablet at 09/11/17 1011  . sodium chloride (OCEAN) 0.65 % nasal spray 1 spray  1 spray Each Nare PRN Sheikh, Omair Latif, DO      . vitamin B-12 (CYANOCOBALAMIN) tablet 1,000 mcg  1,000 mcg Oral Daily Raiford Noble Latif, DO   1,000 mcg at 09/11/17 1011     Discharge Medications: Please see discharge summary for a list of discharge medications.  Relevant Imaging Results:  Relevant Lab Results:   Additional Information SSN  [id number]; will need 6 weeks IV cubicin 750mg  q 48 hours  [place], [place] H, LCSW

## 2017-09-11 NOTE — Progress Notes (Signed)
Wound care was done, all foams were changed and santyl ointment was used.

## 2017-09-11 NOTE — Progress Notes (Signed)
SLP Cancellation Note  Patient Details Name: Garrett Snow MRN: [id number] DOB: [birth year]   Cancelled treatment:       Reason Eval/Treat Not Completed: Patient politely declined all POs offered. He does appear to have eaten a small amount (~25%) of the sandwich on his tray, and he denies any difficulty with that. SLP will continue to follow.   Germain Osgood 09/11/2017, 2:36 PM  Germain Osgood, M.A. CCC-SLP [phone]

## 2017-09-11 NOTE — Progress Notes (Signed)
PROGRESS NOTE    Garrett Snow  HQI:[id number] DOB: Sep 04, 1918 DOA: 09/04/2017 PCP: [name], MD   Chief Complaint  Patient presents with  . Altered Mental Status    Brief Narrative:  Garrett P Smootis a 84 y.o.malewith medical history significant ofHTN, DJD, Recent Right Femoral Neck Fx s/p Hip Arthroplasty 12/26, Hx of AAA, and other comorbids who presented to [name] from SNF with AMS andincreased work of breathing. Per family patient at baseline has some dementia but is able to carry on a conversation to an extent. Patient's family noticed a decline yesterday morning and noticed that he was more confused than baseline. Work up performed at [name] included a CXR which showed PNA and patient was started on Augmentin. Because of worsening confusion and Hypotension, he was sent to the ED for evaluation. Upon my evaluation patient is confused but is able to answer some questions. Denies any Pain. TRH was asked to admit for Sepsis and AMS.Patient's Blood Cx grew 1/2 MRSA so will repeat Blood Cx and likely narrow just to Vancomycin and Infectious Diseases was automatically consulted because of positive BCID. ID Changed Abx to Vancomycin. Blood Cultures remained persistently positive but finally showed NGTD <24 hours from Cx obtained 09/09/17; If remains Negative at 3 days will place PICC Line and D/C Back to SNF as patient is other wise medically stable.   Assessment & Plan   Sepsis secondary to MRSA bacteremia -Patient was hypotensive, tachypneic with leukocytosis on admission -Patient was diagnosed with pneumonia [facility] facility in [place] -Patient was placed on IV fluids -Blood cultures on 09/06/2017, 09/08/2017 were positive for MRSA -Repeat blood cultures on 09/09/2017 show no growth to date. -Currently unclear source of bacteremia -Echocardiogram did not mention any vegetations, mildly thickened leaflets. Given age, TEE would not be pursued at this time. -Right hip x-ray showed  replacement in good position. No acute fractures or dislocations. 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, who did not recommend intervention at this time and did not feel patient's sudural hygroma was the cause of patient's encephalopathy -hypernatremia improved -continue treatment of infection  Hypotension with history of hypertension -Amlodipine, Proscar held -BP appears to be improving  Acute kidney injury and chronic kidney stage III -Resolved -Baseline creatinine approximately 1.4-1.5, on admission creatinine 2.8 -Creatinine currently 1.53 -Renal ultrasound showed no hydronephrosis. -Continue to monitor BMP  Acute urinary retention  -Had 646 mL noted on bladder scan and required only catheter placement. 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 by previous hospitalist recommended IR for other modalities and question of IVC filter -Previous as was discussed with [name] [place] who recommended no IVC filter because occlusion above renal vein. No appropriate imaging can be done as contrast cannot be used to to  kidney function. Cannot exclude  tumor.  Recent right femoral neck fracture -Status post heme arthroplasty by [name] on 08/14/2017 -Right hip x-ray showed right hip replacement in good position with no acute fractures or dislocations. -Continue DVT prophylaxis  Bilateral lower extremity wounds/ulcers -Continue wound care  Normocytic anemia -Hemoglobin appears to be stable, currently 8.7 -continue monitor CBC  History of AAA -Continue to monitor as an outpatient  Hypernatremia/hyperchloremia -Resolved, continue to monitor BMP  Hypokalemia -Resolved, continue monitor BMP  Hypophosphatemia -Resolved, continue to monitor  Elevated troponin -Likely demand ischemia in the setting of sepsis -Troponin trending downward  Elevated AST -Resolved, continue to monitor CMP  Severe malnutrition a context of chronic illness -Nutrition consult and appreciated, continue supplementation  DVT Prophylaxis  SCDs  Code Status: DNR  Family Communication: None at bedside  Disposition Plan: Admitted.   Consultants Vascular surgery, [name], via phone Interventional radiology, [name], via hpone Infectious disease Orthopedic surgery  Procedures  Echocardiogram Lower extremity doppler  Antibiotics   Anti-infectives (From admission, onward)   Start     Dose/Rate Route Frequency Ordered Stop   09/10/17 1400  DAPTOmycin (CUBICIN) 764 mg in sodium chloride 0.9 % IVPB  Status:  Discontinued     10 mg/kg  76.4 kg 224.4 mL/hr over 30 Minutes Intravenous Every 48 hours 09/09/17 1129 09/09/17 1406   09/10/17 1400  DAPTOmycin (CUBICIN) 750 mg in sodium chloride 0.9 % IVPB     750 mg 224.4 mL/hr over 30 Minutes Intravenous Every 48 hours 09/09/17 1406     09/06/17 1600  vancomycin (VANCOCIN) IVPB 1000 mg/200 mL premix  Status:  Discontinued     1,000 mg 200 mL/hr over 60 Minutes Intravenous Every 48 hours 09/04/17 1528 09/06/17 1101   09/06/17 1400  vancomycin (VANCOCIN) IVPB 750 mg/150 ml premix  Status:   Discontinued     750 mg 150 mL/hr over 60 Minutes Intravenous Every 24 hours 09/06/17 1101 09/06/17 1149   09/06/17 1400  DAPTOmycin (CUBICIN) 611 mg in sodium chloride 0.9 % IVPB  Status:  Discontinued     8 mg/kg  76.4 kg 224.4 mL/hr over 30 Minutes Intravenous Every 48 hours 09/06/17 1149 09/09/17 1129   09/05/17 1600  ceFEPIme (MAXIPIME) 1 g in dextrose 5 % 50 mL IVPB  Status:  Discontinued     1 g 100 mL/hr over 30 Minutes Intravenous Every 24 hours 09/04/17 1528 09/06/17 1003   09/04/17 1530  vancomycin (VANCOCIN) 1,500 mg in sodium chloride 0.9 % 500 mL IVPB     1,500 mg 250 mL/hr over 120 Minutes Intravenous STAT 09/04/17 1528 09/04/17 1911   09/04/17 1530  ceFEPIme (MAXIPIME) 2 g in dextrose 5 % 50 mL IVPB     2 g 100 mL/hr over 30 Minutes Intravenous STAT 09/04/17 1528 09/04/17 1713      Subjective:   Agapito Myung seen and examined today.  No complaints today. Not very interactive.  Objective:   Vitals:   09/10/17 1436 09/10/17 2150 09/11/17 0121 09/11/17 0603  BP: (!) 115/50 (!) 103/48  (!) 106/51  Pulse: 72 70  65  Resp: 18 20  18   Temp: 97.8 F (36.6 C) 98.3 F (36.8 C)  97.7 F (36.5 C)  TempSrc: Oral Oral  Oral  SpO2: 94% 95%  94%  Weight:   76.6 kg (168 lb 14 oz)   Height:        Intake/Output Summary (Last 24 hours) at 09/11/2017 1321 Last data filed at  09/11/2017 1000 Gross per 24 hour  Intake 530 ml  Output 780 ml  Net -250 ml   Filed Weights   09/09/17 0140 09/10/17 0513 09/11/17 0121  Weight: 77.9 kg (171 lb 11.8 oz) 75 kg (165 lb 5.5 oz) 76.6 kg (168 lb 14 oz)    Exam  General: Well developed, elderly, no apparent distress  HEENT: NCAT, mucous membranes moist.   Cardiovascular: S1 S2 auscultated, Regular rate and rhythm.  Respiratory: Clear to auscultation bilaterally with equal chest rise  Abdomen: Soft, nontender, nondistended, + bowel sounds  Extremities: warm dry without cyanosis clubbing. Mild lower extremity edema. In  boots.  Neuro: AAOx2, nonfocal  Skin: Without rashes exudates or nodules. Sibert keratosis throughout body. Lower extremity pressure ulcers noted.  Psych: appropriate   Data Reviewed: I have personally reviewed following labs and imaging studies  CBC: Recent Labs  Lab 09/07/17 0947 09/08/17 0355 09/09/17 0340 09/10/17 0456 09/11/17 0651  WBC 9.7 8.4 10.4 7.5 8.3  NEUTROABS 7.0 6.2 7.2 5.3 5.9  HGB 9.6* 9.5* 10.2* 9.1* 8.7*  HCT 30.6* 30.2* 31.2* 29.4* 28.3*  MCV 93.0 94.1 93.7 94.5 93.7  PLT 187 223 259 286 939   Basic Metabolic Panel: Recent Labs  Lab 09/07/17 0947 09/08/17 0355 09/09/17 0340 09/10/17 0456 09/11/17 0651  NA 145 146* 146* 143 141  K 4.0 3.9 4.0 4.0 3.9  CL 115* 114* 114* 111 109  CO2 20* 23 22 24 24   GLUCOSE 118* 113* 114* 112* 107*  BUN 34* 34* 38* 39* 37*  CREATININE 1.40* 1.37* 1.45* 1.48* 1.53*  CALCIUM 8.7* 8.9 8.9 9.0 8.9  MG 2.1 2.0 2.1 2.2 2.1  PHOS 2.3* 2.6 2.6 3.2 3.1   GFR: Estimated Creatinine Clearance: 30.6 mL/min (A) (by C-G formula based on SCr of 1.53 mg/dL (H)). Liver Function Tests: Recent Labs  Lab 09/07/17 0947 09/08/17 0355 09/09/17 0340 09/10/17 0456 09/11/17 0651  AST 64* 54* 76* 32 37  ALT 43 46 62 41 38  ALKPHOS 156* 178* 189* 145* 134*  BILITOT 0.9 0.8 0.7 0.5 0.6  PROT 4.7* 4.6* 5.0* 4.6* 4.7*  ALBUMIN 1.8* 1.7* 1.8* 1.6* 1.6*   No results for input(s): LIPASE, AMYLASE in the last 168 hours. No results for input(s): AMMONIA in the last 168 hours. Coagulation Profile: No results for input(s): INR, PROTIME in the last 168 hours. Cardiac Enzymes: Recent Labs  Lab 09/04/17 2137 09/05/17 0115 09/05/17 0741 09/06/17 0357  CKTOTAL  --   --   --  34*  TROPONINI 0.23* 0.20* 0.15*  --    BNP (last 3 results) No results for input(s): PROBNP in the last 8760 hours. HbA1C: No results for input(s): HGBA1C in the last 72 hours. CBG: Recent Labs  Lab 09/04/17 1345 09/04/17 2103  GLUCAP 120* 114*    Lipid Profile: No results for input(s): CHOL, HDL, LDLCALC, TRIG, CHOLHDL, LDLDIRECT in the last 72 hours. Thyroid Function Tests: No results for input(s): TSH, T4TOTAL, FREET4, T3FREE, THYROIDAB in the last 72 hours. Anemia Panel: No results for input(s): VITAMINB12, FOLATE, FERRITIN, TIBC, IRON, RETICCTPCT in the last 72 hours. Urine analysis:    Component Value Date/Time   COLORURINE YELLOW 09/04/2017 [place] 09/04/2017 1725   LABSPEC 1.016 09/04/2017 1725   PHURINE 5.0 09/04/2017 1725   GLUCOSEU NEGATIVE 09/04/2017 1725   HGBUR NEGATIVE 09/04/2017 [place] 09/04/2017 [place] 09/04/2017 [place] 09/04/2017 1725   NITRITE  NEGATIVE 09/04/2017 1725   LEUKOCYTESUR NEGATIVE 09/04/2017 1725   Sepsis Labs: @LABRCNTIP (procalcitonin:4,lacticidven:4)  ) Recent Results (from the past 240 hour(s))  Culture, blood (Routine x 2)     Status: Abnormal   Collection Time: 09/04/17  1:15 PM  Result Value Ref Range Status   Specimen Description BLOOD LEFT FOREARM  Final   Special Requests   Final    BOTTLES DRAWN AEROBIC AND ANAEROBIC Blood Culture adequate volume   Culture  Setup Time   Final    GRAM POSITIVE COCCI AEROBIC BOTTLE ONLY CRITICAL VALUE NOTED.  VALUE IS CONSISTENT WITH PREVIOUSLY REPORTED AND CALLED VALUE.    Culture (A)  Final    STAPHYLOCOCCUS AUREUS SUSCEPTIBILITIES PERFORMED ON PREVIOUS CULTURE WITHIN THE LAST 5 DAYS.    Report Status 09/10/2017 FINAL  Final  Culture, blood (Routine x 2)     Status: Abnormal   Collection Time: 09/04/17  1:23 PM  Result Value Ref Range Status   Specimen Description BLOOD ARM RIGHT  Final   Special Requests IN PEDIATRIC BOTTLE Blood Culture adequate volume  Final   Culture  Setup Time   Final    GRAM POSITIVE COCCI IN PEDIATRIC BOTTLE CRITICAL RESULT CALLED TO, READ BACK BY AND VERIFIED WITH: G ABBOTT PHARMD 0149 09/06/17 A BROWNING    Culture METHICILLIN  RESISTANT STAPHYLOCOCCUS AUREUS (A)  Final   Report Status 09/08/2017 FINAL  Final   Organism ID, Bacteria METHICILLIN RESISTANT STAPHYLOCOCCUS AUREUS  Final      Susceptibility   Methicillin resistant staphylococcus aureus - MIC*    CIPROFLOXACIN >=8 RESISTANT Resistant     ERYTHROMYCIN <=0.25 SENSITIVE Sensitive     GENTAMICIN <=0.5 SENSITIVE Sensitive     OXACILLIN RESISTANT Resistant     TETRACYCLINE <=1 SENSITIVE Sensitive     VANCOMYCIN 1 SENSITIVE Sensitive     TRIMETH/SULFA <=10 SENSITIVE Sensitive     CLINDAMYCIN <=0.25 SENSITIVE Sensitive     RIFAMPIN <=0.5 SENSITIVE Sensitive     Inducible Clindamycin NEGATIVE Sensitive     * METHICILLIN RESISTANT STAPHYLOCOCCUS AUREUS  Blood Culture ID Panel (Reflexed)     Status: Abnormal   Collection Time: 09/04/17  1:23 PM  Result Value Ref Range Status   Enterococcus species NOT DETECTED NOT DETECTED Final   Listeria monocytogenes NOT DETECTED NOT DETECTED Final   Staphylococcus species DETECTED (A) NOT DETECTED Final    Comment: CRITICAL RESULT CALLED TO, READ BACK BY AND VERIFIED WITH: G ABBOTT PHARMD 0149 09/06/17 A BROWNING    Staphylococcus aureus DETECTED (A) NOT DETECTED Final    Comment: Methicillin (oxacillin)-resistant Staphylococcus aureus (MRSA). MRSA is predictably resistant to beta-lactam antibiotics (except ceftaroline). Preferred therapy is vancomycin unless clinically contraindicated. Patient requires contact precautions if  hospitalized. CRITICAL RESULT CALLED TO, READ BACK BY AND VERIFIED WITH: G ABBOTT PHARMD 0149 09/06/17 A BROWNING    Methicillin resistance DETECTED (A) NOT DETECTED Final    Comment: CRITICAL RESULT CALLED TO, READ BACK BY AND VERIFIED WITH: G ABBOTT PHARMD 0149 09/06/17 A BROWNING    Streptococcus species NOT DETECTED NOT DETECTED Final   Streptococcus agalactiae NOT DETECTED NOT DETECTED Final   Streptococcus pneumoniae NOT DETECTED NOT DETECTED Final   Streptococcus pyogenes NOT DETECTED NOT  DETECTED Final   Acinetobacter baumannii NOT DETECTED NOT DETECTED Final   Enterobacteriaceae species NOT DETECTED NOT DETECTED Final   Enterobacter cloacae complex NOT DETECTED NOT DETECTED Final   Escherichia coli NOT DETECTED NOT DETECTED Final   Klebsiella oxytoca NOT DETECTED NOT  DETECTED Final   Klebsiella pneumoniae NOT DETECTED NOT DETECTED Final   Proteus species NOT DETECTED NOT DETECTED Final   Serratia marcescens NOT DETECTED NOT DETECTED Final   Haemophilus influenzae NOT DETECTED NOT DETECTED Final   Neisseria meningitidis NOT DETECTED NOT DETECTED Final   Pseudomonas aeruginosa NOT DETECTED NOT DETECTED Final   Candida albicans NOT DETECTED NOT DETECTED Final   Candida glabrata NOT DETECTED NOT DETECTED Final   Candida krusei NOT DETECTED NOT DETECTED Final   Candida parapsilosis NOT DETECTED NOT DETECTED Final   Candida tropicalis NOT DETECTED NOT DETECTED Final  Urine culture     Status: None   Collection Time: 09/04/17  5:42 PM  Result Value Ref Range Status   Specimen Description URINE, RANDOM  Final   Special Requests NONE  Final   Culture NO GROWTH  Final   Report Status 09/05/2017 FINAL  Final  MRSA PCR Screening     Status: None   Collection Time: 09/04/17 10:11 PM  Result Value Ref Range Status   MRSA by PCR NEGATIVE NEGATIVE Final    Comment:        The GeneXpert MRSA Assay (FDA approved for NASAL specimens only), is one component of a comprehensive MRSA colonization surveillance program. It is not intended to diagnose MRSA infection nor to guide or monitor treatment for MRSA infections.   Culture, blood (routine x 2)     Status: None   Collection Time: 09/06/17  8:12 AM  Result Value Ref Range Status   Specimen Description BLOOD RIGHT FOREARM  Final   Special Requests   Final    BOTTLES DRAWN AEROBIC ONLY Blood Culture adequate volume   Culture NO GROWTH 5 DAYS  Final   Report Status 09/11/2017 FINAL  Final  Culture, blood (routine x 2)      Status: Abnormal   Collection Time: 09/06/17  8:16 AM  Result Value Ref Range Status   Specimen Description BLOOD RIGHT HAND  Final   Special Requests IN PEDIATRIC BOTTLE Blood Culture adequate volume  Final   Culture  Setup Time   Final    GRAM POSITIVE COCCI IN PEDIATRIC BOTTLE CRITICAL RESULT CALLED TO, READ BACK BY AND VERIFIED WITH: G ABBOTT PHARMD 0023 09/07/17 A BROWNING    Culture (A)  Final    STAPHYLOCOCCUS AUREUS SUSCEPTIBILITIES PERFORMED ON PREVIOUS CULTURE WITHIN THE LAST 5 DAYS.    Report Status 09/08/2017 FINAL  Final  Blood Culture ID Panel (Reflexed)     Status: Abnormal   Collection Time: 09/06/17  8:16 AM  Result Value Ref Range Status   Enterococcus species NOT DETECTED NOT DETECTED Final   Listeria monocytogenes NOT DETECTED NOT DETECTED Final   Staphylococcus species DETECTED (A) NOT DETECTED Final    Comment: CRITICAL RESULT CALLED TO, READ BACK BY AND VERIFIED WITH: G ABBOTT PHARMD 0023 09/07/17 A BROWNING    Staphylococcus aureus DETECTED (A) NOT DETECTED Final    Comment: Methicillin (oxacillin)-resistant Staphylococcus aureus (MRSA). MRSA is predictably resistant to beta-lactam antibiotics (except ceftaroline). Preferred therapy is vancomycin unless clinically contraindicated. Patient requires contact precautions if  hospitalized. CRITICAL RESULT CALLED TO, READ BACK BY AND VERIFIED WITH: G ABBOTT PHARMD 0023 09/07/17 A BROWNING    Methicillin resistance DETECTED (A) NOT DETECTED Final    Comment: CRITICAL RESULT CALLED TO, READ BACK BY AND VERIFIED WITH: G ABBOTT PHARMD 0023 09/07/17 A BROWNING    Streptococcus species NOT DETECTED NOT DETECTED Final   Streptococcus agalactiae NOT DETECTED NOT  DETECTED Final   Streptococcus pneumoniae NOT DETECTED NOT DETECTED Final   Streptococcus pyogenes NOT DETECTED NOT DETECTED Final   Acinetobacter baumannii NOT DETECTED NOT DETECTED Final   Enterobacteriaceae species NOT DETECTED NOT DETECTED Final    Enterobacter cloacae complex NOT DETECTED NOT DETECTED Final   Escherichia coli NOT DETECTED NOT DETECTED Final   Klebsiella oxytoca NOT DETECTED NOT DETECTED Final   Klebsiella pneumoniae NOT DETECTED NOT DETECTED Final   Proteus species NOT DETECTED NOT DETECTED Final   Serratia marcescens NOT DETECTED NOT DETECTED Final   Haemophilus influenzae NOT DETECTED NOT DETECTED Final   Neisseria meningitidis NOT DETECTED NOT DETECTED Final   Pseudomonas aeruginosa NOT DETECTED NOT DETECTED Final   Candida albicans NOT DETECTED NOT DETECTED Final   Candida glabrata NOT DETECTED NOT DETECTED Final   Candida krusei NOT DETECTED NOT DETECTED Final   Candida parapsilosis NOT DETECTED NOT DETECTED Final   Candida tropicalis NOT DETECTED NOT DETECTED Final  Culture, blood (routine x 2)     Status: Abnormal (Preliminary result)   Collection Time: 09/08/17  9:20 AM  Result Value Ref Range Status   Specimen Description BLOOD RIGHT HAND  Final   Special Requests IN PEDIATRIC BOTTLE Blood Culture adequate volume  Final   Culture  Setup Time   Final    GRAM POSITIVE COCCI IN CLUSTERS IN PEDIATRIC BOTTLE CRITICAL RESULT CALLED TO, READ BACK BY AND VERIFIED WITH: PHARMD Karlene Einstein [id number] MLM    Culture (A)  Final    STAPHYLOCOCCUS AUREUS SUSCEPTIBILITIES TO FOLLOW    Report Status PENDING  Incomplete  Culture, blood (routine x 2)     Status: None (Preliminary result)   Collection Time: 09/08/17  9:28 AM  Result Value Ref Range Status   Specimen Description BLOOD LEFT HAND  Final   Special Requests IN PEDIATRIC BOTTLE Blood Culture adequate volume  Final   Culture NO GROWTH 3 DAYS  Final   Report Status PENDING  Incomplete  Culture, blood (routine x 2)     Status: None (Preliminary result)   Collection Time: 09/09/17 11:35 AM  Result Value Ref Range Status   Specimen Description BLOOD LEFT ARM  Final   Special Requests IN PEDIATRIC BOTTLE Blood Culture adequate volume  Final   Culture NO  GROWTH 2 DAYS  Final   Report Status PENDING  Incomplete  Culture, blood (routine x 2)     Status: None (Preliminary result)   Collection Time: 09/09/17 11:40 AM  Result Value Ref Range Status   Specimen Description BLOOD LEFT HAND  Final   Special Requests IN PEDIATRIC BOTTLE Blood Culture adequate volume  Final   Culture NO GROWTH 2 DAYS  Final   Report Status PENDING  Incomplete      Radiology Studies: No results found.   Scheduled Meds: . chlorhexidine  15 mL Mouth Rinse BID  . cholecalciferol  2,000 Units Oral Daily  . collagenase   Topical Daily  . enoxaparin (LOVENOX) injection  30 mg Subcutaneous Q24H  . feeding supplement (ENSURE ENLIVE)  237 mL Oral BID BM  . feeding supplement (PRO-STAT SUGAR FREE 64)  30 mL Oral BID  . mouth rinse  15 mL Mouth Rinse q12n4p  . multivitamin with minerals  1 tablet Oral Daily  . senna-docusate  2 tablet Oral BID  . cyanocobalamin  1,000 mcg Oral Daily   Continuous Infusions: . DAPTOmycin (CUBICIN)  IV Stopped (09/10/17 1441)  . dextrose 5 % with KCl 20  mEq / L 20 mEq (09/10/17 2056)     LOS: 7 days   Time Spent in minutes   45 minutes  Kendric Sindelar D.O. on 09/11/2017 at 1:21 PM  Between 7am to 7pm - Pager - [phone]  After 7pm go to [email or web address] - password [email or web address]  And look for the night coverage person covering for me after hours  [facility] Office  [phone]

## 2017-09-12 DIAGNOSIS — L97919 Non-pressure chronic ulcer of unspecified part of right lower leg with unspecified severity: Secondary | ICD-10-CM

## 2017-09-12 DIAGNOSIS — L97929 Non-pressure chronic ulcer of unspecified part of left lower leg with unspecified severity: Secondary | ICD-10-CM

## 2017-09-12 LAB — BASIC METABOLIC PANEL
Anion gap: 7 (ref 5–15)
BUN: 34 mg/dL — AB (ref 6–20)
CALCIUM: 8.7 mg/dL — AB (ref 8.9–10.3)
CO2: 24 mmol/L (ref 22–32)
CREATININE: 1.43 mg/dL — AB (ref 0.61–1.24)
Chloride: 110 mmol/L (ref 101–111)
GFR calc Af Amer: 46 mL/min — ABNORMAL LOW (ref >60)
GFR, EST NON AFRICAN AMERICAN: 40 mL/min — AB (ref >60)
Glucose, Bld: 104 mg/dL — ABNORMAL HIGH (ref 65–99)
Potassium: 4 mmol/L (ref 3.5–5.1)
Sodium: 141 mmol/L (ref 135–145)

## 2017-09-12 LAB — CBC
HCT: 27.6 % — ABNORMAL LOW (ref 39.0–52.0)
Hemoglobin: 8.6 g/dL — ABNORMAL LOW (ref 13.0–17.0)
MCH: 29.3 pg (ref 26.0–34.0)
MCHC: 31.2 g/dL (ref 30.0–36.0)
MCV: 93.9 fL (ref 78.0–100.0)
PLATELETS: 338 10*3/uL (ref 150–400)
RBC: 2.94 MIL/uL — ABNORMAL LOW (ref 4.22–5.81)
RDW: 15.4 % (ref 11.5–15.5)
WBC: 7.6 10*3/uL (ref 4.0–10.5)

## 2017-09-12 LAB — CULTURE, BLOOD (ROUTINE X 2): SPECIAL REQUESTS: ADEQUATE

## 2017-09-12 NOTE — Progress Notes (Signed)
    [facility]    Date of Admission:  09/04/2017     ID: Garrett Snow is a 84 y.o. male with  past medical history significant for HTN, colon cancer, recent right femoral neck fracture s/p hip arthroplasty 12/26, hx of AAA who presented on 1/16 from SNF with AMS and increased work of breathing. Found to have MRSA bacteremia, unclear source. Possibly 2/2 to infected hip arthroplasty vs. Lower extremity pressure ulcers. TTE was negative. Given age and risk factors, TEE was not performed.   Antibiotics: 1/16 IV vancomycin >> dc'd 1/18 1/16 IV cefepime >> dc'd 1/18 1/18 IV Daptomycin >>  Cultures:  1/16 Blood cultures >> MRSA 1/18 Blood cultures >> MRSA 1/20 Blood cultures >> MRSA 1/21 Blood cultures >> NG x 3 days  Subjective: Sleeping comfortably.   Medications:  . chlorhexidine  15 mL Mouth Rinse BID  . cholecalciferol  2,000 Units Oral Daily  . collagenase   Topical Daily  . enoxaparin (LOVENOX) injection  30 mg Subcutaneous Q24H  . feeding supplement (ENSURE ENLIVE)  237 mL Oral BID BM  . feeding supplement (PRO-STAT SUGAR FREE 64)  30 mL Oral BID  . mouth rinse  15 mL Mouth Rinse q12n4p  . multivitamin with minerals  1 tablet Oral Daily  . senna-docusate  2 tablet Oral BID  . cyanocobalamin  1,000 mcg Oral Daily    Objective: Vital signs in last 24 hours: Temp:  [97.8 F (36.6 C)-98.4 F (36.9 C)] 97.8 F (36.6 C) (01/24 0525) Pulse Rate:  [58-79] 58 (01/24 0525) Resp:  [16-20] 20 (01/24 0525) BP: (94-118)/(46-94) 94/46 (01/24 0525) SpO2:  [93 %-98 %] 93 % (01/24 0525) Weight:  [167 lb 1.7 oz (75.8 kg)] 167 lb 1.7 oz (75.8 kg) (01/24 0525)  Constitutional: NAD, appears comfortable HEENT: Atraumatic, normocephalic. PERRL, anicteric sclera.  Neck: Supple, trachea midline.  Cardiovascular:RRR Pulmonary/Chest:CTAB anteriorly  Abdominal:Soft, non tender, non distended. +BS.  Extremities: Warm and well perfused. Distal pulses intact. No  edema. LE well wrapped.  Neurological:Alert to person, not place. CN II - XII grossly intact.  Skin: No rashes or erythema  Psychiatric:Normal mood and affect  Lab Results Recent Labs    09/11/17 0651 09/12/17 0429  WBC 8.3 7.6  HGB 8.7* 8.6*  HCT 28.3* 27.6*  NA 141 141  K 3.9 4.0  CL 109 110  CO2 24 24  BUN 37* 34*  CREATININE 1.53* 1.43*   Liver Panel Recent Labs    09/10/17 0456 09/11/17 0651  PROT 4.6* 4.7*  ALBUMIN 1.6* 1.6*  AST 32 37  ALT 41 38  ALKPHOS 145* 134*  BILITOT 0.5 0.6   Assessment/Plan: PICC placement today then Ok to discharge back to SNF from ID standpoint. Plan for 6 weeks of IV daptomycin from start date 1/21.   [facility] Cell: [phone] Pager: [phone]  09/12/2017, 8:51 AM

## 2017-09-12 NOTE — Care Management Note (Addendum)
Case Management Note  Patient Details  Name: Garrett Snow MRN: [id number] Date of Birth: Mar 17, 1919  Subjective/Objective:         Sepsis/MRSA bacteremia.           Garrett Snow (Son)     [phone]       PCP: Letha Cape  Action/Plan: SNF vs [facility] vs home with hospice care. Son, Garrett Snow, states family is leaning toward taking dad home instead of return to SNF. States possibly interested in home hospice care and have spoken with the liaison, Bambi, with [employer] [facility]. Garrett Snow states he also would like for dad to complete the 6 week IV ABX therapy as well. CM made Phil aware completing IV ABX therapy under hospice care might not be available to dad but CM will f/u with an answer. CM made contact with Bambi and asked will pt be able to continue IV ABX therapy with hospice care,and Bambi stated no and wiill it be in contact with Garrett Snow, pt's son with explanation.  Expected Discharge Date:                  Expected Discharge Plan:  (from [facility])  In-House Referral:  Clinical Social Work  Discharge planning Services  CM Consult  Post Acute Care Choice:    Choice offered to:  Adult Children  DME Arranged:    DME Agency:     HH Arranged:    HH Agency:     Status of Service:  In process, will continue to follow  If discussed at Long Length of Stay Meetings, dates discussed:    Additional Comments:  Sharin Mons, RN 09/12/2017, 10:54 AM

## 2017-09-12 NOTE — Progress Notes (Signed)
Wound care was done per MD order. Patient tolerated well. Will continue to monitor.

## 2017-09-12 NOTE — Progress Notes (Signed)
  Speech Language Pathology Treatment: Dysphagia  Patient Details Name: Garrett Snow MRN: [id number] DOB: Dec 29, 1918 Today's Date: 09/12/2017 Time: 0920-0930 SLP Time Calculation (min) (ACUTE ONLY): 10 min  Assessment / Plan / Recommendation Clinical Impression  Pt continues to have reduced appetite and lethargy, limiting participation in therapy. Pt refused all solid trials, but did take X2 sips of thin liquid w/o overt signs/symptoms of aspiration. Pt noted to have residue in oral cavity from breakfast upon arrival to room. Mod verbal cues were required for pt to open mouth and swish liquid to clear cavity. Mouth swab was also used to ensure all residue was cleared. Recommend continued Dysphagia 3 and thin liquid diet, with full supervision during meals to ensure residue clearance and encourage oral intake. Given pt tolerance of diet, stable vitals/clinical picture, and return to baseline diet, speech therapy services no longer indicated. Please re-consult in the event of any acute changes.    HPI HPI: Garrett P Smootis a 84 y.o.malewith medical history significant ofHTN, DJD, dementia, Recent Right Femoral Neck Fx s/p Hip Arthroplasty 12/26, Hx of AAA who presented to [name] from SNF with AMS andincreased work of breathing. Initial CXR showed PNA and patient was started on Augmentin at SNF. Admitted for sepsis and AMS.CXR 1/16 aortic atherosclerosis. No acute cardiopulmonary abnormality seen. Head CT subacute to chronic right convexity subdural hematoma, measuring up to 10 mm in thickness without associated midline shift or other mass effect.       SLP Plan  Discharge SLP treatment due to (comment)(pt safely at baseline diet )       Recommendations  Diet recommendations: Dysphagia 3 (mechanical soft);Thin liquid Liquids provided via: Straw;Cup Medication Administration: Whole meds with puree Supervision: Staff to assist with self feeding Compensations: Slow rate;Small  sips/bites Postural Changes and/or Swallow Maneuvers: Seated upright 90 degrees                Oral Care Recommendations: Oral care BID Follow up Recommendations: None SLP Visit Diagnosis: Dysphagia, oral phase (R13.11) Plan: Discharge SLP treatment due to (comment)(pt safely at baseline diet )       GO              [place] Shooter Tangen SLP Student Clinician  [place] Leyton Brownlee 09/12/2017, 10:57 AM

## 2017-09-12 NOTE — Progress Notes (Signed)
PROGRESS NOTE    Garrett Snow  VQM:[id number] DOB: Jan 05, 1919 DOA: 09/04/2017 PCP: [name], MD   Chief Complaint  Patient presents with  . Altered Mental Status    Brief Narrative:  Garrett P Smootis a 84 y.o.malewith medical history significant ofHTN, DJD, Recent Right Femoral Neck Fx s/p Hip Arthroplasty 12/26, Hx of AAA, and other comorbids who presented to [name] from SNF with AMS andincreased work of breathing. Per family patient at baseline has some dementia but is able to carry on a conversation to an extent. Patient's family noticed a decline yesterday morning and noticed that he was more confused than baseline. Work up performed at [facility] included a CXR which showed PNA and patient was started on Augmentin. Because of worsening confusion and Hypotension, he was sent to the ED for evaluation. Upon my evaluation patient is confused but is able to answer some questions. Denies any Pain. TRH was asked to admit for Sepsis and AMS.Patient's Blood Cx grew 1/2 MRSA so will repeat Blood Cx and likely narrow just to Vancomycin and Infectious Diseases was automatically consulted because of positive BCID. ID Changed Abx to Vancomycin. Blood Cultures remained persistently positive but finally showed NGTD <24 hours from Cx obtained 09/09/17; If remains Negative at 3 days will place PICC Line and D/C Back to SNF as patient is other wise medically stable.   Assessment & Plan   Sepsis secondary to MRSA bacteremia -Patient was hypotensive, tachypneic with leukocytosis on admission -Patient was diagnosed with pneumonia [facility] facility in [place] -Patient was placed on IV fluids -Blood cultures on 09/06/2017, 09/08/2017 were positive for MRSA -Repeat blood cultures on 09/09/2017 show no growth to date. -Currently unclear source of bacteremia -Echocardiogram did not mention any vegetations, mildly thickened leaflets. Given age, TEE would not be pursued at this time. -Right hip x-ray showed  replacement in good position. No acute fractures or dislocations. -Patient does have lower extremity ulcers which may be source of infection -Infectious disease consulted and appreciated -PICC line to be ordered today by ID. 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, who did not recommend intervention at this time and did not feel patient's sudural hygroma was the cause of patient's encephalopathy -hypernatremia improved -continue treatment of infection  Hypotension with history of hypertension -Amlodipine, Proscar held  -BP appears to be improving  Acute kidney injury and chronic kidney stage III -Resolved -Baseline creatinine approximately 1.4-1.5, on admission creatinine 2.8 -Creatinine currently 1.43 -Renal ultrasound showed no hydronephrosis. -Continue to monitor BMP  Acute urinary retention  -Had 646 mL noted on bladder scan and required only catheter placement. 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 by previous hospitalist recommended IR for other modalities and question of IVC filter -Previous as was discussed with [name] [place] who recommended no IVC filter because occlusion above renal vein. No appropriate imaging can be done as contrast cannot be used to to kidney function.  Cannot exclude tumor.  Recent right femoral neck  fracture -Status post heme arthroplasty by [name] on 08/14/2017 -Right hip x-ray showed right hip replacement in good position with no acute fractures or dislocations. -Continue DVT prophylaxis  Bilateral lower extremity wounds/ulcers -Continue wound care  Normocytic anemia -Hemoglobin appears to be stable, currently 8.6 -continue monitor CBC  History of AAA -Continue to monitor as an outpatient  Hypernatremia/hyperchloremia -Resolved, continue to monitor BMP  Hypokalemia -Resolved, continue monitor BMP  Hypophosphatemia -Resolved, continue to monitor  Elevated troponin -Likely demand ischemia in the setting of sepsis -Troponin trending downward  Elevated AST -Resolved, continue to monitor CMP  Severe malnutrition a context of chronic illness -Nutrition consult and appreciated, continue supplementation  DVT Prophylaxis  SCDs  Code Status: DNR  Family Communication: None at bedside  Disposition Plan: Admitted. Likely discharge to SNF on 09/13/2017  Consultants Vascular surgery, [name], via phone Interventional radiology, [name], via hpone Infectious disease Orthopedic surgery  Procedures  Echocardiogram Lower extremity doppler  Antibiotics   Anti-infectives (From admission, onward)   Start     Dose/Rate Route Frequency Ordered Stop   09/10/17 1400  DAPTOmycin (CUBICIN) 764 mg in sodium chloride 0.9 % IVPB  Status:  Discontinued     10 mg/kg  76.4 kg 224.4 mL/hr over 30 Minutes Intravenous Every 48 hours 09/09/17 1129 09/09/17 1406   09/10/17 1400  DAPTOmycin (CUBICIN) 750 mg in sodium chloride 0.9 % IVPB     750 mg 224.4 mL/hr over 30 Minutes Intravenous Every 48 hours 09/09/17 1406     09/06/17 1600  vancomycin (VANCOCIN) IVPB 1000 mg/200 mL premix  Status:  Discontinued     1,000 mg 200 mL/hr over 60 Minutes Intravenous Every 48 hours 09/04/17 1528 09/06/17 1101   09/06/17 1400  vancomycin (VANCOCIN) IVPB 750 mg/150 ml premix  Status:   Discontinued     750 mg 150 mL/hr over 60 Minutes Intravenous Every 24 hours 09/06/17 1101 09/06/17 1149   09/06/17 1400  DAPTOmycin (CUBICIN) 611 mg in sodium chloride 0.9 % IVPB  Status:  Discontinued     8 mg/kg  76.4 kg 224.4 mL/hr over 30 Minutes Intravenous Every 48 hours 09/06/17 1149 09/09/17 1129   09/05/17 1600  ceFEPIme (MAXIPIME) 1 g in dextrose 5 % 50 mL IVPB  Status:  Discontinued     1 g 100 mL/hr over 30 Minutes Intravenous Every 24 hours 09/04/17 1528 09/06/17 1003   09/04/17 1530  vancomycin (VANCOCIN) 1,500 mg in sodium chloride 0.9 % 500 mL IVPB     1,500 mg 250 mL/hr over 120 Minutes Intravenous STAT 09/04/17 1528 09/04/17 1911   09/04/17 1530  ceFEPIme (MAXIPIME) 2 g in dextrose 5 % 50 mL IVPB     2 g 100 mL/hr over 30 Minutes Intravenous STAT 09/04/17 1528 09/04/17 1713      Subjective:   Julie Kiddy seen and examined today.  No complaints today. States he is feeling ok and having breakfast. Denies chest pain or shortness of breath.  Objective:   Vitals:   09/11/17 0603 09/11/17 1526 09/11/17 2146 09/12/17 0525  BP: (!) 106/51 (!) 104/53 (!) 118/94 (!) 94/46  Pulse: 65 68 79 (!) 58  Resp: 18 16 18 20   Temp: 97.7 F (36.5 C) 98.3 F (36.8 C) 98.4 F (36.9 C) 97.8 F (36.6 C)  TempSrc: Oral Oral Oral Oral  SpO2: 94% 98% 93% 93%  Weight:    75.8 kg (167 lb 1.7 oz)  Height:        Intake/Output Summary (Last 24 hours) at 09/12/2017 1208 Last data filed at 09/12/2017 1031 Gross per 24 hour  Intake 815.83 ml  Output 525 ml  Net 290.83 ml   Filed Weights   09/10/17 0513 09/11/17 0121 09/12/17 0525  Weight: 75 kg (165 lb 5.5 oz) 76.6 kg (168 lb 14 oz) 75.8 kg (167 lb 1.7 oz)   Exam  General: Well developed, elderly, NAD  HEENT: NCAT, mucous membranes moist.   Cardiovascular: S1 S2 auscultated, RRR, no rubs  Respiratory: Clear to auscultation bilaterally with equal chest rise  Abdomen: Soft, nontender, nondistended, + bowel  sounds  Extremities: warm dry without cyanosis clubbing or edema  Neuro: AAOx3, nonfocal  Psych: Appropriate mood and affect  Data Reviewed: I have personally reviewed following labs and imaging studies  CBC: Recent Labs  Lab 09/07/17 0947 09/08/17 0355 09/09/17 0340 09/10/17 0456 09/11/17 0651 09/12/17 0429  WBC 9.7 8.4 10.4 7.5 8.3 7.6  NEUTROABS 7.0 6.2 7.2 5.3 5.9  --   HGB 9.6* 9.5* 10.2* 9.1* 8.7* 8.6*  HCT 30.6* 30.2* 31.2* 29.4* 28.3* 27.6*  MCV 93.0 94.1 93.7 94.5 93.7 93.9  PLT 187 223 259 286 331 193   Basic Metabolic Panel: Recent Labs  Lab 09/07/17 0947 09/08/17 0355 09/09/17 0340 09/10/17 0456 09/11/17 0651 09/12/17 0429  NA 145 146* 146* 143 141 141  K 4.0 3.9 4.0 4.0 3.9 4.0  CL 115* 114* 114* 111 109 110  CO2 20* 23 22 24 24 24   GLUCOSE 118* 113* 114* 112* 107* 104*  BUN 34* 34* 38* 39* 37* 34*  CREATININE 1.40* 1.37* 1.45* 1.48* 1.53* 1.43*  CALCIUM 8.7* 8.9 8.9 9.0 8.9 8.7*  MG 2.1 2.0 2.1 2.2 2.1  --   PHOS 2.3* 2.6 2.6 3.2 3.1  --    GFR: Estimated Creatinine Clearance: 32.4 mL/min (A) (by C-G formula based on SCr of 1.43 mg/dL (H)). Liver Function Tests: Recent Labs  Lab 09/07/17 0947 09/08/17 0355 09/09/17 0340 09/10/17 0456 09/11/17 0651  AST 64* 54* 76* 32 37  ALT 43 46 62 41 38  ALKPHOS 156* 178* 189* 145* 134*  BILITOT 0.9 0.8 0.7 0.5 0.6  PROT 4.7* 4.6* 5.0* 4.6* 4.7*  ALBUMIN 1.8* 1.7* 1.8* 1.6* 1.6*   No results for input(s): LIPASE, AMYLASE in the last 168 hours. No results for input(s): AMMONIA in the last 168 hours. Coagulation Profile: No results for input(s): INR, PROTIME in the last 168 hours. Cardiac Enzymes: Recent Labs  Lab 09/06/17 0357  CKTOTAL 34*   BNP (last 3 results) No results for input(s): PROBNP in the last 8760 hours. HbA1C: No results for input(s): HGBA1C in the last 72 hours. CBG: No results for input(s): GLUCAP in the last 168 hours. Lipid Profile: No results for input(s): CHOL, HDL,  LDLCALC, TRIG, CHOLHDL, LDLDIRECT in the last 72 hours. Thyroid Function Tests: No results for input(s): TSH, T4TOTAL, FREET4, T3FREE, THYROIDAB in the last 72 hours. Anemia Panel: No results for input(s): VITAMINB12, FOLATE, FERRITIN, TIBC, IRON, RETICCTPCT in the last 72 hours. Urine analysis:    Component Value Date/Time   COLORURINE YELLOW 09/04/2017 [facility] 09/04/2017 1725   LABSPEC 1.016 09/04/2017 1725   PHURINE 5.0 09/04/2017 1725   GLUCOSEU NEGATIVE 09/04/2017 1725   HGBUR NEGATIVE 09/04/2017 [place] 09/04/2017 [place] 09/04/2017 [place] 09/04/2017 1725   NITRITE NEGATIVE 09/04/2017 1725  LEUKOCYTESUR NEGATIVE 09/04/2017 1725   Sepsis Labs: @LABRCNTIP (procalcitonin:4,lacticidven:4)  ) Recent Results (from the past 240 hour(s))  Culture, blood (Routine x 2)     Status: Abnormal   Collection Time: 09/04/17  1:15 PM  Result Value Ref Range Status   Specimen Description BLOOD LEFT FOREARM  Final   Special Requests   Final    BOTTLES DRAWN AEROBIC AND ANAEROBIC Blood Culture adequate volume   Culture  Setup Time   Final    GRAM POSITIVE COCCI AEROBIC BOTTLE ONLY CRITICAL VALUE NOTED.  VALUE IS CONSISTENT WITH PREVIOUSLY REPORTED AND CALLED VALUE.    Culture (A)  Final    STAPHYLOCOCCUS AUREUS SUSCEPTIBILITIES PERFORMED ON PREVIOUS CULTURE WITHIN THE LAST 5 DAYS.    Report Status 09/10/2017 FINAL  Final  Culture, blood (Routine x 2)     Status: Abnormal   Collection Time: 09/04/17  1:23 PM  Result Value Ref Range Status   Specimen Description BLOOD ARM RIGHT  Final   Special Requests IN PEDIATRIC BOTTLE Blood Culture adequate volume  Final   Culture  Setup Time   Final    GRAM POSITIVE COCCI IN PEDIATRIC BOTTLE CRITICAL RESULT CALLED TO, READ BACK BY AND VERIFIED WITH: G ABBOTT PHARMD 0149 09/06/17 A BROWNING    Culture METHICILLIN RESISTANT STAPHYLOCOCCUS AUREUS (A)  Final   Report Status  09/08/2017 FINAL  Final   Organism ID, Bacteria METHICILLIN RESISTANT STAPHYLOCOCCUS AUREUS  Final      Susceptibility   Methicillin resistant staphylococcus aureus - MIC*    CIPROFLOXACIN >=8 RESISTANT Resistant     ERYTHROMYCIN <=0.25 SENSITIVE Sensitive     GENTAMICIN <=0.5 SENSITIVE Sensitive     OXACILLIN RESISTANT Resistant     TETRACYCLINE <=1 SENSITIVE Sensitive     VANCOMYCIN 1 SENSITIVE Sensitive     TRIMETH/SULFA <=10 SENSITIVE Sensitive     CLINDAMYCIN <=0.25 SENSITIVE Sensitive     RIFAMPIN <=0.5 SENSITIVE Sensitive     Inducible Clindamycin NEGATIVE Sensitive     * METHICILLIN RESISTANT STAPHYLOCOCCUS AUREUS  Blood Culture ID Panel (Reflexed)     Status: Abnormal   Collection Time: 09/04/17  1:23 PM  Result Value Ref Range Status   Enterococcus species NOT DETECTED NOT DETECTED Final   Listeria monocytogenes NOT DETECTED NOT DETECTED Final   Staphylococcus species DETECTED (A) NOT DETECTED Final    Comment: CRITICAL RESULT CALLED TO, READ BACK BY AND VERIFIED WITH: G ABBOTT PHARMD 0149 09/06/17 A BROWNING    Staphylococcus aureus DETECTED (A) NOT DETECTED Final    Comment: Methicillin (oxacillin)-resistant Staphylococcus aureus (MRSA). MRSA is predictably resistant to beta-lactam antibiotics (except ceftaroline). Preferred therapy is vancomycin unless clinically contraindicated. Patient requires contact precautions if  hospitalized. CRITICAL RESULT CALLED TO, READ BACK BY AND VERIFIED WITH: G ABBOTT PHARMD 0149 09/06/17 A BROWNING    Methicillin resistance DETECTED (A) NOT DETECTED Final    Comment: CRITICAL RESULT CALLED TO, READ BACK BY AND VERIFIED WITH: G ABBOTT PHARMD 0149 09/06/17 A BROWNING    Streptococcus species NOT DETECTED NOT DETECTED Final   Streptococcus agalactiae NOT DETECTED NOT DETECTED Final   Streptococcus pneumoniae NOT DETECTED NOT DETECTED Final   Streptococcus pyogenes NOT DETECTED NOT DETECTED Final   Acinetobacter baumannii NOT DETECTED NOT  DETECTED Final   Enterobacteriaceae species NOT DETECTED NOT DETECTED Final   Enterobacter cloacae complex NOT DETECTED NOT DETECTED Final   Escherichia coli NOT DETECTED NOT DETECTED Final   Klebsiella oxytoca NOT DETECTED NOT DETECTED Final   Klebsiella  pneumoniae NOT DETECTED NOT DETECTED Final   Proteus species NOT DETECTED NOT DETECTED Final   Serratia marcescens NOT DETECTED NOT DETECTED Final   Haemophilus influenzae NOT DETECTED NOT DETECTED Final   Neisseria meningitidis NOT DETECTED NOT DETECTED Final   Pseudomonas aeruginosa NOT DETECTED NOT DETECTED Final   Candida albicans NOT DETECTED NOT DETECTED Final   Candida glabrata NOT DETECTED NOT DETECTED Final   Candida krusei NOT DETECTED NOT DETECTED Final   Candida parapsilosis NOT DETECTED NOT DETECTED Final   Candida tropicalis NOT DETECTED NOT DETECTED Final  Urine culture     Status: None   Collection Time: 09/04/17  5:42 PM  Result Value Ref Range Status   Specimen Description URINE, RANDOM  Final   Special Requests NONE  Final   Culture NO GROWTH  Final   Report Status 09/05/2017 FINAL  Final  MRSA PCR Screening     Status: None   Collection Time: 09/04/17 10:11 PM  Result Value Ref Range Status   MRSA by PCR NEGATIVE NEGATIVE Final    Comment:        The GeneXpert MRSA Assay (FDA approved for NASAL specimens only), is one component of a comprehensive MRSA colonization surveillance program. It is not intended to diagnose MRSA infection nor to guide or monitor treatment for MRSA infections.   Culture, blood (routine x 2)     Status: None   Collection Time: 09/06/17  8:12 AM  Result Value Ref Range Status   Specimen Description BLOOD RIGHT FOREARM  Final   Special Requests   Final    BOTTLES DRAWN AEROBIC ONLY Blood Culture adequate volume   Culture NO GROWTH 5 DAYS  Final   Report Status 09/11/2017 FINAL  Final  Culture, blood (routine x 2)     Status: Abnormal   Collection Time: 09/06/17  8:16 AM   Result Value Ref Range Status   Specimen Description BLOOD RIGHT HAND  Final   Special Requests IN PEDIATRIC BOTTLE Blood Culture adequate volume  Final   Culture  Setup Time   Final    GRAM POSITIVE COCCI IN PEDIATRIC BOTTLE CRITICAL RESULT CALLED TO, READ BACK BY AND VERIFIED WITH: G ABBOTT PHARMD 0023 09/07/17 A BROWNING    Culture (A)  Final    STAPHYLOCOCCUS AUREUS SUSCEPTIBILITIES PERFORMED ON PREVIOUS CULTURE WITHIN THE LAST 5 DAYS.    Report Status 09/08/2017 FINAL  Final  Blood Culture ID Panel (Reflexed)     Status: Abnormal   Collection Time: 09/06/17  8:16 AM  Result Value Ref Range Status   Enterococcus species NOT DETECTED NOT DETECTED Final   Listeria monocytogenes NOT DETECTED NOT DETECTED Final   Staphylococcus species DETECTED (A) NOT DETECTED Final    Comment: CRITICAL RESULT CALLED TO, READ BACK BY AND VERIFIED WITH: G ABBOTT PHARMD 0023 09/07/17 A BROWNING    Staphylococcus aureus DETECTED (A) NOT DETECTED Final    Comment: Methicillin (oxacillin)-resistant Staphylococcus aureus (MRSA). MRSA is predictably resistant to beta-lactam antibiotics (except ceftaroline). Preferred therapy is vancomycin unless clinically contraindicated. Patient requires contact precautions if  hospitalized. CRITICAL RESULT CALLED TO, READ BACK BY AND VERIFIED WITH: G ABBOTT PHARMD 0023 09/07/17 A BROWNING    Methicillin resistance DETECTED (A) NOT DETECTED Final    Comment: CRITICAL RESULT CALLED TO, READ BACK BY AND VERIFIED WITH: G ABBOTT PHARMD 0023 09/07/17 A BROWNING    Streptococcus species NOT DETECTED NOT DETECTED Final   Streptococcus agalactiae NOT DETECTED NOT DETECTED Final   Streptococcus  pneumoniae NOT DETECTED NOT DETECTED Final   Streptococcus pyogenes NOT DETECTED NOT DETECTED Final   Acinetobacter baumannii NOT DETECTED NOT DETECTED Final   Enterobacteriaceae species NOT DETECTED NOT DETECTED Final   Enterobacter cloacae complex NOT DETECTED NOT DETECTED Final    Escherichia coli NOT DETECTED NOT DETECTED Final   Klebsiella oxytoca NOT DETECTED NOT DETECTED Final   Klebsiella pneumoniae NOT DETECTED NOT DETECTED Final   Proteus species NOT DETECTED NOT DETECTED Final   Serratia marcescens NOT DETECTED NOT DETECTED Final   Haemophilus influenzae NOT DETECTED NOT DETECTED Final   Neisseria meningitidis NOT DETECTED NOT DETECTED Final   Pseudomonas aeruginosa NOT DETECTED NOT DETECTED Final   Candida albicans NOT DETECTED NOT DETECTED Final   Candida glabrata NOT DETECTED NOT DETECTED Final   Candida krusei NOT DETECTED NOT DETECTED Final   Candida parapsilosis NOT DETECTED NOT DETECTED Final   Candida tropicalis NOT DETECTED NOT DETECTED Final  Culture, blood (routine x 2)     Status: Abnormal   Collection Time: 09/08/17  9:20 AM  Result Value Ref Range Status   Specimen Description BLOOD RIGHT HAND  Final   Special Requests IN PEDIATRIC BOTTLE Blood Culture adequate volume  Final   Culture  Setup Time   Final    GRAM POSITIVE COCCI IN CLUSTERS IN PEDIATRIC BOTTLE CRITICAL RESULT CALLED TO, READ BACK BY AND VERIFIED WITH: PHARMD Karlene Einstein [id number] MLM    Culture METHICILLIN RESISTANT STAPHYLOCOCCUS AUREUS (A)  Final   Report Status 09/12/2017 FINAL  Final   Organism ID, Bacteria METHICILLIN RESISTANT STAPHYLOCOCCUS AUREUS  Final      Susceptibility   Methicillin resistant staphylococcus aureus - MIC*    CIPROFLOXACIN >=8 RESISTANT Resistant     ERYTHROMYCIN <=0.25 SENSITIVE Sensitive     GENTAMICIN <=0.5 SENSITIVE Sensitive     OXACILLIN RESISTANT Resistant     TETRACYCLINE <=1 SENSITIVE Sensitive     VANCOMYCIN <=0.5 SENSITIVE Sensitive     TRIMETH/SULFA <=10 SENSITIVE Sensitive     CLINDAMYCIN <=0.25 SENSITIVE Sensitive     RIFAMPIN <=0.5 SENSITIVE Sensitive     Inducible Clindamycin NEGATIVE Sensitive     * METHICILLIN RESISTANT STAPHYLOCOCCUS AUREUS  Culture, blood (routine x 2)     Status: None (Preliminary result)    Collection Time: 09/08/17  9:28 AM  Result Value Ref Range Status   Specimen Description BLOOD LEFT HAND  Final   Special Requests IN PEDIATRIC BOTTLE Blood Culture adequate volume  Final   Culture NO GROWTH 4 DAYS  Final   Report Status PENDING  Incomplete  Culture, blood (routine x 2)     Status: None (Preliminary result)   Collection Time: 09/09/17 11:35 AM  Result Value Ref Range Status   Specimen Description BLOOD LEFT ARM  Final   Special Requests IN PEDIATRIC BOTTLE Blood Culture adequate volume  Final   Culture NO GROWTH 3 DAYS  Final   Report Status PENDING  Incomplete  Culture, blood (routine x 2)     Status: None (Preliminary result)   Collection Time: 09/09/17 11:40 AM  Result Value Ref Range Status   Specimen Description BLOOD LEFT HAND  Final   Special Requests IN PEDIATRIC BOTTLE Blood Culture adequate volume  Final   Culture NO GROWTH 3 DAYS  Final   Report Status PENDING  Incomplete      Radiology Studies: No results found.   Scheduled Meds: . chlorhexidine  15 mL Mouth Rinse BID  . cholecalciferol  2,000  Units Oral Daily  . collagenase   Topical Daily  . enoxaparin (LOVENOX) injection  30 mg Subcutaneous Q24H  . feeding supplement (ENSURE ENLIVE)  237 mL Oral BID BM  . feeding supplement (PRO-STAT SUGAR FREE 64)  30 mL Oral BID  . mouth rinse  15 mL Mouth Rinse q12n4p  . multivitamin with minerals  1 tablet Oral Daily  . senna-docusate  2 tablet Oral BID  . cyanocobalamin  1,000 mcg Oral Daily   Continuous Infusions: . DAPTOmycin (CUBICIN)  IV Stopped (09/10/17 1441)  . dextrose 5 % with KCl 20 mEq / L 20 mEq (09/12/17 0752)     LOS: 8 days   Time Spent in minutes   30 minutes  Daliana Leverett D.O. on 09/12/2017 at 12:08 PM  Between 7am to 7pm - Pager - [phone]  After 7pm go to [email or web address] - password [email or web address]  And look for the night coverage person covering for me after hours  [facility] Office  [phone]

## 2017-09-13 LAB — CBC
HCT: 27.6 % — ABNORMAL LOW (ref 39.0–52.0)
HEMOGLOBIN: 8.6 g/dL — AB (ref 13.0–17.0)
MCH: 29.4 pg (ref 26.0–34.0)
MCHC: 31.2 g/dL (ref 30.0–36.0)
MCV: 94.2 fL (ref 78.0–100.0)
Platelets: 369 10*3/uL (ref 150–400)
RBC: 2.93 MIL/uL — ABNORMAL LOW (ref 4.22–5.81)
RDW: 15.5 % (ref 11.5–15.5)
WBC: 7.2 10*3/uL (ref 4.0–10.5)

## 2017-09-13 LAB — BASIC METABOLIC PANEL
ANION GAP: 8 (ref 5–15)
BUN: 34 mg/dL — ABNORMAL HIGH (ref 6–20)
CALCIUM: 8.7 mg/dL — AB (ref 8.9–10.3)
CO2: 21 mmol/L — ABNORMAL LOW (ref 22–32)
Chloride: 108 mmol/L (ref 101–111)
Creatinine, Ser: 1.42 mg/dL — ABNORMAL HIGH (ref 0.61–1.24)
GFR calc non Af Amer: 40 mL/min — ABNORMAL LOW (ref >60)
GFR, EST AFRICAN AMERICAN: 46 mL/min — AB (ref >60)
GLUCOSE: 97 mg/dL (ref 65–99)
Potassium: 4.1 mmol/L (ref 3.5–5.1)
SODIUM: 137 mmol/L (ref 135–145)

## 2017-09-13 LAB — CULTURE, BLOOD (ROUTINE X 2)
CULTURE: NO GROWTH
Special Requests: ADEQUATE

## 2017-09-13 LAB — CK: Total CK: 15 U/L — ABNORMAL LOW (ref 49–397)

## 2017-09-13 NOTE — Progress Notes (Signed)
CSW spoke to patient's son, [name], via phone. Son indicated the plan is now for patient to return home with hospice services, as opposed to continuing treatment at SNF. CSW confirmed with RNCM; RNCM to follow for home hospice needs. CSW signing off, as no additional needs identified. Please re-consult if needs arise during patient's admission.  Garrett Snow, [place]

## 2017-09-13 NOTE — Care Management Note (Addendum)
Case Management Note  Patient Details  Name: Garrett Snow MRN: [id number] Date of Birth: Jul 02, 1919  Subjective/Objective:        Sepsis/MRSA bacteremia            Daryll Spisak (Son)     [phone]       PCP: [place] Rile  Action/Plan: Transition to home with hospice care on 1/27. Pt unable to d/c prior 2/2 recent death in family, 24/7 supervision not available. Pt will need PTAR  called  and arranged for transportation to home on 1/27.   Expected Discharge Date:    09/15/2017          Expected Discharge Plan:  Home with hospice  In-House Referral:  Clinical Social Work  Discharge planning Services  CM Consult  Post Acute Care Choice:    Choice offered to:  Adult Children  DME Arranged:   equipment needs already arranged through hospice agency. DME Agency:     HH Arranged:   home hospice [place], [phone]  Status of Service:  Completed, signed off  If discussed at [place] of Stay Meetings, dates discussed:    Additional Comments:  Sharin Mons, RN 09/13/2017, 1:07 PM

## 2017-09-13 NOTE — Progress Notes (Signed)
PROGRESS NOTE    Garrett Snow  OFB:[id number] DOB: [birth year] DOA: 09/04/2017 PCP: [name], MD   Chief Complaint  Patient presents with  . Altered Mental Status    Brief Narrative:  Garrett Snow a 84 y.o.malewith medical history significant ofHTN, DJD, Recent Right Femoral Neck Fx s/p Hip Arthroplasty 12/26, Hx of AAA, and other comorbids who presented to [name] from SNF with AMS andincreased work of breathing. Per family patient at baseline has some dementia but is able to carry on a conversation to an extent. Patient's family noticed a decline yesterday morning and noticed that he was more confused than baseline. Work up performed at [facility] included a CXR which showed PNA and patient was started on Augmentin. Because of worsening confusion and Hypotension, he was sent to the ED for evaluation. Upon my evaluation patient is confused but is able to answer some questions. Denies any Pain. TRH was asked to admit for Sepsis and AMS.Patient's Blood Cx grew 1/2 MRSA so will repeat Blood Cx and likely narrow just to Vancomycin and Infectious Diseases was automatically consulted because of positive BCID. ID Changed Abx to Vancomycin. Blood Cultures remained persistently positive but finally showed NGTD <24 hours from Cx obtained 09/09/17; If remains Negative at 3 days will place PICC Line and D/C Back to SNF as patient is other wise medically stable.   Assessment & Plan   Sepsis secondary to MRSA bacteremia -Patient was hypotensive, tachypneic with leukocytosis on admission -Patient was diagnosed with pneumonia [facility] facility in [place] -Patient was placed on IV fluids -Blood cultures on 09/06/2017, 09/08/2017 were positive for MRSA -Repeat blood cultures on 09/09/2017 show no growth to date. -Currently unclear source of bacteremia -Echocardiogram did not mention any vegetations, mildly thickened leaflets. Given age, TEE would not be pursued at this time. -Right hip x-ray showed  replacement in good position. No acute fractures or dislocations. 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, who did not recommend intervention at this time and did not feel patient's sudural hygroma was the cause of patient's encephalopathy -hypernatremia improved -continue treatment of infection -mental status appears to be waxing and waning  Hypotension with history of hypertension -Amlodipine, Proscar held  -BP appears to be improving  Acute kidney injury and chronic kidney stage III -Resolved -Baseline creatinine approximately 1.4-1.5, on admission creatinine 2.8 -Creatinine currently 1.42 -Renal ultrasound showed no hydronephrosis.  Acute urinary retention  -Had 646 mL noted on bladder scan and required only catheter placement. 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 by previous hospitalist recommended IR for other modalities and question of IVC filter -Previous as was discussed with [name] [place] who recommended no IVC filter because occlusion above renal vein. No appropriate imaging  can be done as contrast cannot be  used to to kidney function. Cannot exclude tumor.  Recent right femoral neck fracture -Status post heme arthroplasty by [name] on 08/14/2017 -Right hip x-ray showed right hip replacement in good position with no acute fractures or dislocations. -Continue DVT prophylaxis  Bilateral lower extremity wounds/ulcers -Continue wound care  Normocytic anemia -Hemoglobin appears to be stable, currently 8.6  History of AAA -Continue to monitor as an outpatient  Hypernatremia/hyperchloremia -Resolved, continue to monitor BMP  Hypokalemia -Resolved  Hypophosphatemia -Resolved  Elevated troponin -Likely demand ischemia in the setting of sepsis -Troponin trending downward  Elevated AST -Resolved  Severe malnutrition a context of chronic illness -Nutrition consult and appreciated, continue supplementation  DVT Prophylaxis  SCDs  Code Status: DNR  Family Communication: None at bedside. Son via phone  Disposition Plan: Admitted. Likely discharge to home with hospice on 09/15/2017  Consultants Vascular surgery, [name], via phone Interventional radiology, [name], via hpone Infectious disease Orthopedic surgery  Procedures  Echocardiogram Lower extremity doppler  Antibiotics   Anti-infectives (From admission, onward)   Start     Dose/Rate Route Frequency Ordered Stop   09/10/17 1400  DAPTOmycin (CUBICIN) 764 mg in sodium chloride 0.9 % IVPB  Status:  Discontinued     10 mg/kg  76.4 kg 224.4 mL/hr over 30 Minutes Intravenous Every 48 hours 09/09/17 1129 09/09/17 1406   09/10/17 1400  DAPTOmycin (CUBICIN) 750 mg in sodium chloride 0.9 % IVPB     750 mg 224.4 mL/hr over 30 Minutes Intravenous Every 48 hours 09/09/17 1406     09/06/17 1600  vancomycin (VANCOCIN) IVPB 1000 mg/200 mL premix  Status:  Discontinued     1,000 mg 200 mL/hr over 60 Minutes Intravenous Every 48 hours 09/04/17 1528 09/06/17 1101   09/06/17 1400  vancomycin (VANCOCIN) IVPB 750 mg/150 ml  premix  Status:  Discontinued     750 mg 150 mL/hr over 60 Minutes Intravenous Every 24 hours 09/06/17 1101 09/06/17 1149   09/06/17 1400  DAPTOmycin (CUBICIN) 611 mg in sodium chloride 0.9 % IVPB  Status:  Discontinued     8 mg/kg  76.4 kg 224.4 mL/hr over 30 Minutes Intravenous Every 48 hours 09/06/17 1149 09/09/17 1129   09/05/17 1600  ceFEPIme (MAXIPIME) 1 g in dextrose 5 % 50 mL IVPB  Status:  Discontinued     1 g 100 mL/hr over 30 Minutes Intravenous Every 24 hours 09/04/17 1528 09/06/17 1003   09/04/17 1530  vancomycin (VANCOCIN) 1,500 mg in sodium chloride 0.9 % 500 mL IVPB     1,500 mg 250 mL/hr over 120 Minutes Intravenous STAT 09/04/17 1528 09/04/17 1911   09/04/17 1530  ceFEPIme (MAXIPIME) 2 g in dextrose 5 % 50 mL IVPB     2 g 100 mL/hr over 30 Minutes Intravenous STAT 09/04/17 1528 09/04/17 1713      Subjective:   Bertie Kawa seen and examined today.  No complaints today.  Wonders where he is and why he is here.  Denies chest pain or shortness of breath, abdominal pain.   Objective:   Vitals:   09/12/17 1457 09/12/17 2141 09/13/17 0459 09/13/17 0540  BP: (!) 99/58 (!) 105/46  (!) 108/45  Pulse: (!) 58 (!) 58  (!) 56  Resp:  20  20  Temp: 98 F (36.7 C) 98.3 F (36.8 C)  98.7 F (37.1 C)  TempSrc:  Oral  Oral  SpO2: 94% 93%  94%  Weight:  76.8 kg (169 lb 5 oz)   Height:        Intake/Output Summary (Last 24 hours) at 09/13/2017 1257 Last data filed at 09/13/2017 0953 Gross per 24 hour  Intake 1329.67 ml  Output 761 ml  Net 568.67 ml   Filed Weights   09/11/17 0121 09/12/17 0525 09/13/17 0459  Weight: 76.6 kg (168 lb 14 oz) 75.8 kg (167 lb 1.7 oz) 76.8 kg (169 lb 5 oz)   Exam  General: Well developed, elderly, NAD  HEENT: NCAT, mucous membranes moist.   Cardiovascular: S1 S2 auscultated, RRR, no rubs  Respiratory: Diminished breath sounds  Abdomen: Soft, nontender, nondistended, + bowel sounds  Extremities: warm dry without cyanosis clubbing  or edema  Neuro: AAOx2, nonfocal  Psych: Appropriate mood and affect, pleasant  Data Reviewed: I have personally reviewed following labs and imaging studies  CBC: Recent Labs  Lab 09/07/17 0947 09/08/17 0355 09/09/17 0340 09/10/17 0456 09/11/17 0651 09/12/17 0429 09/13/17 0640  WBC 9.7 8.4 10.4 7.5 8.3 7.6 7.2  NEUTROABS 7.0 6.2 7.2 5.3 5.9  --   --   HGB 9.6* 9.5* 10.2* 9.1* 8.7* 8.6* 8.6*  HCT 30.6* 30.2* 31.2* 29.4* 28.3* 27.6* 27.6*  MCV 93.0 94.1 93.7 94.5 93.7 93.9 94.2  PLT 187 223 259 286 331 338 308   Basic Metabolic Panel: Recent Labs  Lab 09/07/17 0947 09/08/17 0355 09/09/17 0340 09/10/17 0456 09/11/17 0651 09/12/17 0429 09/13/17 0640  NA 145 146* 146* 143 141 141 137  K 4.0 3.9 4.0 4.0 3.9 4.0 4.1  CL 115* 114* 114* 111 109 110 108  CO2 20* 23 22 24 24 24  21*  GLUCOSE 118* 113* 114* 112* 107* 104* 97  BUN 34* 34* 38* 39* 37* 34* 34*  CREATININE 1.40* 1.37* 1.45* 1.48* 1.53* 1.43* 1.42*  CALCIUM 8.7* 8.9 8.9 9.0 8.9 8.7* 8.7*  MG 2.1 2.0 2.1 2.2 2.1  --   --   PHOS 2.3* 2.6 2.6 3.2 3.1  --   --    GFR: Estimated Creatinine Clearance: 33.1 mL/min (A) (by C-G formula based on SCr of 1.42 mg/dL (H)). Liver Function Tests: Recent Labs  Lab 09/07/17 0947 09/08/17 0355 09/09/17 0340 09/10/17 0456 09/11/17 0651  AST 64* 54* 76* 32 37  ALT 43 46 62 41 38  ALKPHOS 156* 178* 189* 145* 134*  BILITOT 0.9 0.8 0.7 0.5 0.6  PROT 4.7* 4.6* 5.0* 4.6* 4.7*  ALBUMIN 1.8* 1.7* 1.8* 1.6* 1.6*   No results for input(s): LIPASE, AMYLASE in the last 168 hours. No results for input(s): AMMONIA in the last 168 hours. Coagulation Profile: No results for input(s): INR, PROTIME in the last 168 hours. Cardiac Enzymes: Recent Labs  Lab 09/13/17 0640  CKTOTAL 15*   BNP (last 3 results) No results for input(s): PROBNP in the last 8760 hours. HbA1C: No results for input(s): HGBA1C in the last 72 hours. CBG: No results for input(s): GLUCAP in the last 168  hours. Lipid Profile: No results for input(s): CHOL, HDL, LDLCALC, TRIG, CHOLHDL, LDLDIRECT in the last 72 hours. Thyroid Function Tests: No results for input(s): TSH, T4TOTAL, FREET4, T3FREE, THYROIDAB in the last 72 hours. Anemia Panel: No results for input(s): VITAMINB12, FOLATE, FERRITIN, TIBC, IRON, RETICCTPCT in the last 72 hours. Urine analysis:    Component Value Date/Time   COLORURINE YELLOW 09/04/2017 [place] 09/04/2017 1725   LABSPEC 1.016 09/04/2017 1725   PHURINE 5.0 09/04/2017 1725   GLUCOSEU NEGATIVE 09/04/2017 1725  HGBUR NEGATIVE 09/04/2017 [place] 09/04/2017 [place] 09/04/2017 [place] 09/04/2017 1725   NITRITE NEGATIVE 09/04/2017 1725   LEUKOCYTESUR NEGATIVE 09/04/2017 1725   Sepsis Labs: @LABRCNTIP (procalcitonin:4,lacticidven:4)  ) Recent Results (from the past 240 hour(s))  Culture, blood (Routine x 2)     Status: Abnormal   Collection Time: 09/04/17  1:15 PM  Result Value Ref Range Status   Specimen Description BLOOD LEFT FOREARM  Final   Special Requests   Final    BOTTLES DRAWN AEROBIC AND ANAEROBIC Blood Culture adequate volume   Culture  Setup Time   Final    GRAM POSITIVE COCCI AEROBIC BOTTLE ONLY CRITICAL VALUE NOTED.  VALUE IS CONSISTENT WITH PREVIOUSLY REPORTED AND CALLED VALUE.    Culture (A)  Final    STAPHYLOCOCCUS AUREUS SUSCEPTIBILITIES PERFORMED ON PREVIOUS CULTURE WITHIN THE LAST 5 DAYS.    Report Status 09/10/2017 FINAL  Final  Culture, blood (Routine x 2)     Status: Abnormal   Collection Time: 09/04/17  1:23 PM  Result Value Ref Range Status   Specimen Description BLOOD ARM RIGHT  Final   Special Requests IN PEDIATRIC BOTTLE Blood Culture adequate volume  Final   Culture  Setup Time   Final    GRAM POSITIVE COCCI IN PEDIATRIC BOTTLE CRITICAL RESULT CALLED TO, READ BACK BY AND VERIFIED WITH: G ABBOTT PHARMD 0149 09/06/17 A BROWNING    Culture METHICILLIN  RESISTANT STAPHYLOCOCCUS AUREUS (A)  Final   Report Status 09/08/2017 FINAL  Final   Organism ID, Bacteria METHICILLIN RESISTANT STAPHYLOCOCCUS AUREUS  Final      Susceptibility   Methicillin resistant staphylococcus aureus - MIC*    CIPROFLOXACIN >=8 RESISTANT Resistant     ERYTHROMYCIN <=0.25 SENSITIVE Sensitive     GENTAMICIN <=0.5 SENSITIVE Sensitive     OXACILLIN RESISTANT Resistant     TETRACYCLINE <=1 SENSITIVE Sensitive     VANCOMYCIN 1 SENSITIVE Sensitive     TRIMETH/SULFA <=10 SENSITIVE Sensitive     CLINDAMYCIN <=0.25 SENSITIVE Sensitive     RIFAMPIN <=0.5 SENSITIVE Sensitive     Inducible Clindamycin NEGATIVE Sensitive     * METHICILLIN RESISTANT STAPHYLOCOCCUS AUREUS  Blood Culture ID Panel (Reflexed)     Status: Abnormal   Collection Time: 09/04/17  1:23 PM  Result Value Ref Range Status   Enterococcus species NOT DETECTED NOT DETECTED Final   Listeria monocytogenes NOT DETECTED NOT DETECTED Final   Staphylococcus species DETECTED (A) NOT DETECTED Final    Comment: CRITICAL RESULT CALLED TO, READ BACK BY AND VERIFIED WITH: G ABBOTT PHARMD 0149 09/06/17 A BROWNING    Staphylococcus aureus DETECTED (A) NOT DETECTED Final    Comment: Methicillin (oxacillin)-resistant Staphylococcus aureus (MRSA). MRSA is predictably resistant to beta-lactam antibiotics (except ceftaroline). Preferred therapy is vancomycin unless clinically contraindicated. Patient requires contact precautions if  hospitalized. CRITICAL RESULT CALLED TO, READ BACK BY AND VERIFIED WITH: G ABBOTT PHARMD 0149 09/06/17 A BROWNING    Methicillin resistance DETECTED (A) NOT DETECTED Final    Comment: CRITICAL RESULT CALLED TO, READ BACK BY AND VERIFIED WITH: G ABBOTT PHARMD 0149 09/06/17 A BROWNING    Streptococcus species NOT DETECTED NOT DETECTED Final   Streptococcus agalactiae NOT DETECTED NOT DETECTED Final   Streptococcus pneumoniae NOT DETECTED NOT DETECTED Final   Streptococcus pyogenes NOT DETECTED NOT  DETECTED Final   Acinetobacter baumannii NOT DETECTED NOT DETECTED Final   Enterobacteriaceae species NOT DETECTED NOT DETECTED Final  Enterobacter cloacae complex NOT DETECTED NOT DETECTED Final   Escherichia coli NOT DETECTED NOT DETECTED Final   Klebsiella oxytoca NOT DETECTED NOT DETECTED Final   Klebsiella pneumoniae NOT DETECTED NOT DETECTED Final   Proteus species NOT DETECTED NOT DETECTED Final   Serratia marcescens NOT DETECTED NOT DETECTED Final   Haemophilus influenzae NOT DETECTED NOT DETECTED Final   Neisseria meningitidis NOT DETECTED NOT DETECTED Final   Pseudomonas aeruginosa NOT DETECTED NOT DETECTED Final   Candida albicans NOT DETECTED NOT DETECTED Final   Candida glabrata NOT DETECTED NOT DETECTED Final   Candida krusei NOT DETECTED NOT DETECTED Final   Candida parapsilosis NOT DETECTED NOT DETECTED Final   Candida tropicalis NOT DETECTED NOT DETECTED Final  Urine culture     Status: None   Collection Time: 09/04/17  5:42 PM  Result Value Ref Range Status   Specimen Description URINE, RANDOM  Final   Special Requests NONE  Final   Culture NO GROWTH  Final   Report Status 09/05/2017 FINAL  Final  MRSA PCR Screening     Status: None   Collection Time: 09/04/17 10:11 PM  Result Value Ref Range Status   MRSA by PCR NEGATIVE NEGATIVE Final    Comment:        The GeneXpert MRSA Assay (FDA approved for NASAL specimens only), is one component of a comprehensive MRSA colonization surveillance program. It is not intended to diagnose MRSA infection nor to guide or monitor treatment for MRSA infections.   Culture, blood (routine x 2)     Status: None   Collection Time: 09/06/17  8:12 AM  Result Value Ref Range Status   Specimen Description BLOOD RIGHT FOREARM  Final   Special Requests   Final    BOTTLES DRAWN AEROBIC ONLY Blood Culture adequate volume   Culture NO GROWTH 5 DAYS  Final   Report Status 09/11/2017 FINAL  Final  Culture, blood (routine x 2)      Status: Abnormal   Collection Time: 09/06/17  8:16 AM  Result Value Ref Range Status   Specimen Description BLOOD RIGHT HAND  Final   Special Requests IN PEDIATRIC BOTTLE Blood Culture adequate volume  Final   Culture  Setup Time   Final    GRAM POSITIVE COCCI IN PEDIATRIC BOTTLE CRITICAL RESULT CALLED TO, READ BACK BY AND VERIFIED WITH: G ABBOTT PHARMD 0023 09/07/17 A BROWNING    Culture (A)  Final    STAPHYLOCOCCUS AUREUS SUSCEPTIBILITIES PERFORMED ON PREVIOUS CULTURE WITHIN THE LAST 5 DAYS.    Report Status 09/08/2017 FINAL  Final  Blood Culture ID Panel (Reflexed)     Status: Abnormal   Collection Time: 09/06/17  8:16 AM  Result Value Ref Range Status   Enterococcus species NOT DETECTED NOT DETECTED Final   Listeria monocytogenes NOT DETECTED NOT DETECTED Final   Staphylococcus species DETECTED (A) NOT DETECTED Final    Comment: CRITICAL RESULT CALLED TO, READ BACK BY AND VERIFIED WITH: G ABBOTT PHARMD 0023 09/07/17 A BROWNING    Staphylococcus aureus DETECTED (A) NOT DETECTED Final    Comment: Methicillin (oxacillin)-resistant Staphylococcus aureus (MRSA). MRSA is predictably resistant to beta-lactam antibiotics (except ceftaroline). Preferred therapy is vancomycin unless clinically contraindicated. Patient requires contact precautions if  hospitalized. CRITICAL RESULT CALLED TO, READ BACK BY AND VERIFIED WITH: G ABBOTT PHARMD 0023 09/07/17 A BROWNING    Methicillin resistance DETECTED (A) NOT DETECTED Final    Comment: CRITICAL RESULT CALLED TO, READ BACK BY AND VERIFIED WITH:  G ABBOTT PHARMD 0023 09/07/17 A BROWNING    Streptococcus species NOT DETECTED NOT DETECTED Final   Streptococcus agalactiae NOT DETECTED NOT DETECTED Final   Streptococcus pneumoniae NOT DETECTED NOT DETECTED Final   Streptococcus pyogenes NOT DETECTED NOT DETECTED Final   Acinetobacter baumannii NOT DETECTED NOT DETECTED Final   Enterobacteriaceae species NOT DETECTED NOT DETECTED Final    Enterobacter cloacae complex NOT DETECTED NOT DETECTED Final   Escherichia coli NOT DETECTED NOT DETECTED Final   Klebsiella oxytoca NOT DETECTED NOT DETECTED Final   Klebsiella pneumoniae NOT DETECTED NOT DETECTED Final   Proteus species NOT DETECTED NOT DETECTED Final   Serratia marcescens NOT DETECTED NOT DETECTED Final   Haemophilus influenzae NOT DETECTED NOT DETECTED Final   Neisseria meningitidis NOT DETECTED NOT DETECTED Final   Pseudomonas aeruginosa NOT DETECTED NOT DETECTED Final   Candida albicans NOT DETECTED NOT DETECTED Final   Candida glabrata NOT DETECTED NOT DETECTED Final   Candida krusei NOT DETECTED NOT DETECTED Final   Candida parapsilosis NOT DETECTED NOT DETECTED Final   Candida tropicalis NOT DETECTED NOT DETECTED Final  Culture, blood (routine x 2)     Status: Abnormal   Collection Time: 09/08/17  9:20 AM  Result Value Ref Range Status   Specimen Description BLOOD RIGHT HAND  Final   Special Requests IN PEDIATRIC BOTTLE Blood Culture adequate volume  Final   Culture  Setup Time   Final    GRAM POSITIVE COCCI IN CLUSTERS IN PEDIATRIC BOTTLE CRITICAL RESULT CALLED TO, READ BACK BY AND VERIFIED WITH: PHARMD Karlene Einstein [id number] MLM    Culture METHICILLIN RESISTANT STAPHYLOCOCCUS AUREUS (A)  Final   Report Status 09/12/2017 FINAL  Final   Organism ID, Bacteria METHICILLIN RESISTANT STAPHYLOCOCCUS AUREUS  Final      Susceptibility   Methicillin resistant staphylococcus aureus - MIC*    CIPROFLOXACIN >=8 RESISTANT Resistant     ERYTHROMYCIN <=0.25 SENSITIVE Sensitive     GENTAMICIN <=0.5 SENSITIVE Sensitive     OXACILLIN RESISTANT Resistant     TETRACYCLINE <=1 SENSITIVE Sensitive     VANCOMYCIN <=0.5 SENSITIVE Sensitive     TRIMETH/SULFA <=10 SENSITIVE Sensitive     CLINDAMYCIN <=0.25 SENSITIVE Sensitive     RIFAMPIN <=0.5 SENSITIVE Sensitive     Inducible Clindamycin NEGATIVE Sensitive     * METHICILLIN RESISTANT STAPHYLOCOCCUS AUREUS  Culture, blood  (routine x 2)     Status: None (Preliminary result)   Collection Time: 09/08/17  9:28 AM  Result Value Ref Range Status   Specimen Description BLOOD LEFT HAND  Final   Special Requests IN PEDIATRIC BOTTLE Blood Culture adequate volume  Final   Culture NO GROWTH 4 DAYS  Final   Report Status PENDING  Incomplete  Culture, blood (routine x 2)     Status: None (Preliminary result)   Collection Time: 09/09/17 11:35 AM  Result Value Ref Range Status   Specimen Description BLOOD LEFT ARM  Final   Special Requests IN PEDIATRIC BOTTLE Blood Culture adequate volume  Final   Culture NO GROWTH 3 DAYS  Final   Report Status PENDING  Incomplete  Culture, blood (routine x 2)     Status: None (Preliminary result)   Collection Time: 09/09/17 11:40 AM  Result Value Ref Range Status   Specimen Description BLOOD LEFT HAND  Final   Special Requests IN PEDIATRIC BOTTLE Blood Culture adequate volume  Final   Culture NO GROWTH 3 DAYS  Final   Report Status PENDING  Incomplete      Radiology Studies: No results found.   Scheduled Meds: . chlorhexidine  15 mL Mouth Rinse BID  . cholecalciferol  2,000 Units Oral Daily  . collagenase   Topical Daily  . enoxaparin (LOVENOX) injection  30 mg Subcutaneous Q24H  . feeding supplement (ENSURE ENLIVE)  237 mL Oral BID BM  . feeding supplement (PRO-STAT SUGAR FREE 64)  30 mL Oral BID  . mouth rinse  15 mL Mouth Rinse q12n4p  . multivitamin with minerals  1 tablet Oral Daily  . senna-docusate  2 tablet Oral BID  . cyanocobalamin  1,000 mcg Oral Daily   Continuous Infusions: . DAPTOmycin (CUBICIN)  IV Stopped (09/12/17 1422)  . dextrose 5 % with KCl 20 mEq / L 20 mEq (09/13/17 0600)     LOS: 9 days   Time Spent in minutes   30 minutes  Lelon Ikard D.O. on 09/13/2017 at 12:57 PM  Between 7am to 7pm - Pager - [phone]  After 7pm go to [email or web address] - password [email or web address]  And look for the night coverage person covering for me after hours  [facility] Office  [phone]

## 2017-09-14 LAB — CULTURE, BLOOD (ROUTINE X 2)
CULTURE: NO GROWTH
CULTURE: NO GROWTH
SPECIAL REQUESTS: ADEQUATE
Special Requests: ADEQUATE

## 2017-09-14 LAB — BASIC METABOLIC PANEL
ANION GAP: 7 (ref 5–15)
BUN: 31 mg/dL — AB (ref 6–20)
CALCIUM: 8.9 mg/dL (ref 8.9–10.3)
CO2: 23 mmol/L (ref 22–32)
Chloride: 107 mmol/L (ref 101–111)
Creatinine, Ser: 1.54 mg/dL — ABNORMAL HIGH (ref 0.61–1.24)
GFR calc Af Amer: 42 mL/min — ABNORMAL LOW (ref >60)
GFR calc non Af Amer: 36 mL/min — ABNORMAL LOW (ref >60)
GLUCOSE: 94 mg/dL (ref 65–99)
Potassium: 4.2 mmol/L (ref 3.5–5.1)
Sodium: 137 mmol/L (ref 135–145)

## 2017-09-14 NOTE — Care Management (Addendum)
09/14/17 1600 Spoke with Bambi from [facility].  Pt's equipment delivered today and they are anticipating d/c for tomorrow.    09/15/17 1140  D/C order placed today.  Bambi with [facility] aware.  Medical Necessity form placed on pt's chart.  Bedside RN will call [facility] when ready.  LM for Phil, pt's son, to advise of plan for d/c and ambulance transport.

## 2017-09-14 NOTE — Progress Notes (Signed)
PROGRESS NOTE    Garrett Snow  NID:[id number] DOB: March 03, 1919 DOA: 09/04/2017 PCP: [name], MD   Chief Complaint  Patient presents with  . Altered Mental Status    Brief Narrative:  Garrett P Smootis a 84 y.o.malewith medical history significant ofHTN, DJD, Recent Right Femoral Neck Fx s/p Hip Arthroplasty 12/26, Hx of AAA, and other comorbids who presented to [name]F with AMS andincreased work of breathing. Per family patient at baseline has some dementia but is able to carry on a conversation to an extent. Patient's family noticed a decline yesterday morning and noticed that he was more confused than baseline. Work up performed at [facility] included a CXR which showed PNA and patient was started on Augmentin. Because of worsening confusion and Hypotension, he was sent to the ED for evaluation. Upon my evaluation patient is confused but is able to answer some questions. Denies any Pain. TRH was asked to admit for Sepsis and AMS.Patient's Blood Cx grew 1/2 MRSA so will repeat Blood Cx and likely narrow just to Vancomycin and Infectious Diseases was automatically consulted because of positive BCID. ID Changed Abx to Vancomycin. Blood Cultures remained persistently positive but finally showed NGTD <24 hours from Cx obtained 09/09/17; If remains Negative at 3 days will place PICC Line and D/C Back to SNF as patient is other wise medically stable.   Assessment & Plan   Sepsis secondary to MRSA bacteremia -Patient was hypotensive, tachypneic with leukocytosis on admission -Patient was diagnosed with pneumonia [facility] facility in [place] -Patient was placed on IV fluids -Blood cultures on 09/06/2017, 09/08/2017 were positive for MRSA -Repeat blood cultures on 09/09/2017 show no growth to date. -Currently unclear source of bacteremia -Echocardiogram did not mention any vegetations, mildly thickened leaflets. Given age, TEE would not be pursued at this time. -Right hip x-ray showed  replacement in good position. No acute fractures or dislocations. 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, who did not recommend intervention at this time and did not feel patient's sudural hygroma was the cause of patient's encephalopathy -hypernatremia improved -continue treatment of infection -mental status appears to be waxing and waning  Hypotension with history of hypertension -Amlodipine, Proscar held  -BP appears to be improving  Acute kidney injury and chronic kidney stage III -Resolved -Baseline creatinine approximately 1.4-1.5, on admission creatinine 2.8 -Creatinine currently 1.54 -Renal ultrasound showed no hydronephrosis.  Acute urinary retention  -Had 646 mL noted on bladder scan and required only catheter placement. 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 by previous hospitalist recommended IR for other modalities and question of IVC filter -Previous as was discussed with [name] [place] who recommended no IVC filter because occlusion above renal  vein. No appropriate imaging  can be done as contrast cannot be used to to kidney function. Cannot exclude tumor.  Recent right femoral neck fracture -Status post heme arthroplasty by [name] on 08/14/2017 -Right hip x-ray showed right hip replacement in good position with no acute fractures or dislocations. -Continue DVT prophylaxis  Bilateral lower extremity wounds/ulcers -Continue wound care  Normocytic anemia -Hemoglobin appears to be stable  History of AAA -Continue to monitor as an outpatient  Hypernatremia/hyperchloremia -Resolved, continue to monitor BMP  Hypokalemia -Resolved  Hypophosphatemia -Resolved  Elevated troponin -Likely demand ischemia in the setting of sepsis -Troponin trended downward  Elevated AST -Resolved  Severe malnutrition a context of chronic illness -Nutrition consult and appreciated, continue supplementation  DVT Prophylaxis  SCDs  Code Status: DNR  Family Communication: Son at bedside.   Disposition Plan: Admitted. Likely discharge to home with hospice on 09/15/2017  Consultants Vascular surgery, [name], via phone Interventional radiology, [name], via hpone Infectious disease Orthopedic surgery  Procedures  Echocardiogram Lower extremity doppler  Antibiotics   Anti-infectives (From admission, onward)   Start     Dose/Rate Route Frequency Ordered Stop   09/10/17 1400  DAPTOmycin (CUBICIN) 764 mg in sodium chloride 0.9 % IVPB  Status:  Discontinued     10 mg/kg  76.4 kg 224.4 mL/hr over 30 Minutes Intravenous Every 48 hours 09/09/17 1129 09/09/17 1406   09/10/17 1400  DAPTOmycin (CUBICIN) 750 mg in sodium chloride 0.9 % IVPB     750 mg 224.4 mL/hr over 30 Minutes Intravenous Every 48 hours 09/09/17 1406     09/06/17 1600  vancomycin (VANCOCIN) IVPB 1000 mg/200 mL premix  Status:  Discontinued     1,000 mg 200 mL/hr over 60 Minutes Intravenous Every 48 hours 09/04/17 1528 09/06/17 1101   09/06/17 1400  vancomycin (VANCOCIN) IVPB 750 mg/150 ml  premix  Status:  Discontinued     750 mg 150 mL/hr over 60 Minutes Intravenous Every 24 hours 09/06/17 1101 09/06/17 1149   09/06/17 1400  DAPTOmycin (CUBICIN) 611 mg in sodium chloride 0.9 % IVPB  Status:  Discontinued     8 mg/kg  76.4 kg 224.4 mL/hr over 30 Minutes Intravenous Every 48 hours 09/06/17 1149 09/09/17 1129   09/05/17 1600  ceFEPIme (MAXIPIME) 1 g in dextrose 5 % 50 mL IVPB  Status:  Discontinued     1 g 100 mL/hr over 30 Minutes Intravenous Every 24 hours 09/04/17 1528 09/06/17 1003   09/04/17 1530  vancomycin (VANCOCIN) 1,500 mg in sodium chloride 0.9 % 500 mL IVPB     1,500 mg 250 mL/hr over 120 Minutes Intravenous STAT 09/04/17 1528 09/04/17 1911   09/04/17 1530  ceFEPIme (MAXIPIME) 2 g in dextrose 5 % 50 mL IVPB     2 g 100 mL/hr over 30 Minutes Intravenous STAT 09/04/17 1528 09/04/17 1713      Subjective:   Lloyde Prosperi seen and examined today.  No complaints today. Denies pain, chest pain or shortness of breath, abdominal pain.   Objective:   Vitals:   09/13/17 1329 09/13/17 2230 09/14/17 0608 09/14/17 0635  BP: (!) 108/48 (!) 125/47 (!) 117/43   Pulse: (!) 59 74 65   Resp: 20 (!) 24 20   Temp: 98.4 F (36.9 C) 98.4 F (36.9 C) 99.8 F (37.7 C)   TempSrc: Oral Oral Oral   SpO2: 94% 97% 92%   Weight:    76.2 kg (167 lb 15.9 oz)  Height:  Intake/Output Summary (Last 24 hours) at 09/14/2017 1110 Last data filed at 09/14/2017 8182 Gross per 24 hour  Intake 709.16 ml  Output 600 ml  Net 109.16 ml   Filed Weights   09/12/17 0525 09/13/17 0459 09/14/17 0635  Weight: 75.8 kg (167 lb 1.7 oz) 76.8 kg (169 lb 5 oz) 76.2 kg (167 lb 15.9 oz)   Exam (no change from prior exam on 09/13/2017)  General: Well developed, elderly, NAD  HEENT: NCAT, mucous membranes moist.   Cardiovascular: S1 S2 auscultated, RRR, no rubs  Respiratory: Diminished breath sounds  Abdomen: Soft, nontender, nondistended, + bowel sounds  Extremities: warm dry without  cyanosis clubbing or edema  Neuro: AAOx2, nonfocal  Psych: Appropriate mood and affect, pleasant  Data Reviewed: I have personally reviewed following labs and imaging studies  CBC: Recent Labs  Lab 09/08/17 0355 09/09/17 0340 09/10/17 0456 09/11/17 0651 09/12/17 0429 09/13/17 0640  WBC 8.4 10.4 7.5 8.3 7.6 7.2  NEUTROABS 6.2 7.2 5.3 5.9  --   --   HGB 9.5* 10.2* 9.1* 8.7* 8.6* 8.6*  HCT 30.2* 31.2* 29.4* 28.3* 27.6* 27.6*  MCV 94.1 93.7 94.5 93.7 93.9 94.2  PLT 223 259 286 331 338 993   Basic Metabolic Panel: Recent Labs  Lab 09/08/17 0355 09/09/17 0340 09/10/17 0456 09/11/17 0651 09/12/17 0429 09/13/17 0640 09/14/17 0223  NA 146* 146* 143 141 141 137 137  K 3.9 4.0 4.0 3.9 4.0 4.1 4.2  CL 114* 114* 111 109 110 108 107  CO2 23 22 24 24 24  21* 23  GLUCOSE 113* 114* 112* 107* 104* 97 94  BUN 34* 38* 39* 37* 34* 34* 31*  CREATININE 1.37* 1.45* 1.48* 1.53* 1.43* 1.42* 1.54*  CALCIUM 8.9 8.9 9.0 8.9 8.7* 8.7* 8.9  MG 2.0 2.1 2.2 2.1  --   --   --   PHOS 2.6 2.6 3.2 3.1  --   --   --    GFR: Estimated Creatinine Clearance: 30.2 mL/min (A) (by C-G formula based on SCr of 1.54 mg/dL (H)). Liver Function Tests: Recent Labs  Lab 09/08/17 0355 09/09/17 0340 09/10/17 0456 09/11/17 0651  AST 54* 76* 32 37  ALT 46 62 41 38  ALKPHOS 178* 189* 145* 134*  BILITOT 0.8 0.7 0.5 0.6  PROT 4.6* 5.0* 4.6* 4.7*  ALBUMIN 1.7* 1.8* 1.6* 1.6*   No results for input(s): LIPASE, AMYLASE in the last 168 hours. No results for input(s): AMMONIA in the last 168 hours. Coagulation Profile: No results for input(s): INR, PROTIME in the last 168 hours. Cardiac Enzymes: Recent Labs  Lab 09/13/17 0640  CKTOTAL 15*   BNP (last 3 results) No results for input(s): PROBNP in the last 8760 hours. HbA1C: No results for input(s): HGBA1C in the last 72 hours. CBG: No results for input(s): GLUCAP in the last 168 hours. Lipid Profile: No results for input(s): CHOL, HDL, LDLCALC, TRIG,  CHOLHDL, LDLDIRECT in the last 72 hours. Thyroid Function Tests: No results for input(s): TSH, T4TOTAL, FREET4, T3FREE, THYROIDAB in the last 72 hours. Anemia Panel: No results for input(s): VITAMINB12, FOLATE, FERRITIN, TIBC, IRON, RETICCTPCT in the last 72 hours. Urine analysis:    Component Value Date/Time   COLORURINE YELLOW 09/04/2017 [place] 09/04/2017 1725   LABSPEC 1.016 09/04/2017 1725   PHURINE 5.0 09/04/2017 1725   GLUCOSEU NEGATIVE 09/04/2017 1725   HGBUR NEGATIVE 09/04/2017 1725   BILIRUBINUR NEGATIVE 09/04/2017 [place] 09/04/2017 [place]  NEGATIVE 09/04/2017 1725   NITRITE NEGATIVE 09/04/2017 1725   LEUKOCYTESUR NEGATIVE 09/04/2017 1725   Sepsis Labs: @LABRCNTIP (procalcitonin:4,lacticidven:4)  ) Recent Results (from the past 240 hour(s))  Culture, blood (Routine x 2)     Status: Abnormal   Collection Time: 09/04/17  1:15 PM  Result Value Ref Range Status   Specimen Description BLOOD LEFT FOREARM  Final   Special Requests   Final    BOTTLES DRAWN AEROBIC AND ANAEROBIC Blood Culture adequate volume   Culture  Setup Time   Final    GRAM POSITIVE COCCI AEROBIC BOTTLE ONLY CRITICAL VALUE NOTED.  VALUE IS CONSISTENT WITH PREVIOUSLY REPORTED AND CALLED VALUE.    Culture (A)  Final    STAPHYLOCOCCUS AUREUS SUSCEPTIBILITIES PERFORMED ON PREVIOUS CULTURE WITHIN THE LAST 5 DAYS.    Report Status 09/10/2017 FINAL  Final  Culture, blood (Routine x 2)     Status: Abnormal   Collection Time: 09/04/17  1:23 PM  Result Value Ref Range Status   Specimen Description BLOOD ARM RIGHT  Final   Special Requests IN PEDIATRIC BOTTLE Blood Culture adequate volume  Final   Culture  Setup Time   Final    GRAM POSITIVE COCCI IN PEDIATRIC BOTTLE CRITICAL RESULT CALLED TO, READ BACK BY AND VERIFIED WITH: G ABBOTT PHARMD 0149 09/06/17 A BROWNING    Culture METHICILLIN RESISTANT STAPHYLOCOCCUS AUREUS (A)  Final   Report Status 09/08/2017  FINAL  Final   Organism ID, Bacteria METHICILLIN RESISTANT STAPHYLOCOCCUS AUREUS  Final      Susceptibility   Methicillin resistant staphylococcus aureus - MIC*    CIPROFLOXACIN >=8 RESISTANT Resistant     ERYTHROMYCIN <=0.25 SENSITIVE Sensitive     GENTAMICIN <=0.5 SENSITIVE Sensitive     OXACILLIN RESISTANT Resistant     TETRACYCLINE <=1 SENSITIVE Sensitive     VANCOMYCIN 1 SENSITIVE Sensitive     TRIMETH/SULFA <=10 SENSITIVE Sensitive     CLINDAMYCIN <=0.25 SENSITIVE Sensitive     RIFAMPIN <=0.5 SENSITIVE Sensitive     Inducible Clindamycin NEGATIVE Sensitive     * METHICILLIN RESISTANT STAPHYLOCOCCUS AUREUS  Blood Culture ID Panel (Reflexed)     Status: Abnormal   Collection Time: 09/04/17  1:23 PM  Result Value Ref Range Status   Enterococcus species NOT DETECTED NOT DETECTED Final   Listeria monocytogenes NOT DETECTED NOT DETECTED Final   Staphylococcus species DETECTED (A) NOT DETECTED Final    Comment: CRITICAL RESULT CALLED TO, READ BACK BY AND VERIFIED WITH: G ABBOTT PHARMD 0149 09/06/17 A BROWNING    Staphylococcus aureus DETECTED (A) NOT DETECTED Final    Comment: Methicillin (oxacillin)-resistant Staphylococcus aureus (MRSA). MRSA is predictably resistant to beta-lactam antibiotics (except ceftaroline). Preferred therapy is vancomycin unless clinically contraindicated. Patient requires contact precautions if  hospitalized. CRITICAL RESULT CALLED TO, READ BACK BY AND VERIFIED WITH: G ABBOTT PHARMD 0149 09/06/17 A BROWNING    Methicillin resistance DETECTED (A) NOT DETECTED Final    Comment: CRITICAL RESULT CALLED TO, READ BACK BY AND VERIFIED WITH: G ABBOTT PHARMD 0149 09/06/17 A BROWNING    Streptococcus species NOT DETECTED NOT DETECTED Final   Streptococcus agalactiae NOT DETECTED NOT DETECTED Final   Streptococcus pneumoniae NOT DETECTED NOT DETECTED Final   Streptococcus pyogenes NOT DETECTED NOT DETECTED Final   Acinetobacter baumannii NOT DETECTED NOT DETECTED  Final   Enterobacteriaceae species NOT DETECTED NOT DETECTED Final   Enterobacter cloacae complex NOT DETECTED NOT DETECTED Final   Escherichia coli NOT DETECTED NOT DETECTED Final  Klebsiella oxytoca NOT DETECTED NOT DETECTED Final   Klebsiella pneumoniae NOT DETECTED NOT DETECTED Final   Proteus species NOT DETECTED NOT DETECTED Final   Serratia marcescens NOT DETECTED NOT DETECTED Final   Haemophilus influenzae NOT DETECTED NOT DETECTED Final   Neisseria meningitidis NOT DETECTED NOT DETECTED Final   Pseudomonas aeruginosa NOT DETECTED NOT DETECTED Final   Candida albicans NOT DETECTED NOT DETECTED Final   Candida glabrata NOT DETECTED NOT DETECTED Final   Candida krusei NOT DETECTED NOT DETECTED Final   Candida parapsilosis NOT DETECTED NOT DETECTED Final   Candida tropicalis NOT DETECTED NOT DETECTED Final  Urine culture     Status: None   Collection Time: 09/04/17  5:42 PM  Result Value Ref Range Status   Specimen Description URINE, RANDOM  Final   Special Requests NONE  Final   Culture NO GROWTH  Final   Report Status 09/05/2017 FINAL  Final  MRSA PCR Screening     Status: None   Collection Time: 09/04/17 10:11 PM  Result Value Ref Range Status   MRSA by PCR NEGATIVE NEGATIVE Final    Comment:        The GeneXpert MRSA Assay (FDA approved for NASAL specimens only), is one component of a comprehensive MRSA colonization surveillance program. It is not intended to diagnose MRSA infection nor to guide or monitor treatment for MRSA infections.   Culture, blood (routine x 2)     Status: None   Collection Time: 09/06/17  8:12 AM  Result Value Ref Range Status   Specimen Description BLOOD RIGHT FOREARM  Final   Special Requests   Final    BOTTLES DRAWN AEROBIC ONLY Blood Culture adequate volume   Culture NO GROWTH 5 DAYS  Final   Report Status 09/11/2017 FINAL  Final  Culture, blood (routine x 2)     Status: Abnormal   Collection Time: 09/06/17  8:16 AM  Result Value  Ref Range Status   Specimen Description BLOOD RIGHT HAND  Final   Special Requests IN PEDIATRIC BOTTLE Blood Culture adequate volume  Final   Culture  Setup Time   Final    GRAM POSITIVE COCCI IN PEDIATRIC BOTTLE CRITICAL RESULT CALLED TO, READ BACK BY AND VERIFIED WITH: G ABBOTT PHARMD 0023 09/07/17 A BROWNING    Culture (A)  Final    STAPHYLOCOCCUS AUREUS SUSCEPTIBILITIES PERFORMED ON PREVIOUS CULTURE WITHIN THE LAST 5 DAYS.    Report Status 09/08/2017 FINAL  Final  Blood Culture ID Panel (Reflexed)     Status: Abnormal   Collection Time: 09/06/17  8:16 AM  Result Value Ref Range Status   Enterococcus species NOT DETECTED NOT DETECTED Final   Listeria monocytogenes NOT DETECTED NOT DETECTED Final   Staphylococcus species DETECTED (A) NOT DETECTED Final    Comment: CRITICAL RESULT CALLED TO, READ BACK BY AND VERIFIED WITH: G ABBOTT PHARMD 0023 09/07/17 A BROWNING    Staphylococcus aureus DETECTED (A) NOT DETECTED Final    Comment: Methicillin (oxacillin)-resistant Staphylococcus aureus (MRSA). MRSA is predictably resistant to beta-lactam antibiotics (except ceftaroline). Preferred therapy is vancomycin unless clinically contraindicated. Patient requires contact precautions if  hospitalized. CRITICAL RESULT CALLED TO, READ BACK BY AND VERIFIED WITH: G ABBOTT PHARMD 0023 09/07/17 A BROWNING    Methicillin resistance DETECTED (A) NOT DETECTED Final    Comment: CRITICAL RESULT CALLED TO, READ BACK BY AND VERIFIED WITH: G ABBOTT PHARMD 0023 09/07/17 A BROWNING    Streptococcus species NOT DETECTED NOT DETECTED Final  Streptococcus agalactiae NOT DETECTED NOT DETECTED Final   Streptococcus pneumoniae NOT DETECTED NOT DETECTED Final   Streptococcus pyogenes NOT DETECTED NOT DETECTED Final   Acinetobacter baumannii NOT DETECTED NOT DETECTED Final   Enterobacteriaceae species NOT DETECTED NOT DETECTED Final   Enterobacter cloacae complex NOT DETECTED NOT DETECTED Final   Escherichia coli  NOT DETECTED NOT DETECTED Final   Klebsiella oxytoca NOT DETECTED NOT DETECTED Final   Klebsiella pneumoniae NOT DETECTED NOT DETECTED Final   Proteus species NOT DETECTED NOT DETECTED Final   Serratia marcescens NOT DETECTED NOT DETECTED Final   Haemophilus influenzae NOT DETECTED NOT DETECTED Final   Neisseria meningitidis NOT DETECTED NOT DETECTED Final   Pseudomonas aeruginosa NOT DETECTED NOT DETECTED Final   Candida albicans NOT DETECTED NOT DETECTED Final   Candida glabrata NOT DETECTED NOT DETECTED Final   Candida krusei NOT DETECTED NOT DETECTED Final   Candida parapsilosis NOT DETECTED NOT DETECTED Final   Candida tropicalis NOT DETECTED NOT DETECTED Final  Culture, blood (routine x 2)     Status: Abnormal   Collection Time: 09/08/17  9:20 AM  Result Value Ref Range Status   Specimen Description BLOOD RIGHT HAND  Final   Special Requests IN PEDIATRIC BOTTLE Blood Culture adequate volume  Final   Culture  Setup Time   Final    GRAM POSITIVE COCCI IN CLUSTERS IN PEDIATRIC BOTTLE CRITICAL RESULT CALLED TO, READ BACK BY AND VERIFIED WITH: PHARMD Karlene Einstein [id number] MLM    Culture METHICILLIN RESISTANT STAPHYLOCOCCUS AUREUS (A)  Final   Report Status 09/12/2017 FINAL  Final   Organism ID, Bacteria METHICILLIN RESISTANT STAPHYLOCOCCUS AUREUS  Final      Susceptibility   Methicillin resistant staphylococcus aureus - MIC*    CIPROFLOXACIN >=8 RESISTANT Resistant     ERYTHROMYCIN <=0.25 SENSITIVE Sensitive     GENTAMICIN <=0.5 SENSITIVE Sensitive     OXACILLIN RESISTANT Resistant     TETRACYCLINE <=1 SENSITIVE Sensitive     VANCOMYCIN <=0.5 SENSITIVE Sensitive     TRIMETH/SULFA <=10 SENSITIVE Sensitive     CLINDAMYCIN <=0.25 SENSITIVE Sensitive     RIFAMPIN <=0.5 SENSITIVE Sensitive     Inducible Clindamycin NEGATIVE Sensitive     * METHICILLIN RESISTANT STAPHYLOCOCCUS AUREUS  Culture, blood (routine x 2)     Status: None   Collection Time: 09/08/17  9:28 AM  Result  Value Ref Range Status   Specimen Description BLOOD LEFT HAND  Final   Special Requests IN PEDIATRIC BOTTLE Blood Culture adequate volume  Final   Culture NO GROWTH 5 DAYS  Final   Report Status 09/13/2017 FINAL  Final  Culture, blood (routine x 2)     Status: None (Preliminary result)   Collection Time: 09/09/17 11:35 AM  Result Value Ref Range Status   Specimen Description BLOOD LEFT ARM  Final   Special Requests IN PEDIATRIC BOTTLE Blood Culture adequate volume  Final   Culture NO GROWTH 4 DAYS  Final   Report Status PENDING  Incomplete  Culture, blood (routine x 2)     Status: None (Preliminary result)   Collection Time: 09/09/17 11:40 AM  Result Value Ref Range Status   Specimen Description BLOOD LEFT HAND  Final   Special Requests IN PEDIATRIC BOTTLE Blood Culture adequate volume  Final   Culture NO GROWTH 4 DAYS  Final   Report Status PENDING  Incomplete      Radiology Studies: No results found.   Scheduled Meds: . chlorhexidine  15  mL Mouth Rinse BID  . cholecalciferol  2,000 Units Oral Daily  . collagenase   Topical Daily  . enoxaparin (LOVENOX) injection  30 mg Subcutaneous Q24H  . feeding supplement (ENSURE ENLIVE)  237 mL Oral BID BM  . feeding supplement (PRO-STAT SUGAR FREE 64)  30 mL Oral BID  . mouth rinse  15 mL Mouth Rinse q12n4p  . multivitamin with minerals  1 tablet Oral Daily  . senna-docusate  2 tablet Oral BID  . cyanocobalamin  1,000 mcg Oral Daily   Continuous Infusions: . DAPTOmycin (CUBICIN)  IV Stopped (09/12/17 1422)  . dextrose 5 % with KCl 20 mEq / L 20 mEq (09/13/17 1653)     LOS: 10 days   Time Spent in minutes   30 minutes  Darrly Loberg D.O. on 09/14/2017 at 11:10 AM  Between 7am to 7pm - Pager - [phone]  After 7pm go to [email or web address] - password [email or web address]  And look for the night coverage person covering for me after hours  [facility] Office  [phone]

## 2017-09-15 MED ORDER — COLLAGENASE 250 UNIT/GM EX OINT: TOPICAL_OINTMENT | Freq: Every day | CUTANEOUS | 0 refills | Status: AC

## 2017-09-15 MED ORDER — PRO-STAT SUGAR FREE PO LIQD: 30.0000 mL | Freq: Two times a day (BID) | ORAL | 0 refills | Status: AC

## 2017-09-15 MED ORDER — CHOLECALCIFEROL 50 MCG (2000 UT) PO TABS: 2000.0000 [IU] | ORAL_TABLET | Freq: Every day | ORAL | Status: AC

## 2017-09-15 MED ORDER — ENSURE ENLIVE PO LIQD: 237.0000 mL | Freq: Two times a day (BID) | ORAL | 0 refills | Status: AC

## 2017-09-15 NOTE — Discharge Instructions (Signed)
Bacteremia °Bacteremia is the presence of bacteria in the blood. When bacteria enter the bloodstream, they can cause a life-threatening reaction called sepsis, which is a medical emergency. Bacteremia can spread to other parts of the body, including the heart, joints, and brain. °What are the causes? °This condition is caused by bacteria that get into the blood. Bacteria can enter the blood: °· From a skin infection or a cut on your skin. °· During an episode of pneumonia. °· From an infection in your stomach or intestine (gastrointestinal infection). °· From an infection in your bladder or urinary system (urinary tract infection). °· During a dental or medical procedure. °· After you brush your teeth so hard that your gums bleed. °· When a bacterial infection in another part of the body spreads to the blood. °· Through a dirty needle. ° °What increases the risk? °This condition is more likely to develop in: °· Children. °· Elderly adults. °· People who have a long-lasting (chronic) disease or medical condition. °· People who have an artificial joint or heart valve. °· People who have heart valve disease. °· People who have a tube, such as a catheter or IV tube, that has been inserted for a medical treatment. °· People who have a weak body defense system (immune system). °· People who use IV drugs. ° °What are the signs or symptoms? °Symptoms of this condition include: °· Fever. °· Chills. °· A racing heart. °· Shortness of breath. °· Dizziness. °· Weakness. °· Confusion. °· Nausea or vomiting. °· Diarrhea. ° °In some cases, there are no symptoms. Bacteremia that has spread to the other parts of the body may cause symptoms in those areas. °How is this diagnosed? °This condition may be diagnosed with a physical exam and tests, such as: °· A complete blood count (CBC). This test looks for signs of infection. °· Blood cultures. These look for bacteria in your blood. °· Tests of any tubes that you may have inserted into  your body, such as an IV tube or urinary catheter. These tests look for a source of infection. °· Urine tests including urine cultures. These look for bacteria in the urine that could be a source of infection. °· Imaging tests, such as an X-ray, CT scan, MRI, or heart ultrasound. These look for a source of infection in other parts of the body, such as the lungs, heart valves, or joints. ° °How is this treated? °This condition may be treated with: °· Antibiotic medicines given through an IV infusion. Depending on the source of infection, antibiotics may be needed for several weeks. At first, an antibiotic may be given to kill most types of blood bacteria. If your test results show that a certain kind of bacteria is causing problems, the antibiotic may be changed to kill only the bacteria that are causing problems. °· Antibiotics taken by mouth. °· IV fluids to support the body as you fight the infection. °· Removing any catheter or device that could be a source of infection. °· Blood pressure and breathing support, if you have sepsis. °· Surgery to control the source or spread of infection, such as: °? Removing an infected implanted device. °? Removing infected tissue or an abscess. ° °This condition is usually treated at a hospital. If you are treated at home, you may need to come back for medicines, blood tests, and evaluation. This is important. °Follow these instructions at home: °· Take over-the-counter and prescription medicines only as told by your health care provider. °·   If you were prescribed an antibiotic, take it as told by your health care provider. Do not stop taking the antibiotic even if you start to feel better. °· Rest until your condition is under control. °· Drink enough fluid to keep your urine clear or pale yellow. °· Do not smoke. If you need help quitting, ask your health care provider. °· Keep all follow-up visits as told by your health care provider. This is important. °How is this  prevented? °· Get the vaccinations that your health care provider recommends. °· Clean and cover any scrapes or cuts. °· Take good care of your skin. This includes regular bathing and moisturizing. °· Wash your hands often. °· Practice good oral hygiene. Brush your teeth two times a day and floss regularly. °Get help right away if: °· You have pain. °· You have a fever. °· You have trouble breathing. °· Your skin becomes blotchy, pale, or clammy. °· You develop confusion, dizziness, or weakness. °· You develop diarrhea. °· You develop any new symptoms after treatment. °Summary °· Bacteremia is the presence of bacteria in the blood. When bacteria enter the bloodstream, they can cause a life- threatening reaction called sepsis. °· Children and elderly adults are at increased risk of bacteremia. Other risk factors include having a long-lasting (chronic) disease or a weak immune system, having an artificial joint or heart valve, having heart valve disease, having tubes that were inserted in the body for medical treatment, or using IV drugs. °· Some symptoms of bacteremia include fever, chills, shortness of breath, confusion, nausea or vomiting, and diarrhea. °· Tests may be done to diagnose a source of infection that led to bacteremia. These tests may include blood tests, urine tests, and imaging tests. °· Bacteremia is usually treated with antibiotics, usually in a hospital. °This information is not intended to replace advice given to you by your health care provider. Make sure you discuss any questions you have with your health care provider. °Document Released: 05/20/2006 Document Revised: 07/03/2016 Document Reviewed: 07/03/2016 °[employer] Interactive Patient Education © 2018 [employer] ° °

## 2017-09-15 NOTE — Progress Notes (Signed)
Garrett Snow to be D/C'd Home per MD order.  Discussed with the patient and family and all questions fully answered.  IV catheter discontinued intact. Site without signs and symptoms of complications. Dressing and pressure applied.  An After Visit Summary was printed and given to the patient. Patient received prescription.  D/c education completed with patient/family including follow up instructions, medication list, d/c activities limitations if indicated, with other d/c instructions as indicated by MD - patient able to verbalize understanding, all questions fully answered.   Patient instructed to return to ED, call 911, or call MD for any changes in condition.   Patient escorted via stretcher, and D/C home via [facility] .  Richardean Chimera 09/15/2017 11:37 AM

## 2017-09-15 NOTE — Discharge Summary (Signed)
Physician Discharge Summary  Garrett Snow ERD:[id number] DOB: 27-Oct-1918 DOA: 09/04/2017  PCP: [name], MD  Admit date: 09/04/2017 Discharge date: 09/15/2017  Time spent: 45 minutes  Recommendations for Outpatient Follow-up:  Patient will be discharged to home with hospice.  May follow up with primary care physician as needed.   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 Raiford Noble Parke Simmers [place] is a 84 y.o. male with medical history significant of HTN, DJD, Recent Right Femoral Neck Fx s/p Hip Arthroplasty 12/26, Hx of AAA, and other comorbids who presented to [name] from SNF with AMS and increased work of breathing. Per family patient at baseline has some dementia but is able to carry on a conversation to an extent. Patient's family noticed a decline yesterday morning and noticed that he was more confused than baseline. Work up performed at [facility] included a CXR which showed PNA and patient was started on Augmentin. Because of worsening confusion and Hypotension, he was sent to the ED for evaluation. Upon my evaluation patient is confused but is able to answer some questions. Denies any Pain. Patient's son is at bedside who states he has gotten worse. TRH was  asked to admit for Sepsis and AMS.   Hospital Course:  Sepsis secondary to MRSA bacteremia -Patient was hypotensive, tachypneic with leukocytosis on admission -Patient was diagnosed with pneumonia [facility] facility in [place] -Patient was placed on IV fluids -Blood cultures on 09/06/2017, 09/08/2017 were positive for MRSA -Repeat blood cultures on 09/09/2017 show no growth to date. -Currently unclear source of bacteremia -Echocardiogram did not mention any vegetations, mildly thickened leaflets. Given age, TEE would not be pursued at this time. -Right hip x-ray showed replacement in good position. No acute fractures or dislocations. 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, who did not recommend intervention at this time and did not feel patient's sudural hygroma was the cause of patient's encephalopathy -hypernatremia improved -continue treatment of infection -mental status appears to be waxing and waning- discussed with family, may improve once patient is home and back in familiar surroundings  Hypotension with history of hypertension -Amlodipine, Proscar held  -BP appears to be improving  Acute kidney injury and chronic kidney stage III -Resolved -Baseline creatinine approximately 1.4-1.5, on admission creatinine 2.8 -Creatinine down to 1.54 -Renal ultrasound showed no hydronephrosis.  Acute urinary retention  -Had 646 mL noted on bladder scan  and required only  catheter placement. 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 by previous hospitalist recommended IR for other modalities and question of IVC filter -Previous as was discussed with [name] [place] who recommended no IVC filter because occlusion above renal vein. No appropriate imaging can be done as contrast cannot be used to to kidney function. Cannot exclude tumor.  Recent right femoral neck fracture -Status post heme arthroplasty by [name] on 08/14/2017 -Right hip x-ray showed right hip replacement in good position with no acute fractures or dislocations. -Continue DVT prophylaxis  Bilateral lower extremity wounds/ulcers -Continue wound care  Normocytic anemia -Hemoglobin appears to be stable  History of AAA -Continue to monitor as an outpatient  Hypernatremia/hyperchloremia -Resolved, continue to monitor BMP  Hypokalemia -Resolved  Hypophosphatemia -Resolved  Elevated troponin -Likely demand ischemia in the setting of sepsis -Troponin trended downward  Elevated AST -Resolved  Severe malnutrition a context of chronic illness -Nutrition consult and appreciated, continue supplementation  Code status: DNR  Consultants Vascular surgery, [name], via phone Interventional radiology, [name], via hpone Infectious disease Orthopedic surgery  Procedures  Echocardiogram Lower extremity doppler  Discharge Exam: Vitals:   09/15/17 0000 09/15/17 0538  BP:  (!) 109/57  Pulse:  61  Resp:  20  Temp: 99.3 F (37.4 C) 98.4 F (36.9 C)  SpO2:  94%    General: Well developed, elderly, NAD  HEENT:  NCAT, mucous membranes moist.  Cardiovascular: S1 S2 auscultated, RRR, no murmurs  Respiratory: Diminished but clear  Abdomen: Soft, nontender, nondistended, + bowel sounds  Extremities: warm dry without cyanosis clubbing or edema  Neuro: AAOx2, nonfocal  Psych: Appropriate and pleasant  Discharge Instructions  Allergies as of 09/15/2017   No Known Allergies     Medication List    STOP taking these medications   amoxicillin-clavulanate 875-125 MG tablet Commonly known as:  AUGMENTIN   azithromycin 500 MG tablet Commonly known as:  ZITHROMAX   enoxaparin 40 MG/0.4ML injection Commonly known as:  LOVENOX   finasteride 5 MG tablet Commonly known as:  PROSCAR   furosemide 40 MG tablet Commonly known as:  LASIX   valACYclovir 1000 MG tablet Commonly known as:  VALTREX     TAKE these medications   acetaminophen 500 MG tablet Commonly known as:  TYLENOL Take 1,000 mg by mouth 2 (two) times daily.   collagenase ointment Commonly known as:  SANTYL Apply topically daily. Start taking on:  09/16/2017   cyanocobalamin 500 MCG tablet Take 1,000 mcg by mouth daily.   feeding supplement (ENSURE ENLIVE) Liqd Take 237 mLs by mouth 2 (two) times daily between meals.   feeding supplement (PRO-STAT SUGAR FREE 64) Liqd Take 30 mLs by mouth 2 (two) times daily.   ICAPS AREDS 2 Caps Take 2 capsules by mouth 2 (two) times daily.   iron polysaccharides 150 MG capsule Commonly known as:  FERREX 150 TAKE ONE CAPSULE BY MOUTH TWICE DAILY   sennosides-docusate sodium 8.6-50 MG tablet Commonly known as:  SENOKOT-S Take 2 tablets by mouth daily.   sodium chloride 0.65 % Soln nasal spray Commonly known as:  OCEAN Place 1 spray into both nostrils as needed for congestion.  St Johns Wort 150 MG Caps Take 1 capsule by mouth daily.   traMADol 50 MG tablet Commonly known as:  ULTRAM Take 50 mg by mouth every 6 (six) hours as needed for moderate pain.   Vitamin D 2000  units Caps Take 2,000 Units by mouth daily. What changed:  Another medication with the same name was added. Make sure you understand how and when to take each.   cholecalciferol 1000 units tablet Commonly known as:  VITAMIN D Take 2,000 Units by mouth daily. What changed:  Another medication with the same name was added. Make sure you understand how and when to take each.   Cholecalciferol 2000 units Tabs Take 1 tablet (2,000 Units total) by mouth daily. Start taking on:  09/16/2017 What changed:  You were already taking a medication with the same name, and this prescription was added. Make sure you understand how and when to take each.      No Known Allergies  Contact information for follow-up providers    [name], MD. Schedule an appointment as soon as possible for a visit in 1 week(s).   Specialty:  Internal Medicine Why:  Hospital follow up as needed Contact information: [place] [id number] [phone]            Contact information for after-discharge care    Destination    [facility] Follow up.   Service:  Skilled Nursing Contact information: [address] [place] [phone]                   The results of significant diagnostics from this hospitalization (including imaging, microbiology, ancillary and laboratory) are listed below for reference.    Significant Diagnostic Studies: Ct Abdomen Pelvis Wo Contrast  Result Date: 08/17/2017 CLINICAL DATA:  Anemia. Unexplained. Rule out retroperitoneal hematoma EXAM: CT ABDOMEN AND PELVIS WITHOUT CONTRAST TECHNIQUE: Multidetector CT imaging of the abdomen and pelvis was performed following the standard protocol without IV contrast. COMPARISON:  02/15/2009 FINDINGS: Lower chest: Small bilateral pleural effusions identified. Hepatobiliary: No suspicious liver abnormality. The gallbladder appears normal. Pancreas: No pancreatic ductal  dilatation or inflammation. There is a cystic mass within the neck of pancreas measuring 3.9 cm, image 17 of series 2. Incompletely characterized without IV contrast. Spleen: Calcified granulomas noted within the spleen. Adrenals/Urinary Tract: Stable 1.2 cm right adrenal nodule compatible with a benign adenoma. Normal appearance of the left adrenal gland. Bilateral renal cortical scarring identified. Multiple kidney cysts are identified which are incompletely characterized without IV contrast material. The largest arises from the inferior pole of the left kidney measuring 3.3 cm. No mass or hydronephrosis identified. Urinary bladder appears normal. Stomach/Bowel: Stomach is within normal limits. No evidence of bowel wall thickening, distention, or inflammatory changes. Vascular/Lymphatic: Infrarenal abdominal aorta is ectatic measuring 2.5 cm. No adenopathy. No pelvic or inguinal adenopathy identified. Reproductive: Prostate is unremarkable. Other: Post operative change from right hip arthroplasty identified. Gas is identified within the surrounding soft tissues. There is edema of the overlying subcutaneous fat and muscle surrounding the right hip and femur. See report from CT of the right hip also performed today. No evidence for retroperitoneal or intraperitoneal hemorrhage. Bilateral inguinal hernias are identified which contain fat only. There is also a hernia involving the lower ventral abdominal wall containing fat only. Musculoskeletal: Status post right hip hemiarthroplasty. See report from CT of the right femur from today. Multi level degenerative disc disease is identified  within the lumbar spine. IMPRESSION: 1. No evidence for intraperitoneal or retroperitoneal hematoma. 2. Postoperative changes from right hip hemiarthroplasty. See report from CT of the right lower extremity performed today. 3. There is a cystic lesion within the neck of pancreas, new from 02/15/2009. Incompletely characterized without  IV contrast material. Consider more definitive characterization with nonemergent pancreas protocol MRI. 4. Aortic Atherosclerosis (ICD10-I70.0). Ectatic abdominal aorta at risk for aneurysm development. Recommend followup by ultrasound in 5 years. This recommendation follows ACR consensus guidelines: White Paper of the ACR Incidental Findings Committee II on Vascular Findings. J Am Coll Radiol 2013; 10:789-794. 5. Small bilateral pleural effusions 6. Incidental note of right adrenal gland adenoma and bilateral kidney cysts. Electronically Signed   By: Kerby Moors M.D.   On: 08/17/2017 12:56   Dg Chest 2 View  Result Date: 09/04/2017 CLINICAL DATA:  Shortness of breath. EXAM: CHEST  2 VIEW COMPARISON:  Radiographs of August 25, 2009. FINDINGS: Stable cardiomegaly. Atherosclerosis of thoracic aorta is noted. No pneumothorax is noted. No significant pleural effusion is noted. Bony thorax is unremarkable. IMPRESSION: Aortic atherosclerosis.  No acute cardiopulmonary abnormality seen. Electronically Signed   By: Marijo Conception, M.D.   On: 09/04/2017 15:21   Ct Head Wo Contrast  Result Date: 09/04/2017 CLINICAL DATA:  Altered mental status EXAM: CT HEAD WITHOUT CONTRAST TECHNIQUE: Contiguous axial images were obtained from the base of the skull through the vertex without intravenous contrast. COMPARISON:  None. FINDINGS: Brain: Predominantly low attenuation collection overlying the right convexity measures up to 10 mm in thickness. There is no associated midline shift or other mass effect. There is periventricular hypoattenuation compatible with chronic microvascular disease. Vascular: No hyperdense vessel or unexpected calcification. Skull: Normal visualized skull base, calvarium and extracranial soft tissues. Sinuses/Orbits: No sinus fluid levels or advanced mucosal thickening. No mastoid effusion. Normal orbits. 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 Electronically Signed   By: Ulyses Jarred M.D.   On: 09/04/2017 16:32   US Renal  Result Date: 09/04/2017 CLINICAL DATA:  Acute kidney injury. EXAM: RENAL / URINARY TRACT ULTRASOUND COMPLETE COMPARISON:  08/09/2017 unenhanced CT abdomen/pelvis. FINDINGS: Right Kidney: Length: 11.8 cm. Echogenic and mildly atrophic right renal parenchyma. No right hydronephrosis. Simple exophytic 2.6 x 2.6 x 3.7 cm upper right renal cyst. Additional simple 1.2 cm interpolar right renal cyst. Incidentally noted nonocclusive thrombosis of IVC at the level of the right renal vein with IVC thrombus measuring up to 3.1 x 1.7 cm. Left Kidney: Length: 11.4 cm. Echogenic and mildly atrophic left renal parenchyma. No left hydronephrosis. Simple exophytic 4.0 x 3.2 x 3.2 cm lower left renal cyst. Limited visualization of additional simple appearing 2.1 cm renal cyst in upper left kidney. Bladder: Foley catheter is in place within minimally distended urinary bladder. Suggestion of mild diffuse bladder wall thickening and trabeculation. IMPRESSION: 1. Incidentally noted nonocclusive thrombosis of the inferior vena cava at the level of the right renal vein. 2. No hydronephrosis. 3. Echogenic mildly atrophic kidneys, compatible with acute on chronic nonspecific renal parenchymal disease. 4. Simple bilateral renal cysts. 5. Foley catheter in place within the minimally distended urinary bladder. Suggestion of mild diffuse bladder wall thickening and trabeculation, probably due to chronic bladder outlet obstruction. Electronically Signed   By: Ilona Sorrel M.D.   On: 09/04/2017 20:55   Ct Femur Right Wo Contrast  Result Date: 08/17/2017 CLINICAL DATA:  Anemia status post right hip hemiarthroplasty on August 14, 2017. Evaluate for hematoma. EXAM: CT OF THE LOWER RIGHT EXTREMITY WITHOUT CONTRAST TECHNIQUE: Multidetector CT imaging of the right lower extremity was performed  according to the standard protocol. COMPARISON:  Right hip x-rays dated August 14, 2017. FINDINGS: Bones/Joint/Cartilage Postsurgical changes related to right hip hemiarthroplasty. No evidence of hardware failure or loosening. No acute fracture or malalignment. Small left knee joint effusion. Moderate left knee Baker cyst. Ligaments Suboptimally assessed by CT. Muscles and Tendons Small, thin low-density fluid collection containing a few foci of air overlying the vastus lateralis muscle is favored to represent a postsurgical seroma. Mild stranding and foci of air within the left adductor compartment is favored to be postsurgical. Soft tissues No large hematoma seen. Soft tissue stranding and edema overlying the right hip. IMPRESSION: 1. Postsurgical changes within the soft tissues about the right hip related to recent hemiarthroplasty. No large hematoma. Electronically Signed   By: Titus Dubin M.D.   On: 08/17/2017 13:00   Dg Chest Port 1 View  Result Date: 09/06/2017 CLINICAL DATA:  Shortness of breath. EXAM: PORTABLE CHEST 1 VIEW COMPARISON:  09/04/2017 FINDINGS: There is no evidence of pulmonary edema, consolidation, pneumothorax, nodule or pleural fluid. The heart size is normal. Stable atherosclerosis and tortuosity of the thoracic aorta. IMPRESSION: No active disease. Electronically Signed   By: Aletta Edouard M.D.   On: 09/06/2017 09:44   Dg Hip Unilat With Pelvis 2-3 Views Right  Result Date: 09/07/2017 CLINICAL DATA:  Shortness of breath.  Recent hip replacement. EXAM: DG HIP (WITH OR WITHOUT PELVIS) 2-3V RIGHT COMPARISON:  August 14, 2017 FINDINGS: Right hip replacement is in good position. No acute fractures or dislocations identified. IMPRESSION: Negative. Electronically Signed   By: Dorise Bullion III M.D   On: 09/07/2017 17:37    Microbiology: Recent Results (from the past 240 hour(s))  Culture, blood (routine x 2)     Status: None   Collection Time: 09/06/17  8:12 AM  Result  Value Ref Range Status   Specimen Description BLOOD RIGHT FOREARM  Final   Special Requests   Final    BOTTLES DRAWN AEROBIC ONLY Blood Culture adequate volume   Culture NO GROWTH 5 DAYS  Final   Report Status 09/11/2017 FINAL  Final  Culture, blood (routine x 2)     Status: Abnormal   Collection Time: 09/06/17  8:16 AM  Result Value Ref Range Status   Specimen Description BLOOD RIGHT HAND  Final   Special Requests IN PEDIATRIC BOTTLE Blood Culture adequate volume  Final   Culture  Setup Time   Final    GRAM POSITIVE COCCI IN PEDIATRIC BOTTLE CRITICAL RESULT CALLED TO, READ BACK BY AND VERIFIED WITH: G ABBOTT PHARMD 0023 09/07/17 A BROWNING    Culture (A)  Final    STAPHYLOCOCCUS AUREUS SUSCEPTIBILITIES PERFORMED ON PREVIOUS CULTURE WITHIN THE LAST 5 DAYS.    Report Status 09/08/2017 FINAL  Final  Blood Culture ID Panel (Reflexed)     Status: Abnormal   Collection Time: 09/06/17  8:16 AM  Result Value Ref Range Status   Enterococcus species NOT DETECTED NOT DETECTED Final   Listeria monocytogenes NOT DETECTED NOT DETECTED Final   Staphylococcus species DETECTED (A) NOT DETECTED Final    Comment: CRITICAL RESULT CALLED TO, READ BACK BY AND VERIFIED WITH: G ABBOTT PHARMD 0023 09/07/17 A BROWNING    Staphylococcus aureus DETECTED (A) NOT DETECTED Final    Comment: Methicillin (oxacillin)-resistant Staphylococcus  aureus (MRSA). MRSA is predictably resistant to beta-lactam antibiotics (except ceftaroline). Preferred therapy is vancomycin unless clinically contraindicated. Patient requires contact precautions if  hospitalized. CRITICAL RESULT CALLED TO, READ BACK BY AND VERIFIED WITH: G ABBOTT PHARMD 0023 09/07/17 A BROWNING    Methicillin resistance DETECTED (A) NOT DETECTED Final    Comment: CRITICAL RESULT CALLED TO, READ BACK BY AND VERIFIED WITH: G ABBOTT PHARMD 0023 09/07/17 A BROWNING    Streptococcus species NOT DETECTED NOT DETECTED Final   Streptococcus agalactiae NOT  DETECTED NOT DETECTED Final   Streptococcus pneumoniae NOT DETECTED NOT DETECTED Final   Streptococcus pyogenes NOT DETECTED NOT DETECTED Final   Acinetobacter baumannii NOT DETECTED NOT DETECTED Final   Enterobacteriaceae species NOT DETECTED NOT DETECTED Final   Enterobacter cloacae complex NOT DETECTED NOT DETECTED Final   Escherichia coli NOT DETECTED NOT DETECTED Final   Klebsiella oxytoca NOT DETECTED NOT DETECTED Final   Klebsiella pneumoniae NOT DETECTED NOT DETECTED Final   Proteus species NOT DETECTED NOT DETECTED Final   Serratia marcescens NOT DETECTED NOT DETECTED Final   Haemophilus influenzae NOT DETECTED NOT DETECTED Final   Neisseria meningitidis NOT DETECTED NOT DETECTED Final   Pseudomonas aeruginosa NOT DETECTED NOT DETECTED Final   Candida albicans NOT DETECTED NOT DETECTED Final   Candida glabrata NOT DETECTED NOT DETECTED Final   Candida krusei NOT DETECTED NOT DETECTED Final   Candida parapsilosis NOT DETECTED NOT DETECTED Final   Candida tropicalis NOT DETECTED NOT DETECTED Final  Culture, blood (routine x 2)     Status: Abnormal   Collection Time: 09/08/17  9:20 AM  Result Value Ref Range Status   Specimen Description BLOOD RIGHT HAND  Final   Special Requests IN PEDIATRIC BOTTLE Blood Culture adequate volume  Final   Culture  Setup Time   Final    GRAM POSITIVE COCCI IN CLUSTERS IN PEDIATRIC BOTTLE CRITICAL RESULT CALLED TO, READ BACK BY AND VERIFIED WITH: PHARMD Karlene Einstein [id number]    Culture METHICILLIN RESISTANT STAPHYLOCOCCUS AUREUS (A)  Final   Report Status 09/12/2017 FINAL  Final   Organism ID, Bacteria METHICILLIN RESISTANT STAPHYLOCOCCUS AUREUS  Final      Susceptibility   Methicillin resistant staphylococcus aureus - MIC*    CIPROFLOXACIN >=8 RESISTANT Resistant     ERYTHROMYCIN <=0.25 SENSITIVE Sensitive     GENTAMICIN <=0.5 SENSITIVE Sensitive     OXACILLIN RESISTANT Resistant     TETRACYCLINE <=1 SENSITIVE Sensitive      VANCOMYCIN <=0.5 SENSITIVE Sensitive     TRIMETH/SULFA <=10 SENSITIVE Sensitive     CLINDAMYCIN <=0.25 SENSITIVE Sensitive     RIFAMPIN <=0.5 SENSITIVE Sensitive     Inducible Clindamycin NEGATIVE Sensitive     * METHICILLIN RESISTANT STAPHYLOCOCCUS AUREUS  Culture, blood (routine x 2)     Status: None   Collection Time: 09/08/17  9:28 AM  Result Value Ref Range Status   Specimen Description BLOOD LEFT HAND  Final   Special Requests IN PEDIATRIC BOTTLE Blood Culture adequate volume  Final   Culture NO GROWTH 5 DAYS  Final   Report Status 09/13/2017 FINAL  Final  Culture, blood (routine x 2)     Status: None   Collection Time: 09/09/17 11:35 AM  Result Value Ref Range Status   Specimen Description BLOOD LEFT ARM  Final   Special Requests IN PEDIATRIC BOTTLE Blood Culture adequate volume  Final   Culture NO GROWTH 5 DAYS  Final   Report Status 09/14/2017 FINAL  Final  Culture, blood (routine x 2)     Status: None   Collection Time: 09/09/17 11:40 AM  Result Value Ref Range Status   Specimen Description BLOOD LEFT HAND  Final   Special Requests IN PEDIATRIC BOTTLE Blood Culture adequate volume  Final   Culture NO GROWTH 5 DAYS  Final   Report Status 09/14/2017 FINAL  Final     Labs: Basic Metabolic Panel: Recent Labs  Lab 09/09/17 0340 09/10/17 0456 09/11/17 0651 09/12/17 0429 09/13/17 0640 09/14/17 0223  NA 146* 143 141 141 137 137  K 4.0 4.0 3.9 4.0 4.1 4.2  CL 114* 111 109 110 108 107  CO2 22 24 24 24  21* 23  GLUCOSE 114* 112* 107* 104* 97 94  BUN 38* 39* 37* 34* 34* 31*  CREATININE 1.45* 1.48* 1.53* 1.43* 1.42* 1.54*  CALCIUM 8.9 9.0 8.9 8.7* 8.7* 8.9  MG 2.1 2.2 2.1  --   --   --   PHOS 2.6 3.2 3.1  --   --   --    Liver Function Tests: Recent Labs  Lab 09/09/17 0340 09/10/17 0456 09/11/17 0651  AST 76* 32 37  ALT 62 41 38  ALKPHOS 189* 145* 134*  BILITOT 0.7 0.5 0.6  PROT 5.0* 4.6* 4.7*  ALBUMIN 1.8* 1.6* 1.6*   No results for input(s): LIPASE,  AMYLASE in the last 168 hours. No results for input(s): AMMONIA in the last 168 hours. CBC: Recent Labs  Lab 09/09/17 0340 09/10/17 0456 09/11/17 0651 09/12/17 0429 09/13/17 0640  WBC 10.4 7.5 8.3 7.6 7.2  NEUTROABS 7.2 5.3 5.9  --   --   HGB 10.2* 9.1* 8.7* 8.6* 8.6*  HCT 31.2* 29.4* 28.3* 27.6* 27.6*  MCV 93.7 94.5 93.7 93.9 94.2  PLT 259 286 331 338 369   Cardiac Enzymes: Recent Labs  Lab 09/13/17 0640  CKTOTAL 15*   BNP: BNP (last 3 results) No results for input(s): BNP in the last 8760 hours.  ProBNP (last 3 results) No results for input(s): PROBNP in the last 8760 hours.  CBG: No results for input(s): GLUCAP in the last 168 hours.     Signed:  Cristal Ford  [facility] 09/15/2017, 10:52 AM

## 2017-10-15 ENCOUNTER — Inpatient Hospital Stay: Payer: Medicare Other | Admitting: Internal Medicine

## 2017-11-18 DEATH — deceased

## 2018-09-11 IMAGING — DX DG CHEST 1V PORT
1 series · 1 of 1 positions shown · non-contrast
Comparison: 09/04/2017

CLINICAL DATA: Shortness of breath.

EXAM:
PORTABLE CHEST 1 VIEW

[chest ap]
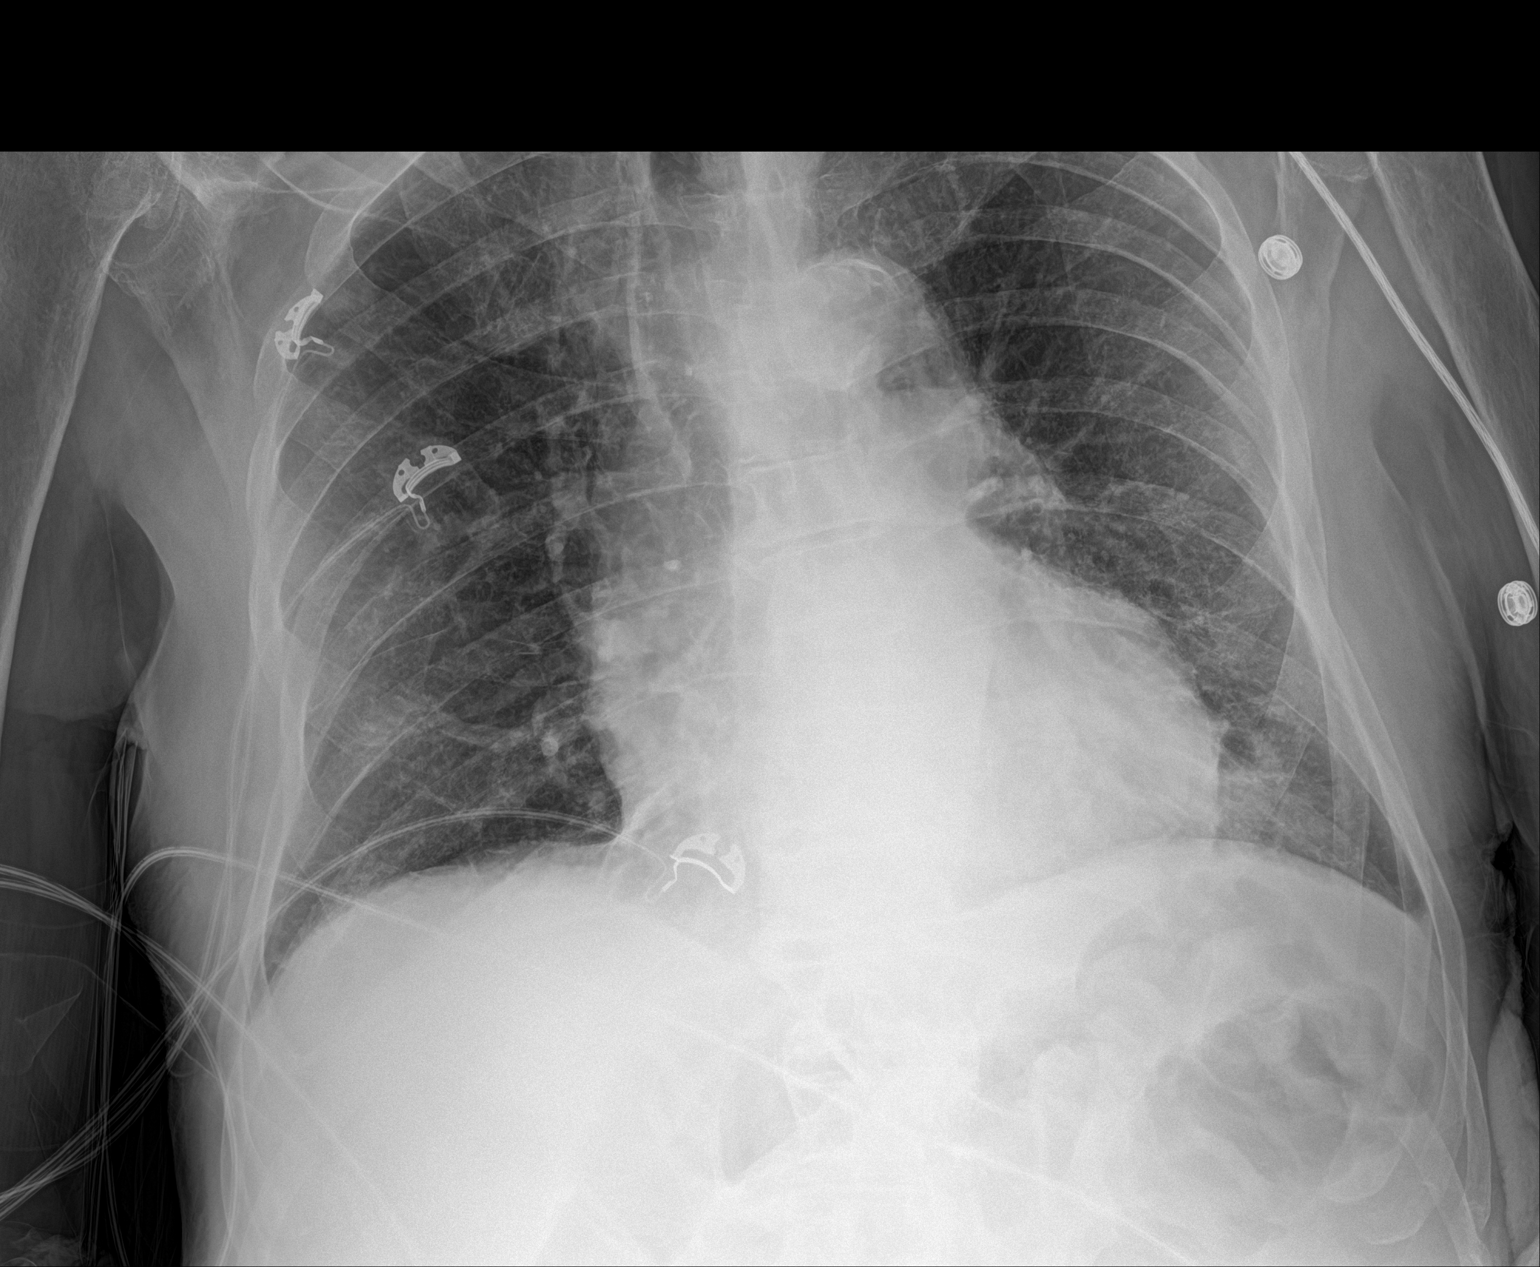

[1 of 1 positions shown; findings below may reference images not displayed]

FINDINGS: There is no evidence of pulmonary edema, consolidation,
pneumothorax, nodule or pleural fluid. The heart size is normal.
Stable atherosclerosis and tortuosity of the thoracic aorta.
IMPRESSION: No active disease.

## 2018-09-12 IMAGING — DX DG HIP (WITH OR WITHOUT PELVIS) 2-3V*R*
4 series · 4 of 4 positions shown · non-contrast
Comparison: August 14, 2017

CLINICAL DATA: Shortness of breath.  Recent hip replacement.

EXAM:
DG HIP (WITH OR WITHOUT PELVIS) 2-3V RIGHT

[t pelvis ap (1 of 2)]
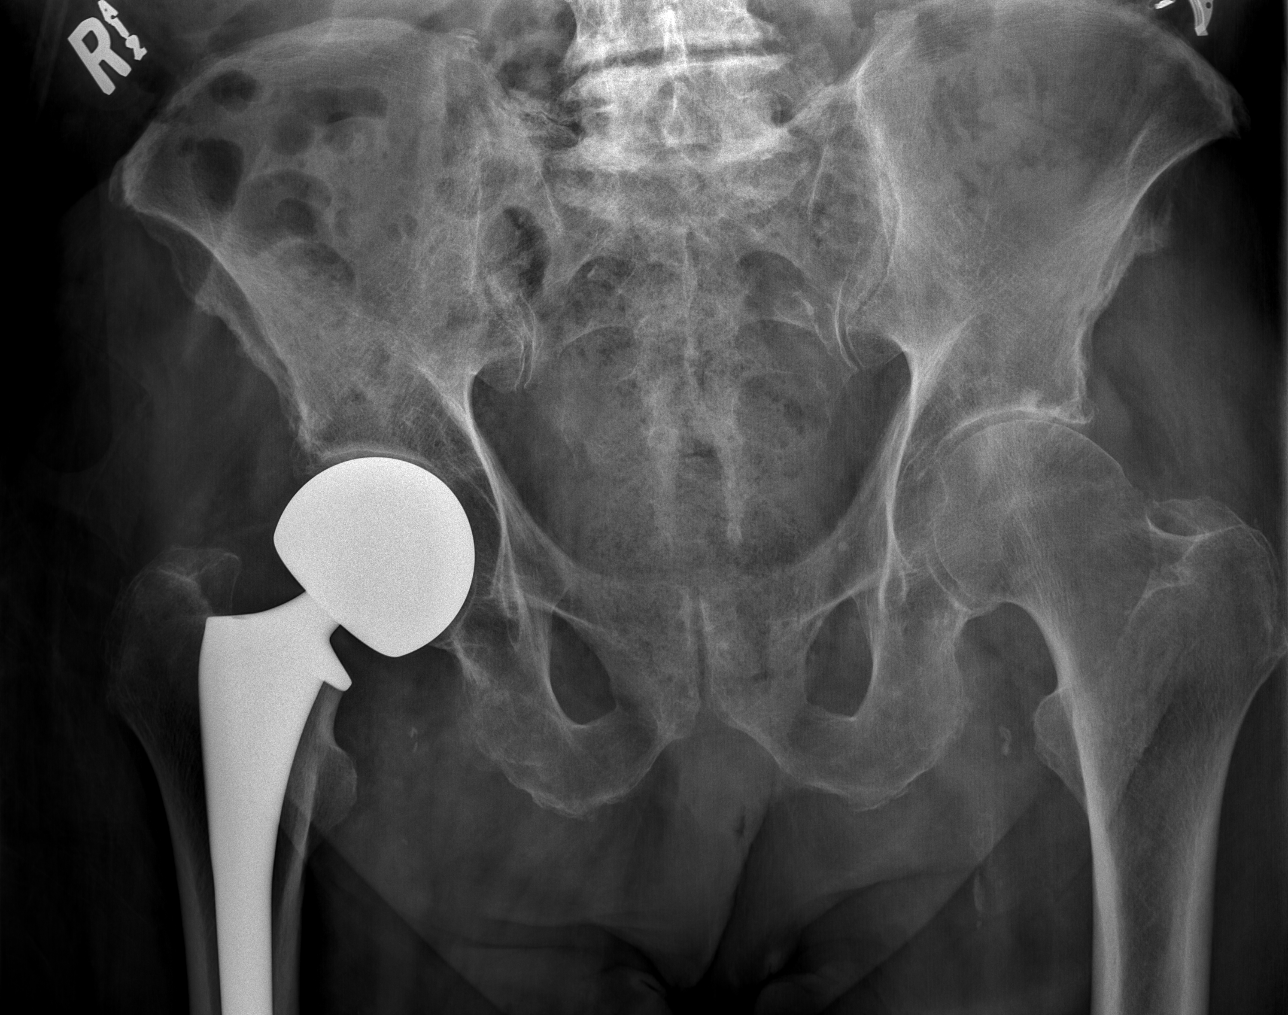

[t pelvis ap (2 of 2)]
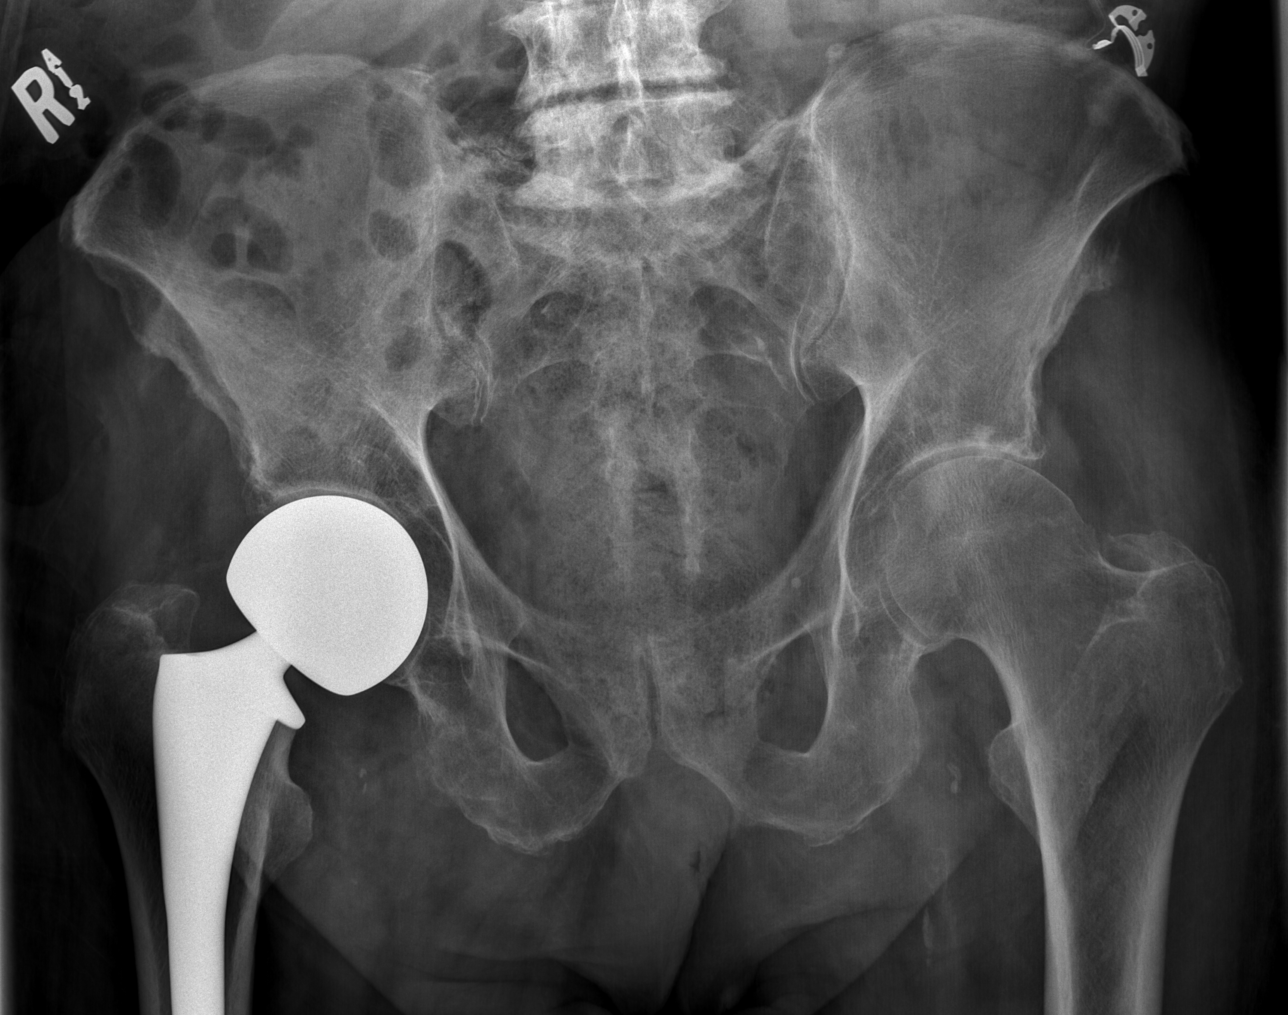

[t hip ap right]
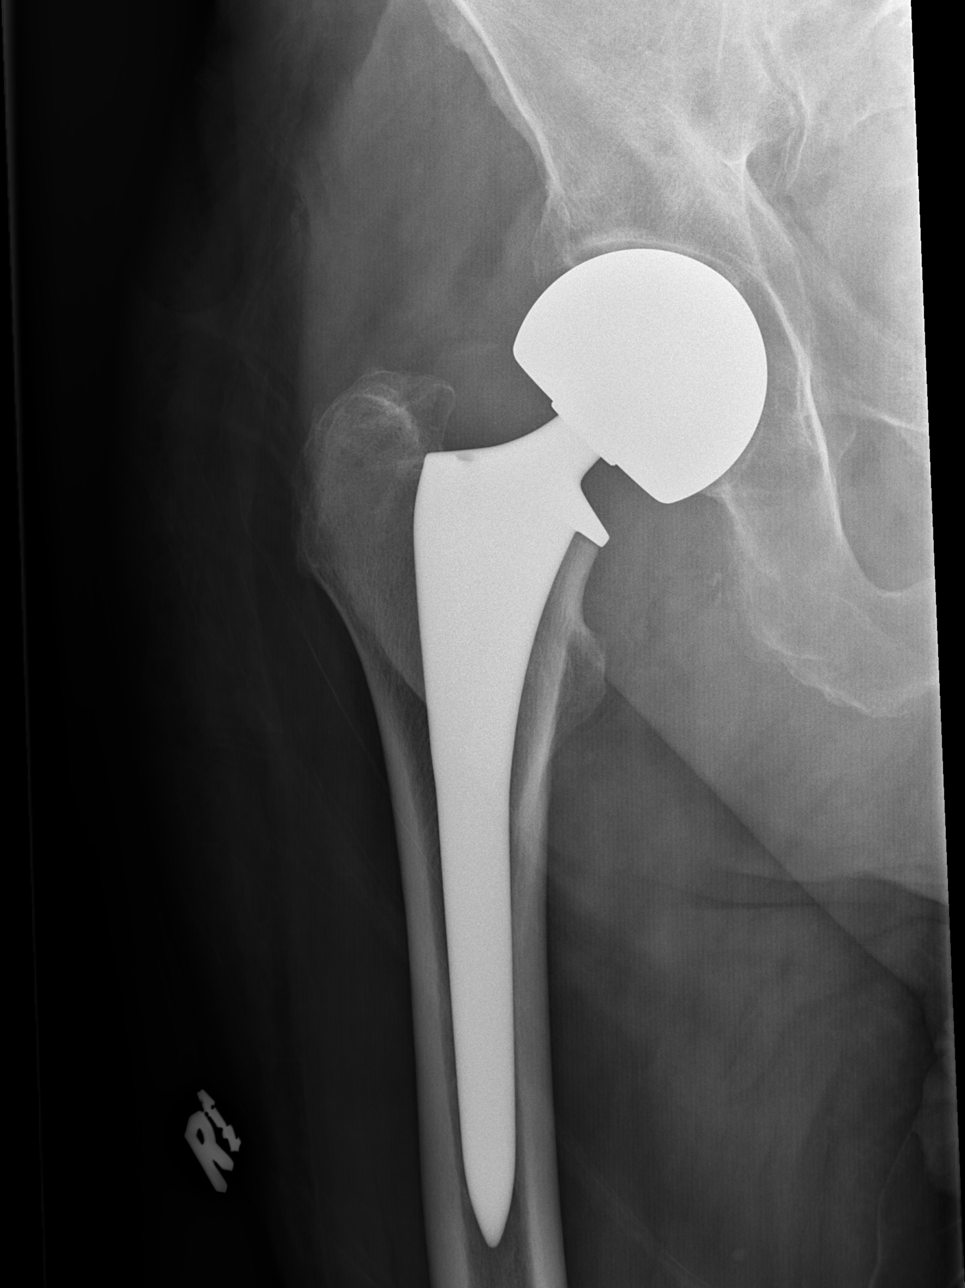

[t hip frog leg right]
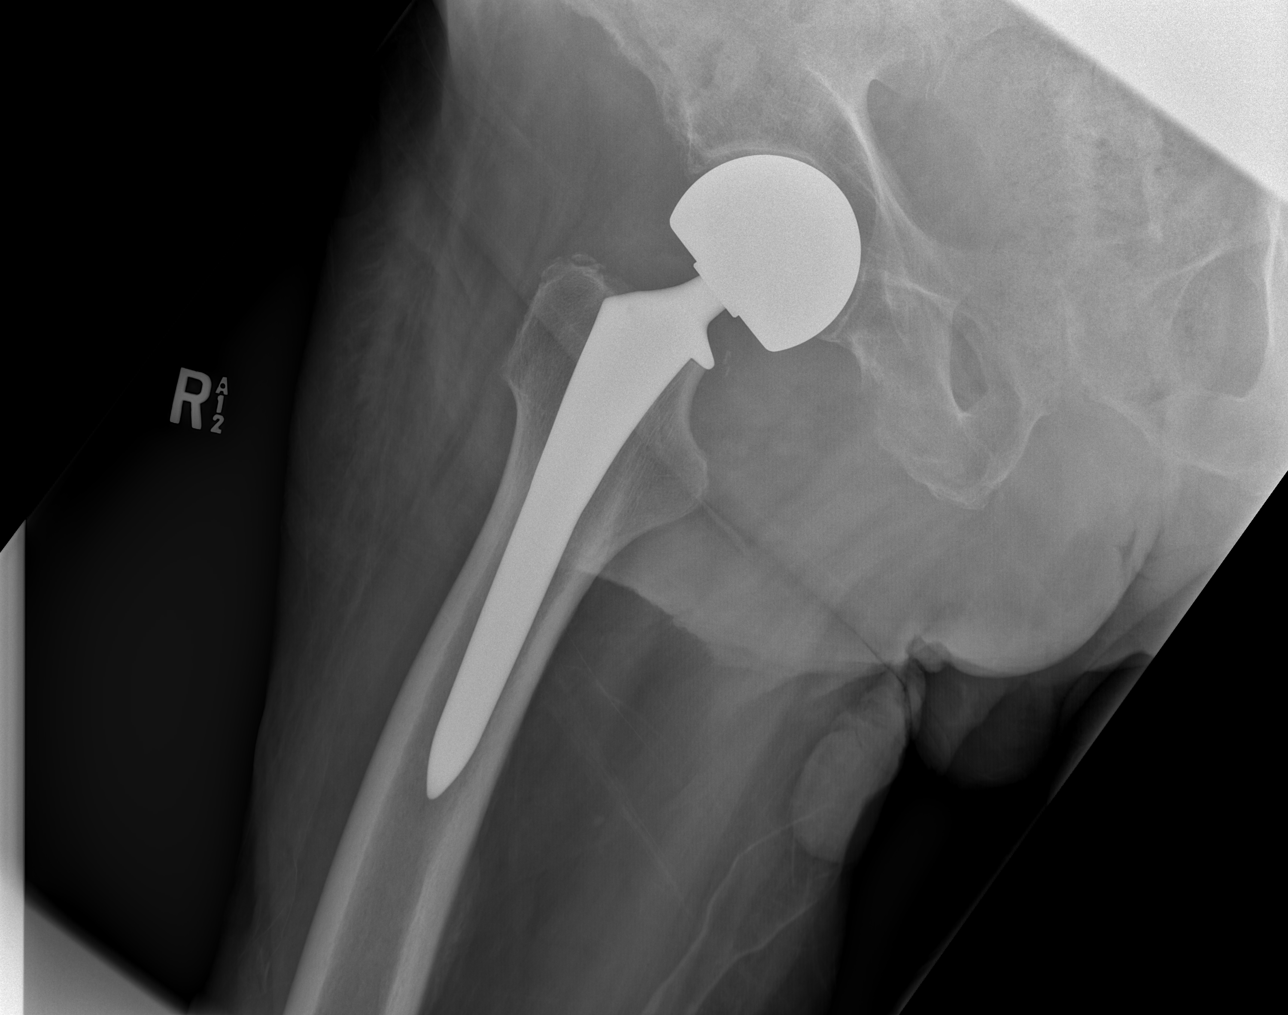

[4 of 4 positions shown; findings below may reference images not displayed]

FINDINGS: Right hip replacement is in good position. No acute fractures or
dislocations identified.
IMPRESSION: Negative.

## 2019-02-23 IMAGING — US US RENAL
1 series · 13 of 25 positions shown · non-contrast
Comparison: 08/09/2017 unenhanced CT abdomen/pelvis.

CLINICAL DATA: Acute kidney injury.

EXAM:
RENAL / URINARY TRACT ULTRASOUND COMPLETE

[Series 1: us renal · 0.23mm/px · 13 of 61 slices shown]
[im 1/61]
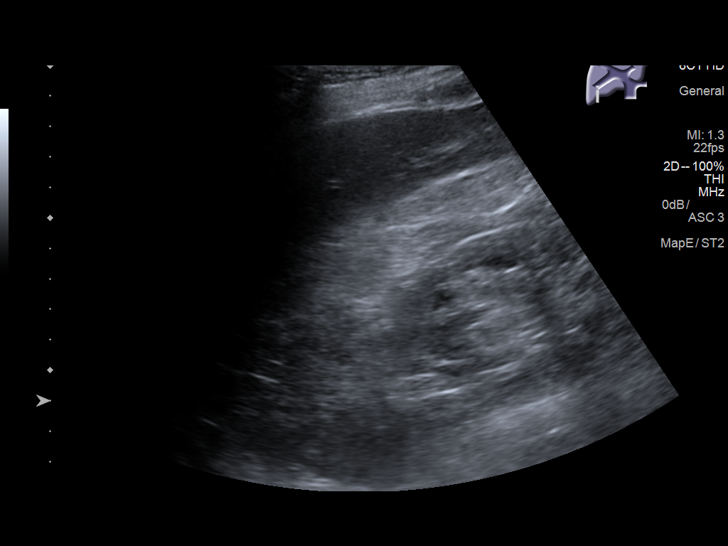
[im 6/61]
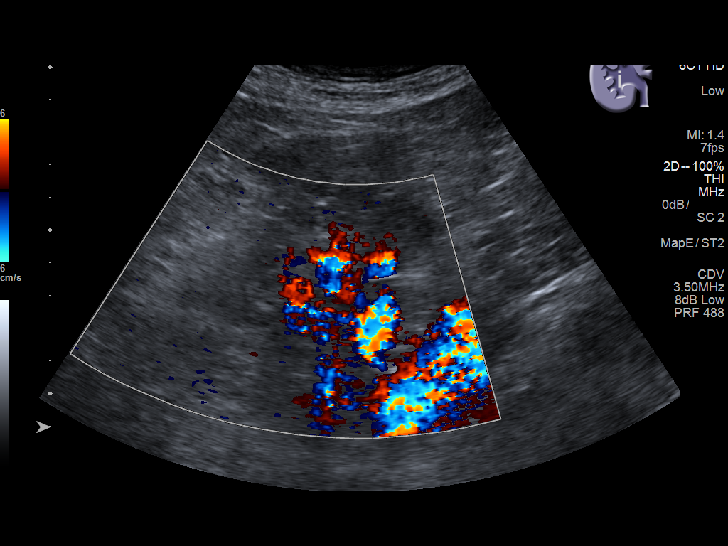
[im 11/61]
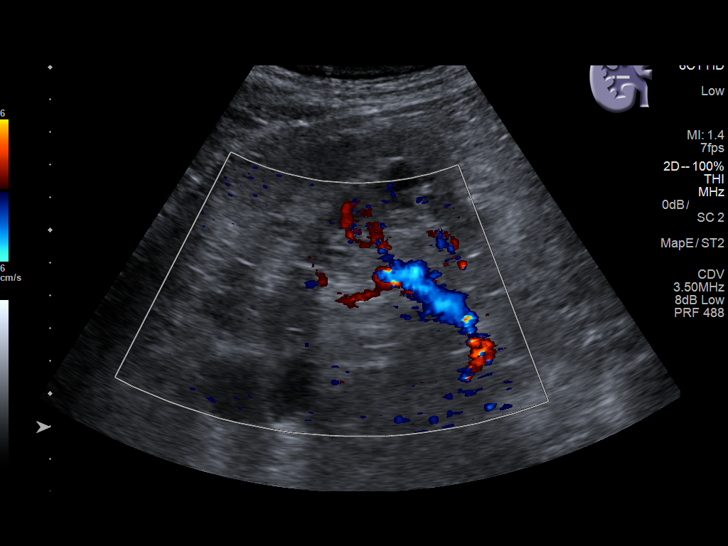
[im 16/61]
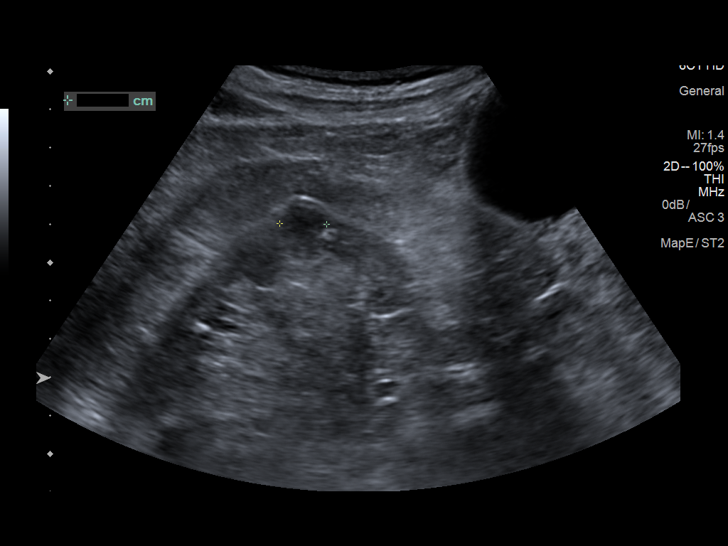
[im 21/61]
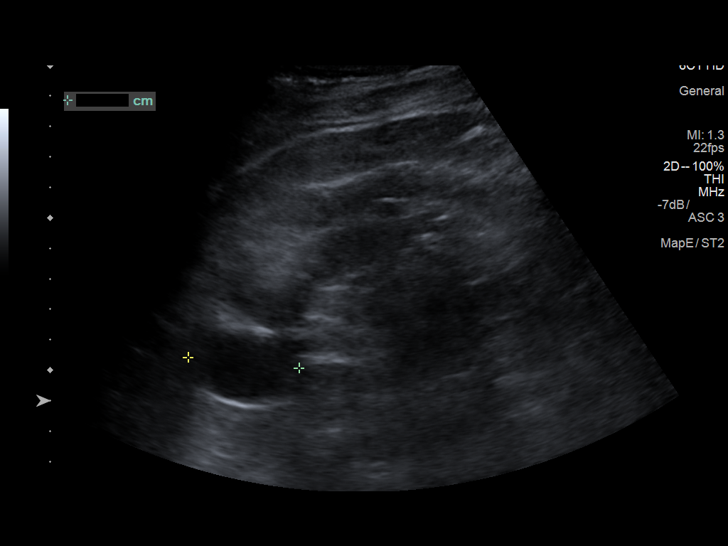
[im 26/61]
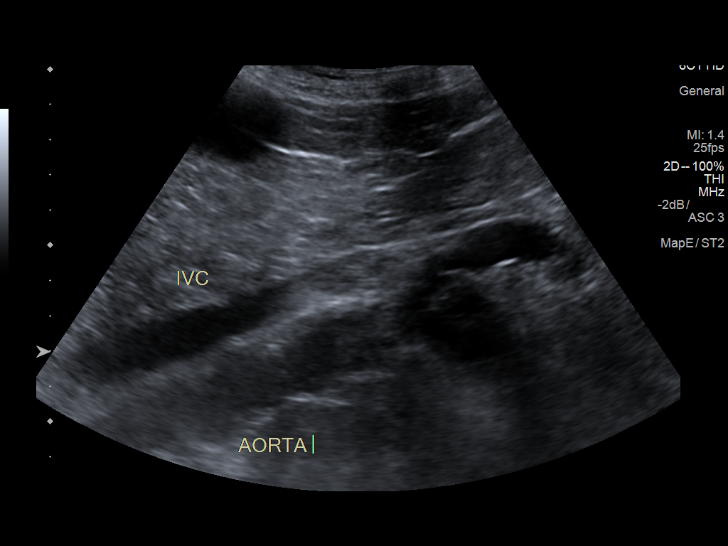
[im 31/61]
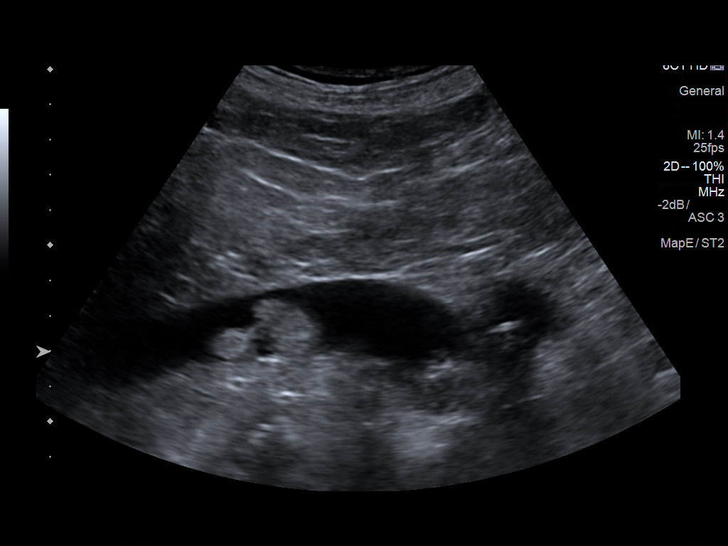
[im 36/61]
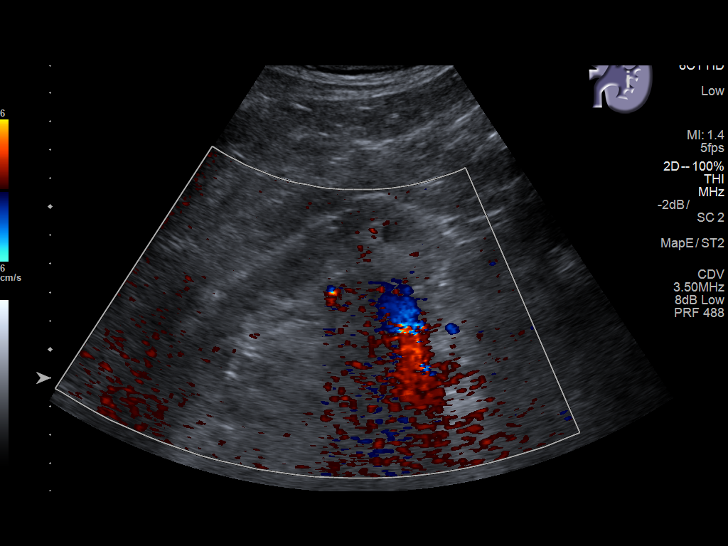
[im 41/61]
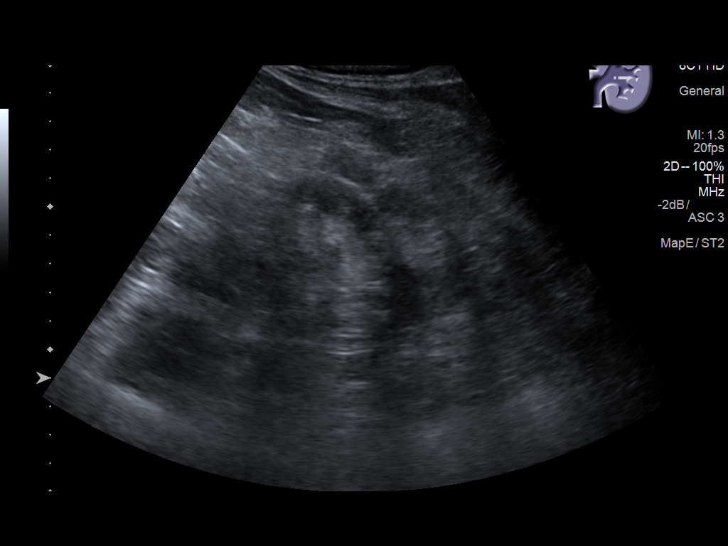
[im 46/61]
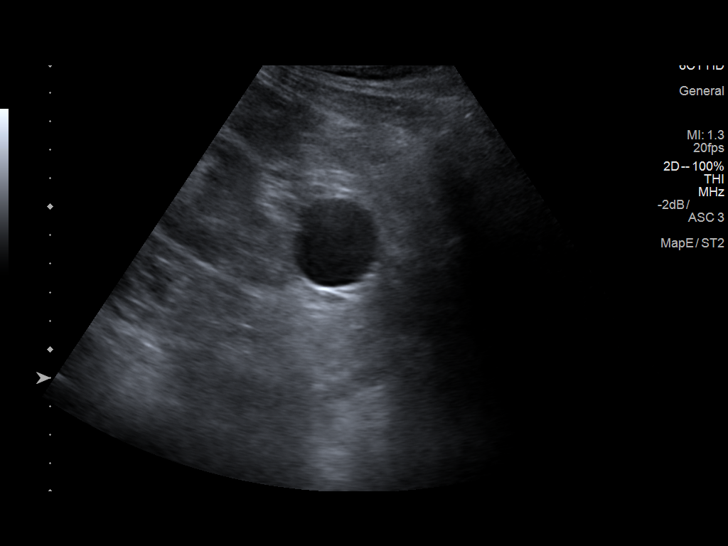
[im 51/61]
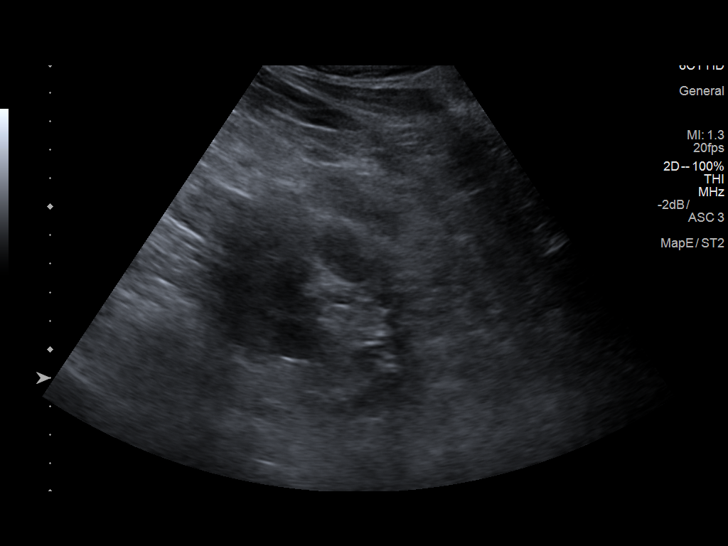
[im 56/61]
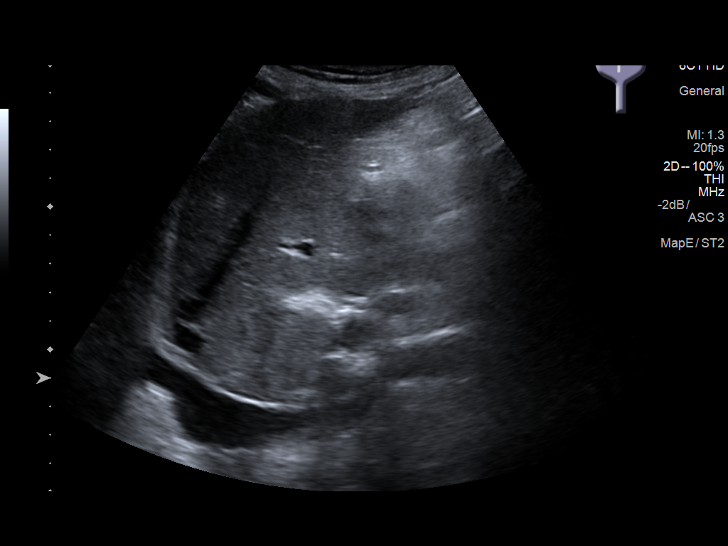
[im 61/61]
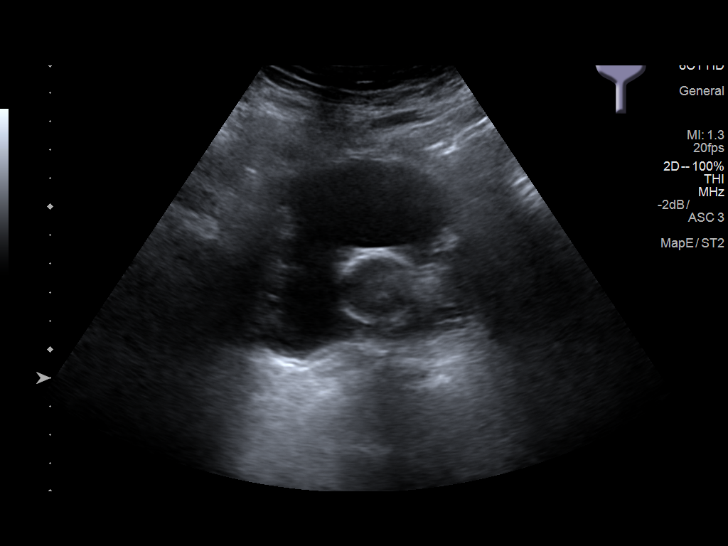

[13 of 25 positions shown; findings below may reference images not displayed]

FINDINGS: Right Kidney:

Length: 11.8 cm. Echogenic and mildly atrophic right renal
parenchyma. No right hydronephrosis. Simple exophytic 2.6 x 2.6 x
3.7 cm upper right renal cyst. Additional simple 1.2 cm interpolar
right renal cyst. Incidentally noted nonocclusive thrombosis of IVC
at the level of the right renal vein with IVC thrombus measuring up
to 3.1 x 1.7 cm.

Left Kidney:

Length: 11.4 cm. Echogenic and mildly atrophic left renal
parenchyma. No left hydronephrosis. Simple exophytic 4.0 x 3.2 x
cm lower left renal cyst. Limited visualization of additional simple
appearing 2.1 cm renal cyst in upper left kidney.

Bladder:

Foley catheter is in place within minimally distended urinary
bladder. Suggestion of mild diffuse bladder wall thickening and
trabeculation.
IMPRESSION: 1. Incidentally noted nonocclusive thrombosis of the inferior vena
cava at the level of the right renal vein.
2. No hydronephrosis.
3. Echogenic mildly atrophic kidneys, compatible with acute on
chronic nonspecific renal parenchymal disease.
4. Simple bilateral renal cysts.
5. Foley catheter in place within the minimally distended urinary
bladder. Suggestion of mild diffuse bladder wall thickening and
trabeculation, probably due to chronic bladder outlet obstruction.
